# Patient Record
Sex: Male | Born: 1960 | Race: Black or African American | Hispanic: No | Marital: Single
Health system: Midwestern US, Community
[De-identification: ages and names within clinical notes are randomized; demographics above are authoritative.]

## PROBLEM LIST (undated history)

## (undated) DIAGNOSIS — I10 Essential (primary) hypertension: Secondary | ICD-10-CM

## (undated) DIAGNOSIS — F329 Major depressive disorder, single episode, unspecified: Secondary | ICD-10-CM

## (undated) DIAGNOSIS — E785 Hyperlipidemia, unspecified: Secondary | ICD-10-CM

## (undated) DIAGNOSIS — G629 Polyneuropathy, unspecified: Secondary | ICD-10-CM

## (undated) DIAGNOSIS — T7840XA Allergy, unspecified, initial encounter: Secondary | ICD-10-CM

## (undated) DIAGNOSIS — F429 Obsessive-compulsive disorder, unspecified: Secondary | ICD-10-CM

## (undated) DIAGNOSIS — F32A Depression, unspecified: Secondary | ICD-10-CM

## (undated) HISTORY — PX: CIRCUMCISION: SUR203

## (undated) HISTORY — DX: Obsessive-compulsive disorder, unspecified: F42.9

## (undated) HISTORY — DX: Allergy, unspecified, initial encounter: T78.40XA

## (undated) HISTORY — DX: Hyperlipidemia, unspecified: E78.5

---

## 2004-08-30 ENCOUNTER — Emergency Department (HOSPITAL_COMMUNITY): Admission: EM | Admit: 2004-08-30 | Discharge: 2004-08-30 | Payer: Self-pay | Admitting: Emergency Medicine

## 2004-09-01 ENCOUNTER — Ambulatory Visit: Payer: Self-pay | Admitting: Family Medicine

## 2004-09-05 ENCOUNTER — Ambulatory Visit: Payer: Self-pay | Admitting: Family Medicine

## 2005-03-06 ENCOUNTER — Ambulatory Visit: Payer: Self-pay | Admitting: Internal Medicine

## 2005-03-20 ENCOUNTER — Inpatient Hospital Stay (HOSPITAL_COMMUNITY): Admission: RE | Admit: 2005-03-20 | Discharge: 2005-03-29 | Payer: Self-pay | Admitting: Psychiatry

## 2005-03-20 ENCOUNTER — Ambulatory Visit: Payer: Self-pay | Admitting: Psychiatry

## 2005-06-23 ENCOUNTER — Inpatient Hospital Stay (HOSPITAL_COMMUNITY): Admission: EM | Admit: 2005-06-23 | Discharge: 2005-06-29 | Payer: Self-pay | Admitting: Psychiatry

## 2005-06-23 ENCOUNTER — Ambulatory Visit: Payer: Self-pay | Admitting: Psychiatry

## 2006-03-14 ENCOUNTER — Ambulatory Visit: Payer: Self-pay | Admitting: Family Medicine

## 2006-04-02 ENCOUNTER — Emergency Department (HOSPITAL_COMMUNITY): Admission: EM | Admit: 2006-04-02 | Discharge: 2006-04-02 | Payer: Self-pay | Admitting: Emergency Medicine

## 2006-05-02 ENCOUNTER — Ambulatory Visit: Payer: Self-pay | Admitting: Family Medicine

## 2006-05-13 ENCOUNTER — Emergency Department (HOSPITAL_COMMUNITY): Admission: EM | Admit: 2006-05-13 | Discharge: 2006-05-13 | Payer: Self-pay | Admitting: *Deleted

## 2007-03-17 ENCOUNTER — Other Ambulatory Visit: Payer: Self-pay | Admitting: Emergency Medicine

## 2007-03-17 ENCOUNTER — Other Ambulatory Visit: Payer: Self-pay

## 2007-03-18 ENCOUNTER — Ambulatory Visit: Payer: Self-pay | Admitting: Psychiatry

## 2007-03-18 ENCOUNTER — Inpatient Hospital Stay (HOSPITAL_COMMUNITY): Admission: AD | Admit: 2007-03-18 | Discharge: 2007-03-25 | Payer: Self-pay | Admitting: Psychiatry

## 2007-03-18 ENCOUNTER — Other Ambulatory Visit: Payer: Self-pay | Admitting: Emergency Medicine

## 2007-10-21 ENCOUNTER — Emergency Department (HOSPITAL_COMMUNITY): Admission: EM | Admit: 2007-10-21 | Discharge: 2007-10-21 | Payer: Self-pay | Admitting: Emergency Medicine

## 2009-05-01 LAB — GENERAL HEALTH PANEL

## 2010-02-23 DIAGNOSIS — F32A Depression, unspecified: Secondary | ICD-10-CM | POA: Insufficient documentation

## 2010-02-26 DIAGNOSIS — I10 Essential (primary) hypertension: Secondary | ICD-10-CM | POA: Diagnosis present

## 2010-02-26 DIAGNOSIS — G4733 Obstructive sleep apnea (adult) (pediatric): Secondary | ICD-10-CM | POA: Insufficient documentation

## 2010-02-26 DIAGNOSIS — E669 Obesity, unspecified: Secondary | ICD-10-CM | POA: Insufficient documentation

## 2010-02-26 DIAGNOSIS — E1142 Type 2 diabetes mellitus with diabetic polyneuropathy: Secondary | ICD-10-CM | POA: Insufficient documentation
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
## 2010-12-30 NOTE — H&P (Signed)
[name], [name]                 ACCOUNT NO.:  [id number]   MEDICAL RECORD NO.:  [id number]          PATIENT TYPE:  IPS   LOCATION:  0303                          FACILITY:  BH   PHYSICIAN:  Anselm Jungling, MD  DATE OF BIRTH:  May 26, 1961   DATE OF ADMISSION:  06/23/2005  DATE OF DISCHARGE:                         PSYCHIATRIC ADMISSION ASSESSMENT   IDENTIFYING INFORMATION:  This is a voluntary admission to the services of  Dr. Geralyn Flash.  This is a 50 year old single African-American male.  Per [facility], he was found at the [facility].  He had called 911  and reported being suicidal.  He stated that this happens when he gets off  his Paxil.  He admitted to having visual hallucinations but not auditory  hallucinations yesterday.  He was brought to the emergency room at [facility] for medical clearance.  His UDS was negative.  His alcohol level was  less than 5.  His chemistry showed his glucose to be elevated at 140 and his  AST was slightly elevated at 39 and SGPT was also slightly elevated at 41.  The patient states he came here from the [place] area approximately a  year ago.  He came here to get a part-time job.  He has been on SSI for his  mental illness.  He was described as being OCD back in 1994.  He has had a  number of prior inpatient hospitalizations at a variety of institutions  including back in 2005, he was at the [place], [facility] as well as  [facility], a year ago he was at mental health in [place]  and he was an outpatient at the [facility] there and, in this past year,  he has been followed sporadically at [facility] and he  has had one prior admission here at the [facility] and that  was from August 7th to August 16th.   SOCIAL HISTORY:  He states that he graduated high school in 1984.  He began  receiving SSI for mental disability in 1994.  He denies ever being married.  He has no  children.  He states most of his family is in [place], [place].  He acknowledges that he has one brother who used to use drugs but  he thinks he is off of them now.  He, himself, denies any history for  alcohol or drug use.   PRIMARY CARE PHYSICIAN:  He currently does not have one.   MEDICAL PROBLEMS:  He is obese.  He acknowledges having back pain at times.   MEDICATIONS:  When he was here in August, his discharge medications included  Paxil CR 12.5 mg p.o. q.d., Depakote ER 1000 mg b.i.d. and Seroquel 400 mg  q.h.s.  The patient acknowledges that he never had his post-discharge  medications filled and basically has been noncompliant with medications for  the past four months.   ALLERGIES:  He has no known drug allergies.   POSITIVE PHYSICAL FINDINGS:  He is obese.  He weighs 228 pounds.  He is 5  feet 9 inches.  Temperature is 97.3, blood pressure is 134/94, 123/84, pulse  is 90, respirations are 20.  Interestingly, he has different colored eyes.  His right eye is blue.  His left eye is brown.   MENTAL STATUS EXAM:  He is alert and oriented x3.  He is appropriately  groomed, dressed and appears adequately nourished.  His speech is a normal  rate, rhythm and tone.  His mood is euthymic.  His affect is congruent.  Has  a good range.  Thought processes are clear, logical and goal-oriented.  He  realizes that he probably needs to get into a more formal housing  arrangement and brings up the option of a group home or a family home  himself.  Judgment and insight are fair.  Concentration and memory are  intact.  Intelligence is at least average. He states, today, that the  suicidal ideation has decreased although it has not yet gone.  He denies  homicidal ideation and he absolutely denies auditory or visual  hallucinations.   DIAGNOSES:  AXIS I:  Major depressive disorder, recurrent, severe.  Noncompliant with medications.  Obsessive-compulsive disorder.  Has a  handwashing  ritual.  AXIS II:  Deferred.  AXIS III:  Obesity, back pain, and his glucose level was noted to be  elevated on admission.  We will have to evaluate that.  AXIS IV:  Severe (he has limited income and no housing).  AXIS V:  35.   PLAN:  To admit for stabilization.  To restart medications.  To adjust as  indicated.  To have the social worker work with him regarding placement.      Mickie Leonarda Salon, P.A.-C.      Anselm Jungling, MD  Electronically Signed    MD/MEDQ  D:  06/23/2005  T:  06/23/2005  Job:  [id number]

## 2010-12-30 NOTE — Discharge Summary (Signed)
[name], CHOQUETTE                 ACCOUNT NO.:  [id number]   MEDICAL RECORD NO.:  [id number]          PATIENT TYPE:  IPS   LOCATION:  0303                          FACILITY:  BH   PHYSICIAN:  Anselm Jungling, MD  DATE OF BIRTH:  May 26, 1961   DATE OF ADMISSION:  06/23/2005  DATE OF DISCHARGE:  06/29/2005                                 DISCHARGE SUMMARY   IDENTIFYING DATA/REASON FOR ADMISSION:  This was the second [facility] admission  for [name], a 50 year old single African-American male who was admitted on a  voluntary basis. He was found at the [facility] where he had called 9-1-  1 and reported being suicidal. He explained that this happened because he  got off his Paxil. He reported visual but not auditory hallucinations. He  was brought to the emergency room at [facility] for medical clearance where  his UDS was negative. From there, he was brought to the inpatient  psychiatric service for further treatment. Please refer to the admission  note for further details pertaining to the symptoms, circumstances and  history that led to his hospitalization.   INITIAL DIAGNOSTIC IMPRESSION:  He was given an initial AXIS I diagnosis of  major depressive disorder, recurrent, severe, and rule out obsessive-  compulsive disorder.   He came to [place] on a regimen that involved Depakote, Paxil, and Ativan.   MEDICAL/LABORATORY:  The patient has obesity, back pain, and appeared to  have some glucose intolerance upon laboratory evaluation. He was given a  modified carbohydrate diet for this. There were no significant medical  issues otherwise.   HOSPITAL COURSE:  The patient was admitted to the adult inpatient  psychiatric service. He participated in various therapeutic groups and  activities, with variable levels of interest, motivation, and at times  refused to attend groups for no specific reason.   He was moderately irritable upon admission, and did not have any overt signs  or symptoms  of psychosis or thought disorder, despite the above referenced  complaints that he made at the time of admission. He was not reporting or  exhibiting any suicidal thoughts or behavior throughout his inpatient stay.   He was continued on Depakote ER 500 mg daily and Ativan 0.5 mg p.r.n. We  discussed a trial of Cymbalta to address depressive symptoms, to be taken  instead of Paxil. After several days, he indicated that he felt that he  needed to have his obsessive-compulsive symptoms addressed and, to this end,  we discontinued the trial of Cymbalta in favor of a trial of Zoloft, because  of Zoloft's greater antiobsessional properties. He tolerated a dose of  Zoloft 50 mg daily fairly well.   The patient was discharged on the seventh hospital day. He was in good  spirits. He was tolerating his medications. His sleep had stabilized, and  his mood was essentially neutral.   AFTERCARE:  The patient was to follow up with Dr. Derrek Gu on July 04, 2005.   DISCHARGE MEDICATIONS:  1.  Zoloft 50 mg daily.  2.  Depakote ER 500 mg daily.  3.  Ativan 0.5 mg p.r.n. anxiety up to twice a day.   DISCHARGE DIAGNOSES:  AXIS I:  Major depressive disorder, recurrent.  Obsessive compulsive disorder.  AXIS II:  Deferred.  AXIS III:  Mild glucose intolerance.  AXIS IV:  Stressors:  severe.  AXIS V:  GAF on discharge 65.           ______________________________  Anselm Jungling, MD  Electronically Signed     SPB/MEDQ  D:  06/30/2005  T:  06/30/2005  Job:  [id number]

## 2010-12-30 NOTE — Discharge Summary (Signed)
NAMESHLOMA, ROGGENKAMP                 ACCOUNT NO.:  [id number]   MEDICAL RECORD NO.:  [id number]          PATIENT TYPE:  IPS   LOCATION:  0404                          FACILITY:  [facility]   PHYSICIAN:  Anselm Jungling, MD  DATE OF BIRTH:  1960/12/06   DATE OF ADMISSION:  03/20/2005  DATE OF DISCHARGE:  03/29/2005                                 DISCHARGE SUMMARY   IDENTIFYING DATA/REASON FOR ADMISSION:  This was the first [facility] admission for  [name], a 50 year old male who was admitted due to increasing depression,  irritability, and suicidal ideation.  He had been receiving treatment at  [facility].  He had been taking Paxil CR 12.5 mg daily.  He came to [place] without any past history of polysubstance abuse.  Please refer  to the admission note for further details pertaining to the symptoms,  circumstances and history that led to his hospitalization at [facility].   INITIAL DIAGNOSTIC IMPRESSION:  AXIS I:  Depressive disorder not otherwise  specified.  Rule out bipolar affective disorder.  AXIS II:  Deferred.  AXIS III:  No acute or chronic illnesses.  AXIS IV:  Stressors:  Severe.  AXIS V:  GAF 35.   MEDICAL/LABORATORY:  A chemistry panel and urinalysis were within normal  limits.  CBC was within normal limits with the exception of reduced  hematocrit of 38.8.  TSH was within normal limits.  A toxicology screen was  negative.   HOSPITAL COURSE:  The patient was admitted to the adult inpatient  psychiatric service.  He was continued on Paxil CR 12.5 mg daily.  To  address mood instability and irritability, he was begun on a trial of  Depakote ER 250 mg b.i.d., which was subsequently increased stepwise to an  ultimate dose of 1000 mg b.i.d.  Seroquel 100 mg q.h.s. was initiated as  well to address agitation and irritability and was increased in a stepwise  fashion to an ultimate dose of 400 mg q.h.s.  Rozerem 8 mg q.h.s. was  initiated to address insomnia, without success, so it  was not continued.  Ativan 1 mg b.i.d. was also useful in addressing intermittent agitation and  anxiety.   During most of his 10-day hospital stay, the patient remained in bed,  preferring to withdraw and not participate in unit groups and activities.  He complained that he was extremely irritable.  Indeed, he was irritable  with the nursing staff, although he was generally polite and appropriate  with the undersigned.  On one occasion during his inpatient stay, he made  some sexually inappropriate behaviors towards a male peer.  Strong verbal  limits were set with the patient, which he then followed thereafter.   As his hospital course progressed, he became gradually less irritable.  His  participation remained variable and generally poor.   On the 10th hospital day, the patient was absent any acute symptoms.  He  appeared to be improved overall.  He was tolerating his medication regimen.  He agreed to discharge.   AFTERCARE:  The patient was to call [name]  to receive assistance with  housing and follow-up appointments at [facility] in [place],  [place].   DISCHARGE MEDICATIONS:  1.  Paxil CR 12.5 mg daily.  2.  Depakote ER 1000 mg b.i.d.  3.  Seroquel 400 mg q.h.s.   DISCHARGE DIAGNOSES:  AXIS I:  Bipolar affective disorder, not otherwise  specified.  AXIS II:  Deferred.  AXIS III:  No acute or chronic illnesses.  AXIS IV:  Stressors:  Severe.  AXIS V:  GAF on discharge 55.           ______________________________  Anselm Jungling, MD  Electronically Signed     SPB/MEDQ  D:  04/20/2005  T:  04/21/2005  Job:  [id number]

## 2011-01-30 ENCOUNTER — Emergency Department (HOSPITAL_COMMUNITY)
Admission: EM | Admit: 2011-01-30 | Discharge: 2011-01-30 | Disposition: A | Discharge status: Home / Self Care | Payer: Medicaid Other | Attending: Emergency Medicine | Admitting: Emergency Medicine

## 2011-01-30 ENCOUNTER — Emergency Department (HOSPITAL_COMMUNITY): Payer: Medicaid Other

## 2011-01-30 DIAGNOSIS — R059 Cough, unspecified: Secondary | ICD-10-CM | POA: Insufficient documentation

## 2011-01-30 DIAGNOSIS — E119 Type 2 diabetes mellitus without complications: Secondary | ICD-10-CM | POA: Insufficient documentation

## 2011-01-30 DIAGNOSIS — R05 Cough: Secondary | ICD-10-CM | POA: Insufficient documentation

## 2011-01-30 DIAGNOSIS — Z79899 Other long term (current) drug therapy: Secondary | ICD-10-CM | POA: Insufficient documentation

## 2011-01-30 DIAGNOSIS — F3289 Other specified depressive episodes: Secondary | ICD-10-CM | POA: Insufficient documentation

## 2011-01-30 DIAGNOSIS — F329 Major depressive disorder, single episode, unspecified: Secondary | ICD-10-CM | POA: Insufficient documentation

## 2011-01-30 DIAGNOSIS — J40 Bronchitis, not specified as acute or chronic: Secondary | ICD-10-CM | POA: Insufficient documentation

## 2011-01-30 DIAGNOSIS — I10 Essential (primary) hypertension: Secondary | ICD-10-CM | POA: Insufficient documentation

## 2011-02-15 ENCOUNTER — Emergency Department (HOSPITAL_COMMUNITY)
Admission: EM | Admit: 2011-02-15 | Discharge: 2011-02-15 | Disposition: A | Discharge status: Home / Self Care | Payer: Medicare Other | Attending: Emergency Medicine | Admitting: Emergency Medicine

## 2011-02-15 DIAGNOSIS — I10 Essential (primary) hypertension: Secondary | ICD-10-CM | POA: Insufficient documentation

## 2011-02-15 DIAGNOSIS — Z79899 Other long term (current) drug therapy: Secondary | ICD-10-CM | POA: Insufficient documentation

## 2011-02-15 DIAGNOSIS — F3289 Other specified depressive episodes: Secondary | ICD-10-CM | POA: Insufficient documentation

## 2011-02-15 DIAGNOSIS — E119 Type 2 diabetes mellitus without complications: Secondary | ICD-10-CM | POA: Insufficient documentation

## 2011-02-15 DIAGNOSIS — Z760 Encounter for issue of repeat prescription: Secondary | ICD-10-CM | POA: Insufficient documentation

## 2011-02-15 DIAGNOSIS — F329 Major depressive disorder, single episode, unspecified: Secondary | ICD-10-CM | POA: Insufficient documentation

## 2011-04-29 DIAGNOSIS — E1169 Type 2 diabetes mellitus with other specified complication: Secondary | ICD-10-CM | POA: Insufficient documentation

## 2011-05-29 LAB — CBC
HCT: 37 — ABNORMAL LOW
Hemoglobin: 12.4 — ABNORMAL LOW
MCV: 84.2
RDW: 14.1 — ABNORMAL HIGH
WBC: 9.3

## 2011-05-29 LAB — I-STAT 8, (EC8 V) (CONVERTED LAB)
Acid-base deficit: 1
Chloride: 105
Hemoglobin: 13.6
Potassium: 3.9
Sodium: 140
pH, Ven: 7.361 — ABNORMAL HIGH

## 2011-05-29 LAB — URINALYSIS, ROUTINE W REFLEX MICROSCOPIC
Bilirubin Urine: NEGATIVE
Glucose, UA: NEGATIVE
Protein, ur: NEGATIVE

## 2011-05-29 LAB — DIFFERENTIAL
Lymphs Abs: 2.6
Monocytes Absolute: 0.9 — ABNORMAL HIGH
Neutro Abs: 5.4
Neutrophils Relative %: 58

## 2011-05-29 LAB — POCT I-STAT CREATININE: Operator id: 277751

## 2011-05-29 LAB — TSH: TSH: 2.567

## 2011-05-29 LAB — HEPATIC FUNCTION PANEL

## 2011-05-29 LAB — ETHANOL: Alcohol, Ethyl (B): <5

## 2011-05-29 LAB — RAPID URINE DRUG SCREEN, HOSP PERFORMED: Opiates: NOT DETECTED

## 2011-08-28 IMAGING — CR DG CHEST 2V
2 series · 2 of 2 positions shown · non-contrast
Comparison: 04/02/2006

CLINICAL DATA: Dry cough for 1 month.  Nonsmoker.  Hypertension.
Type 2 diabetes.

CHEST - 2 VIEW

[w chest pa]
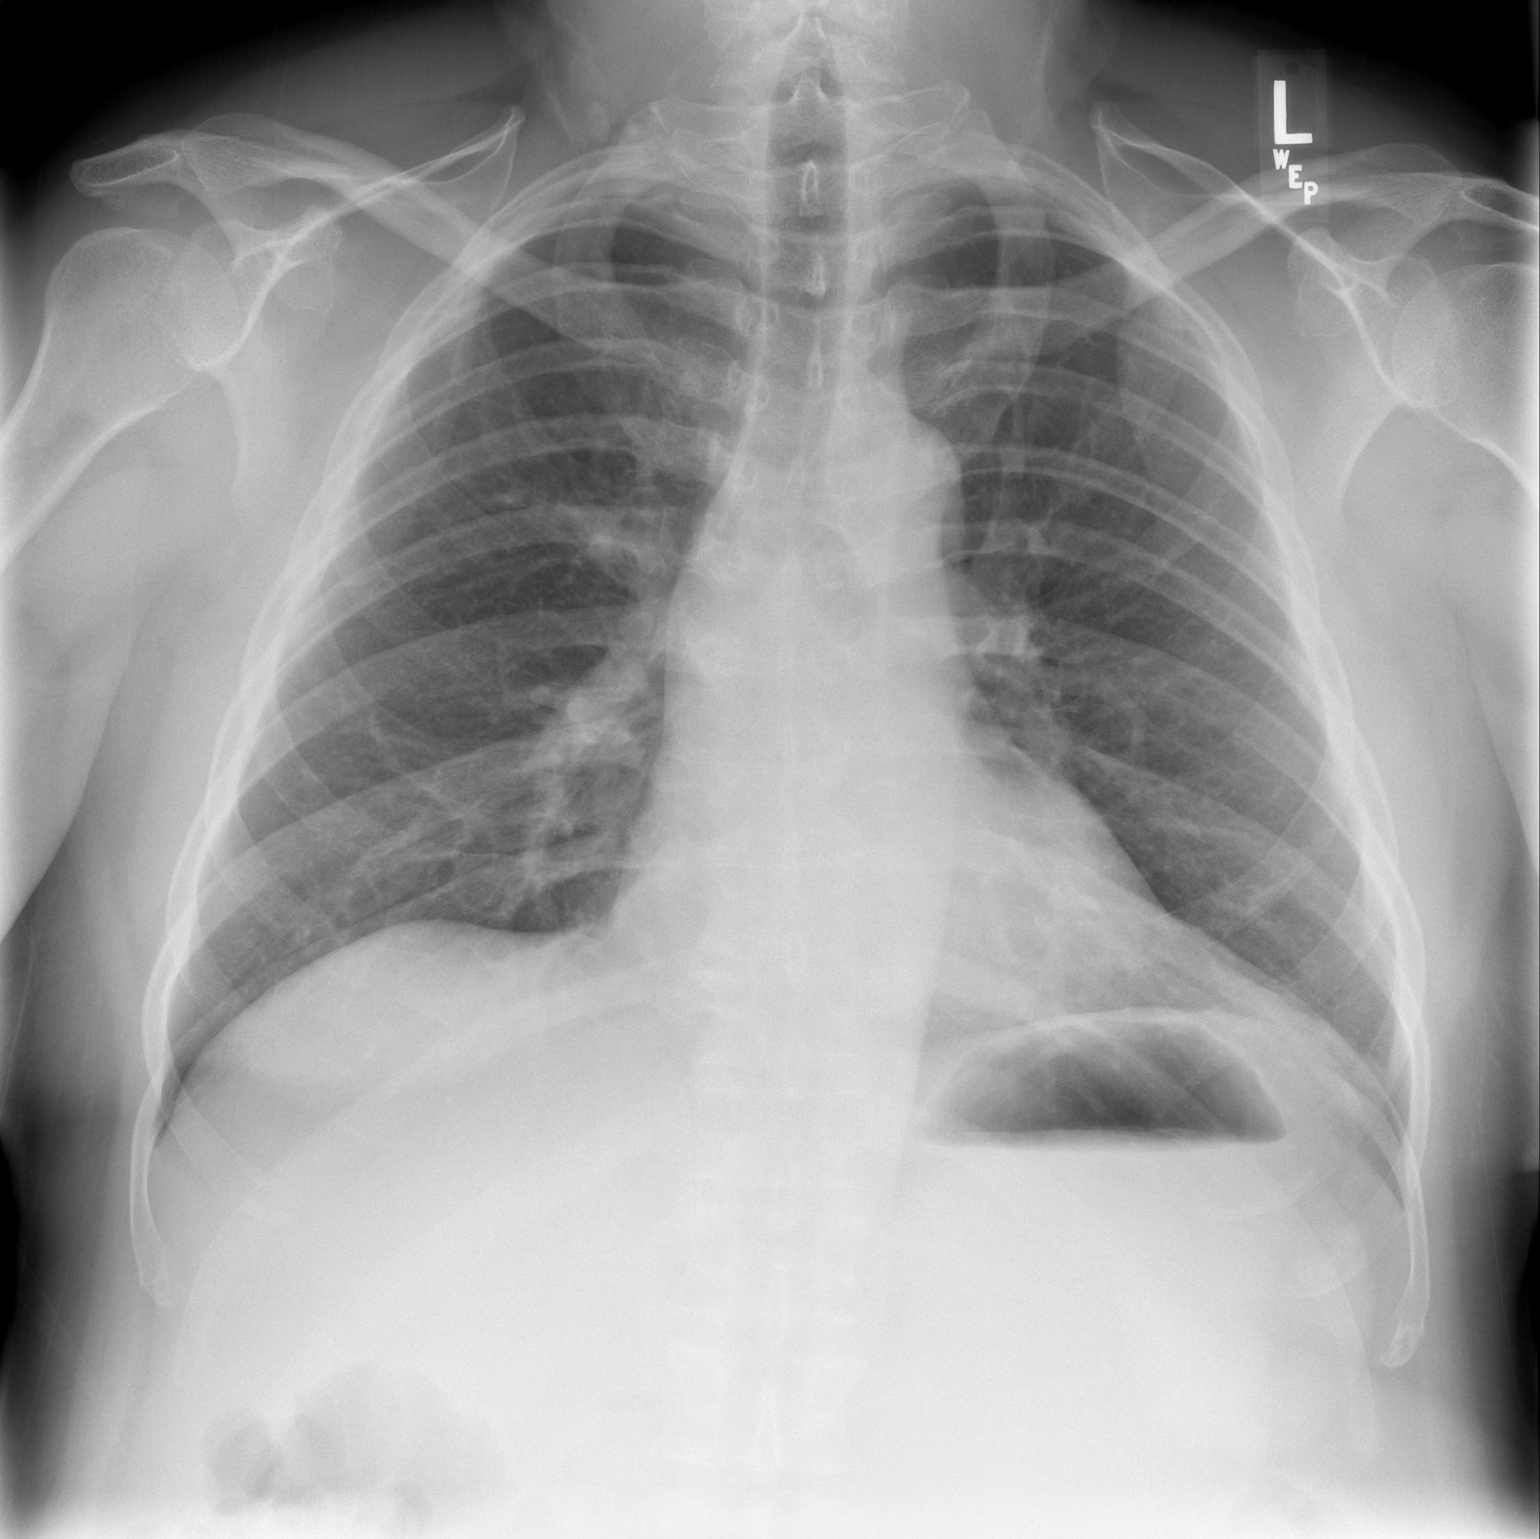

[w chest lat]
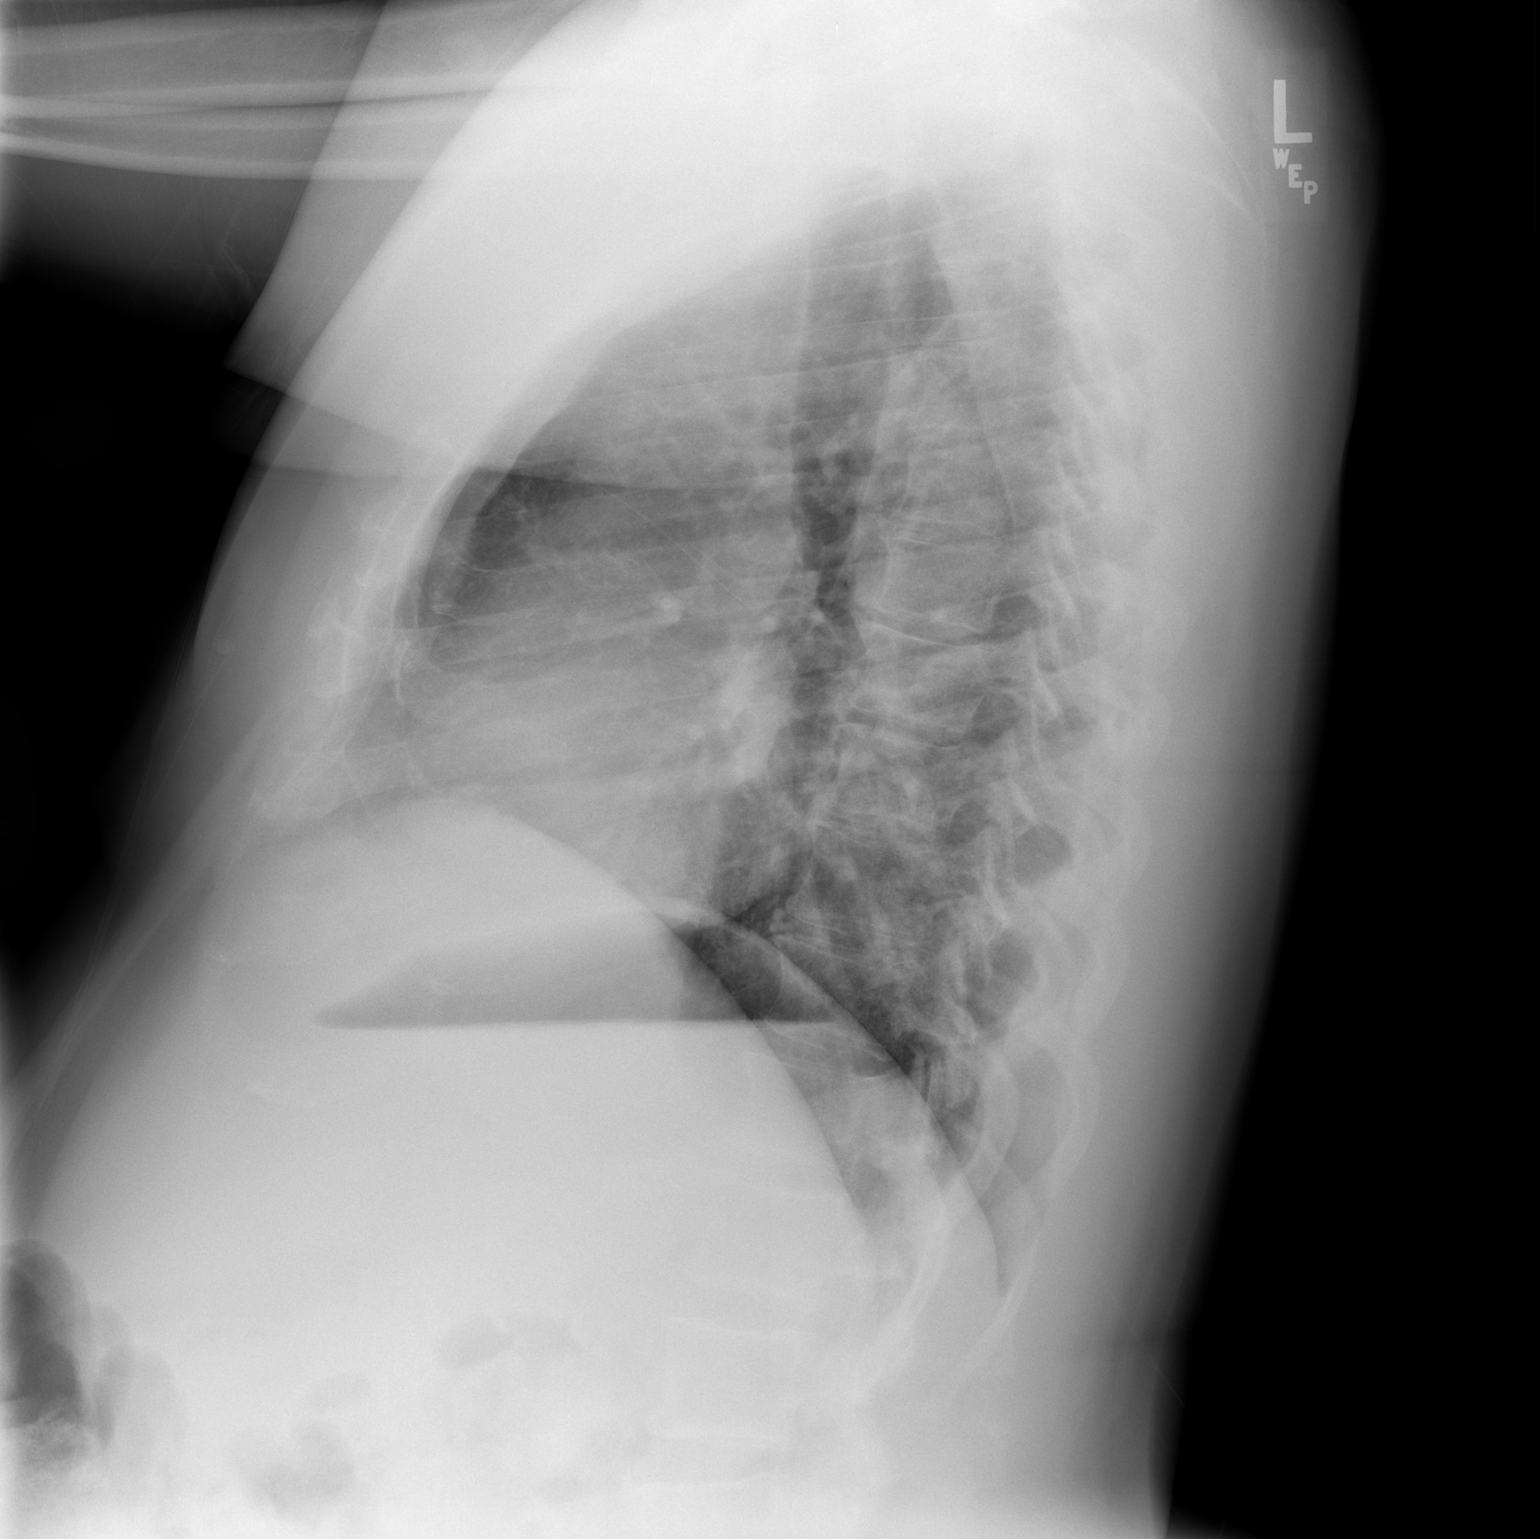

[2 of 2 positions shown; findings below may reference images not displayed]

FINDINGS: Heart and mediastinal contours are within normal limits.
The lung fields appear clear with no signs of focal infiltrate or
congestive failure.  No pleural fluid or peribronchial cuffing is
seen.

Bony structures demonstrate degenerative osteophytosis of the
thoracic spine and are otherwise intact.  A well-circumscribed
radiographic density is identified overlying the soft tissues of
the right neck and has a appearance suspicious for an extrinsic
button.
IMPRESSION: Stable cardiopulmonary appearance with no new focal or acute
abnormality.

## 2011-09-05 ENCOUNTER — Emergency Department (HOSPITAL_COMMUNITY)
Admission: EM | Admit: 2011-09-05 | Discharge: 2011-09-07 | Disposition: A | Discharge status: Other Acute Inpatient Hospital | Payer: Medicare Other | Source: Home / Self Care | Attending: Emergency Medicine | Admitting: Emergency Medicine

## 2011-09-05 ENCOUNTER — Encounter (HOSPITAL_COMMUNITY): Payer: Self-pay | Admitting: Emergency Medicine

## 2011-09-05 DIAGNOSIS — I10 Essential (primary) hypertension: Secondary | ICD-10-CM | POA: Insufficient documentation

## 2011-09-05 DIAGNOSIS — R45851 Suicidal ideations: Secondary | ICD-10-CM | POA: Insufficient documentation

## 2011-09-05 DIAGNOSIS — E119 Type 2 diabetes mellitus without complications: Secondary | ICD-10-CM | POA: Insufficient documentation

## 2011-09-05 DIAGNOSIS — R4589 Other symptoms and signs involving emotional state: Secondary | ICD-10-CM

## 2011-09-05 DIAGNOSIS — G479 Sleep disorder, unspecified: Secondary | ICD-10-CM | POA: Insufficient documentation

## 2011-09-05 HISTORY — DX: Polyneuropathy, unspecified: G62.9

## 2011-09-05 HISTORY — DX: Essential (primary) hypertension: I10

## 2011-09-05 LAB — DIFFERENTIAL
Basophils Relative: 0 % (ref 0–1)
Eosinophils Relative: 2 % (ref 0–5)
Lymphocytes Relative: 29 % (ref 12–46)
Lymphs Abs: 2.6 10*3/uL (ref 0.7–4.0)
Monocytes Absolute: 0.7 10*3/uL (ref 0.1–1.0)

## 2011-09-05 LAB — CBC
HCT: 37.4 % — ABNORMAL LOW (ref 39.0–52.0)
Hemoglobin: 13.1 g/dL (ref 13.0–17.0)
MCH: 28 pg (ref 26.0–34.0)
MCV: 79.9 fL (ref 78.0–100.0)
WBC: 9 10*3/uL (ref 4.0–10.5)

## 2011-09-05 LAB — BASIC METABOLIC PANEL
GFR calc Af Amer: >90 mL/min (ref >90)
GFR calc non Af Amer: >90 mL/min (ref >90)
Sodium: 138 mEq/L (ref 135–145)

## 2011-09-05 NOTE — ED Notes (Signed)
PT. REPORTS SUICIDAL IDEATION , PLANS TO CROSS HIGHWAY AND TO BE HIT BY A TRUCK.

## 2011-09-06 LAB — GLUCOSE, CAPILLARY
Glucose-Capillary: 127 mg/dL — ABNORMAL HIGH (ref 70–99)
Glucose-Capillary: 140 mg/dL — ABNORMAL HIGH (ref 70–99)

## 2011-09-06 LAB — RAPID URINE DRUG SCREEN, HOSP PERFORMED
Barbiturates: NOT DETECTED
Benzodiazepines: NOT DETECTED
Opiates: NOT DETECTED

## 2011-09-06 MED ORDER — TRAZODONE HCL 150 MG PO TABS
150.0000 mg | ORAL_TABLET | ORAL | Status: DC
  Filled 2011-09-06: qty 1

## 2011-09-06 MED ORDER — INSULIN ASPART 100 UNIT/ML ~~LOC~~ SOLN
0.0000 [IU] | Freq: Three times a day (TID) | SUBCUTANEOUS | Status: DC
  Administered 2011-09-06: 2 [IU] via SUBCUTANEOUS
  Administered 2011-09-06 (×2): 0 [IU] via SUBCUTANEOUS
  Filled 2011-09-06 (×2): qty 1

## 2011-09-06 MED ORDER — IBUPROFEN 200 MG PO TABS: 600.0000 mg | ORAL_TABLET | Freq: Three times a day (TID) | ORAL | Status: DC | PRN

## 2011-09-06 MED ORDER — TRAZODONE HCL 150 MG PO TABS
150.0000 mg | ORAL_TABLET | Freq: Every day | ORAL | Status: DC
  Administered 2011-09-06: 150 mg via ORAL
  Filled 2011-09-06: qty 1

## 2011-09-06 MED ORDER — TRAZODONE HCL 150 MG PO TABS: 150.0000 mg | ORAL_TABLET | Freq: Every day | ORAL | Status: DC

## 2011-09-06 MED ORDER — ZOLPIDEM TARTRATE 5 MG PO TABS
5.0000 mg | ORAL_TABLET | Freq: Every evening | ORAL | Status: DC | PRN
  Filled 2011-09-06: qty 1

## 2011-09-06 MED ORDER — METFORMIN HCL 500 MG PO TABS: 500.0000 mg | ORAL_TABLET | Freq: Two times a day (BID) | ORAL | Status: DC

## 2011-09-06 MED ORDER — PREGABALIN 75 MG PO CAPS
75.0000 mg | ORAL_CAPSULE | Freq: Three times a day (TID) | ORAL | Status: DC
  Administered 2011-09-06: 75 mg via ORAL

## 2011-09-06 MED ORDER — ONDANSETRON HCL 8 MG PO TABS: 4.0000 mg | ORAL_TABLET | Freq: Three times a day (TID) | ORAL | Status: DC | PRN

## 2011-09-06 MED ORDER — METFORMIN HCL 500 MG PO TABS
500.0000 mg | ORAL_TABLET | ORAL | Status: AC
  Administered 2011-09-06: 500 mg via ORAL
  Filled 2011-09-06: qty 1

## 2011-09-06 MED ORDER — METOPROLOL TARTRATE 25 MG PO TABS
100.0000 mg | ORAL_TABLET | Freq: Every day | ORAL | Status: DC
  Administered 2011-09-06: 100 mg via ORAL

## 2011-09-06 MED ORDER — LORAZEPAM 1 MG PO TABS
1.0000 mg | ORAL_TABLET | Freq: Three times a day (TID) | ORAL | Status: DC | PRN
  Administered 2011-09-06 (×2): 1 mg via ORAL
  Filled 2011-09-06 (×2): qty 1

## 2011-09-06 MED ORDER — GLIPIZIDE 10 MG PO TABS
10.0000 mg | ORAL_TABLET | ORAL | Status: AC
  Administered 2011-09-06: 10 mg via ORAL
  Filled 2011-09-06: qty 1

## 2011-09-06 MED ORDER — PREGABALIN 75 MG PO CAPS
75.0000 mg | ORAL_CAPSULE | ORAL | Status: DC
  Filled 2011-09-06: qty 1

## 2011-09-06 MED ORDER — PREGABALIN 75 MG PO CAPS
75.0000 mg | ORAL_CAPSULE | ORAL | Status: AC
Start: 2011-09-06 — End: 2011-09-06
  Administered 2011-09-06: 75 mg via ORAL
  Filled 2011-09-06: qty 1

## 2011-09-06 MED ORDER — METOPROLOL TARTRATE 25 MG PO TABS
100.0000 mg | ORAL_TABLET | Freq: Two times a day (BID) | ORAL | Status: DC
  Administered 2011-09-06 (×2): 100 mg via ORAL
  Filled 2011-09-06 (×2): qty 4

## 2011-09-06 MED ORDER — ACETAMINOPHEN 325 MG PO TABS: 650.0000 mg | ORAL_TABLET | ORAL | Status: DC | PRN

## 2011-09-06 MED ORDER — ALUM & MAG HYDROXIDE-SIMETH 200-200-20 MG/5ML PO SUSP: 30.0000 mL | ORAL | Status: DC | PRN

## 2011-09-06 NOTE — ED Notes (Signed)
Pt ambulated to the bathroom. No current complaints. Waiting on tele psych conference.

## 2011-09-06 NOTE — ED Notes (Signed)
Received report from Katie RN.

## 2011-09-06 NOTE — ED Notes (Signed)
Pt has $23.00 on his bedside table (1-$10 bill, 1-$5 bill and 8-$1 bills), a yellow colored watch and glasses.  I attempted to place pt money and watch in locker and pt became very upset and stated that he wanted the money and watch with him.

## 2011-09-06 NOTE — ED Notes (Signed)
CBG is 140 RN [employer].

## 2011-09-06 NOTE — BH Assessment (Signed)
Assessment Note   [name] is an 51 y.o. male.  [facility] came to [facility] because he was having thoughts of killing himself.  He has felt this way for the last 3 days with today being the worst.  His plan is to jump in front of a truck.  [name] had one previous suicide attempt years ago.  He denies any HI or A/V hallucinations at this time.  [name] also denies SA issues and has a clean UDS.  [name] has no current psychiatric care and says that he gets his prescriptions refilled by going to urgent care locations.  He currently is homeless.  He currently does not feel that he would be safe on his own.  [name] is in need of inpatient psychiatric care. Axis I: Depressive Disorder NOS Axis II: Deferred Axis III:  Past Medical History  Diagnosis Date  . Hypertension   . Diabetes mellitus   . Constipation   . Neuropathy    Axis IV: housing problems and problems with primary support group Axis V: 31-40 impairment in reality testing  Past Medical History:  Past Medical History  Diagnosis Date  . Hypertension   . Diabetes mellitus   . Constipation   . Neuropathy     History reviewed. No pertinent past surgical history.  Family History: No family history on file.  Social History:  reports that he has never smoked. He does not have any smokeless tobacco history on file. He reports that he does not drink alcohol or use illicit drugs.  Additional Social History:  Alcohol / Drug Use Pain Medications: None Prescriptions: Metropolol 100 mg qd; Metformin 500 mg 2x/D; Glipicide 10 mg qd; Lyrica 75 mg 3x/D; Trazadone 150 mg qhs, Roserim (sp?) dose unknown; colace also. Over the Counter: Unknown History of alcohol / drug use?: No history of alcohol / drug abuse Longest period of sobriety (when/how long): N/A Allergies:  Allergies  Allergen Reactions  . Ace Inhibitors Other (See Comments)    cough    Home Medications:  Medications Prior to Admission  Medication Dose Route Frequency Provider Last  Rate Last Dose  . acetaminophen (TYLENOL) tablet 650 mg  650 mg Oral Q4H PRN Dione Booze, MD      . alum & mag hydroxide-simeth (MAALOX/MYLANTA) 200-200-20 MG/5ML suspension 30 mL  30 mL Oral PRN Dione Booze, MD      . glipiZIDE (GLUCOTROL) tablet 10 mg  10 mg Oral BID AC Dione Booze, MD      . ibuprofen (ADVIL,MOTRIN) tablet 600 mg  600 mg Oral Q8H PRN Dione Booze, MD      . insulin aspart (novoLOG) injection 0-15 Units  0-15 Units Subcutaneous TID WC Dione Booze, MD      . LORazepam (ATIVAN) tablet 1 mg  1 mg Oral Q8H PRN Dione Booze, MD      . metFORMIN (GLUCOPHAGE) tablet 500 mg  500 mg Oral BID WC Dione Booze, MD      . metoprolol tartrate (LOPRESSOR) tablet 100 mg  100 mg Oral BID Dione Booze, MD      . ondansetron [facility]) tablet 4 mg  4 mg Oral Q8H PRN Dione Booze, MD      . pregabalin (LYRICA) capsule 75 mg  75 mg Oral TID Dione Booze, MD      . traZODone (DESYREL) tablet 150 mg  150 mg Oral QHS Dione Booze, MD      . zolpidem [facility]) tablet 5 mg  5 mg Oral QHS PRN Onalee Hua  Preston Fleeting, MD       No current outpatient prescriptions on file as of 09/05/2011.    OB/GYN Status:  No LMP for male patient.  General Assessment Data Location of Assessment: [facility] ED Living Arrangements: Homeless Can pt return to current living arrangement?: Yes Admission Status: Voluntary Is patient capable of signing voluntary admission?: Yes Transfer from: Acute Hospital Referral Source: Self/Family/Friend     Risk to self Suicidal Ideation: Yes-Currently Present Suicidal Intent: Yes-Currently Present Is patient at risk for suicide?: Yes Suicidal Plan?: Yes-Currently Present Specify Current Suicidal Plan: Jump in front of a truck Access to Means: Yes Specify Access to Suicidal Means: Streets, highways, byways What has been your use of drugs/alcohol within the last 12 months?: Denies current use Previous Attempts/Gestures: Yes How many times?: 1  Other Self Harm Risks: None Triggers for Past Attempts:  Family contact Intentional Self Injurious Behavior: None Family Suicide History: Unknown Recent stressful life event(s): Conflict (Comment) (People wanting to fight him.  Conflict at a boarding house) Persecutory voices/beliefs?: No Depression: Yes Depression Symptoms: Despondent;Isolating;Feeling worthless/self pity;Insomnia Substance abuse history and/or treatment for substance abuse?: No Suicide prevention information given to non-admitted patients: Not applicable  Risk to Others Homicidal Ideation: No Thoughts of Harm to Others: No Current Homicidal Intent: No-Not Currently/Within Last 6 Months Current Homicidal Plan: No-Not Currently/Within Last 6 Months Access to Homicidal Means: No Identified Victim: No one History of harm to others?: No Assessment of Violence: None Noted Violent Behavior Description: None Does patient have access to weapons?: No Criminal Charges Pending?: No Does patient have a court date: No  Psychosis Hallucinations: None noted Delusions: None noted  Mental Status Report Appear/Hygiene: Disheveled Eye Contact: Good Motor Activity: Unremarkable Speech: Logical/coherent Level of Consciousness: Quiet/awake Mood: Depressed Affect: Sad Anxiety Level: None Thought Processes: Coherent;Relevant Judgement: Impaired Orientation: Person;Place;Time;Situation Obsessive Compulsive Thoughts/Behaviors: None  Cognitive Functioning Concentration: Decreased Memory: Recent Intact;Remote Intact IQ: Average Insight: Fair Impulse Control: Poor Appetite: Fair Weight Loss: 0  Weight Gain: 0  Sleep: Decreased Total Hours of Sleep:  (<6H/D w/o meds) Vegetative Symptoms: None  Prior Inpatient Therapy Prior Inpatient Therapy: Yes Prior Therapy Dates: 2008? Prior Therapy Facilty/Provider(s): [facility] Reason for Treatment: Depression/SI  Prior Outpatient Therapy Prior Outpatient Therapy: No Prior Therapy Dates: Unknown Prior Therapy Facilty/Provider(s):  Unknown Reason for Treatment: N/A  ADL Screening (condition at time of admission) Patient's cognitive ability adequate to safely complete daily activities?: Yes Patient able to express need for assistance with ADLs?: Yes Independently performs ADLs?: Yes Weakness of Legs: None Weakness of Arms/Hands: None  Home Assistive Devices/Equipment Home Assistive Devices/Equipment: None    Abuse/Neglect Assessment (Assessment to be complete while patient is alone) Physical Abuse: Denies Verbal Abuse: Yes, past (Comment) (Did not elaborate) Sexual Abuse: Denies Exploitation of patient/patient's resources: Denies Self-Neglect: Denies          Additional Information 1:1 In Past 12 Months?: No CIRT Risk: No Elopement Risk: No Does patient have medical clearance?: Yes     Disposition:  Disposition Disposition of Patient: Inpatient treatment program Type of inpatient treatment program: Adult  On Site Evaluation by:   Reviewed with Physician:  Dr. Preston Fleeting at 01:36   Beatriz Stallion Ray 09/06/2011 3:22 AM

## 2011-09-06 NOTE — ED Notes (Signed)
Pt resting comfortably. Sitter at the bedside. Pt waiting on tele psych consult. Pt had specific request for lunch but got mis ordered and the patient did not receive fried chicken as requested. Pt assured that dinner would be ordered for him and we will make sure that he receives fried chicken. Pt remains in good spirits. No current complaints.

## 2011-09-06 NOTE — ED Notes (Signed)
CBG CHECKED BY EMT R [id number]

## 2011-09-06 NOTE — ED Notes (Signed)
CBG 124 

## 2011-09-06 NOTE — ED Notes (Signed)
PT request medications to be given at 10pm. States he is not ready to be drowsy yet

## 2011-09-06 NOTE — ED Notes (Signed)
Called pharmacy. They stated ok to hold lopressor until am.

## 2011-09-06 NOTE — ED Notes (Signed)
Pt resting comfortably. Sitter at the bedside.  

## 2011-09-06 NOTE — [facility] Counselor (Signed)
Pt has been accepted to [facility] adult unit room 502-1 by Dr Lolly Mustache to Dr. Dan Humphreys.  Support paperwork has been signed and faxed to [facility].

## 2011-09-06 NOTE — ED Notes (Signed)
CBG: 113 °

## 2011-09-06 NOTE — ED Notes (Signed)
Per ACT team. Pt is to be set up for tele psych. Paperwork completed and given to secretary Darl Pikes to call and fax info to appropriate facility.

## 2011-09-06 NOTE — ED Notes (Signed)
Pt. Alert and oriented, gait steady, transferred to [facility] with security

## 2011-09-06 NOTE — ED Provider Notes (Signed)
History     CSN: [id number]  Arrival date & time 09/05/11  2214   First MD Initiated Contact with Patient 09/05/11 2258      Chief Complaint  Patient presents with  . Suicidal    (Consider location/radiation/quality/duration/timing/severity/associated sxs/prior treatment) The history is provided by the patient.   51 year old male says that he started getting suicidal thoughts about 2 or 3 days ago, and it got worse today. He stopped taking his medications when he started feeling suicidal. His suicidal plan is to jump in front of a truck. He has had crying spells and sleep disturbance and anhedonia. He denies hallucinations. He wishes to signed himself in voluntarily. Symptoms are severe. Nothing makes it better and nothing makes it worse.  Past Medical History  Diagnosis Date  . Hypertension   . Diabetes mellitus   . Constipation   . Neuropathy     History reviewed. No pertinent past surgical history.  No family history on file.  History  Substance Use Topics  . Smoking status: Never Smoker   . Smokeless tobacco: Not on file  . Alcohol Use: No      Review of Systems  All other systems reviewed and are negative.    Allergies  Ace inhibitors  Home Medications   Current Outpatient Rx  Name Route Sig Dispense Refill  . DOCUSATE SODIUM-CASANTHRANOL 100-30 MG PO CAPS Oral Take 1 capsule by mouth 2 (two) times daily as needed. For constipation    . GLIPIZIDE 10 MG PO TABS Oral Take 10 mg by mouth 2 (two) times daily before a meal.    . METFORMIN HCL 500 MG PO TABS Oral Take 500 mg by mouth 2 (two) times daily with a meal.    . METOPROLOL TARTRATE 100 MG PO TABS Oral Take 100 mg by mouth 2 (two) times daily.    Marland Kitchen PREGABALIN 75 MG PO CAPS Oral Take 75 mg by mouth 3 (three) times daily.    . TRAZODONE HCL 150 MG PO TABS Oral Take 150 mg by mouth at bedtime.      BP 156/92  Pulse 87  Temp(Src) 97.8 F (36.6 C) (Oral)  Resp 20  SpO2 95%  Physical Exam  Nursing  note and vitals reviewed.  51 year old male who is resting comfortably and in no acute distress. Vital signs show mild hypertension blood pressure 156/92. Oxygen saturation is 95% which is normal. Head is normocephalic and atraumatic. PERRLA, EOMI. Oropharynx is clear. Neck is nontender and supple without adenopathy. Back is nontender. Lungs are clear without rales, wheezes, rhonchi. Heart has regular rate and rhythm without murmur. Abdomen is soft, flat, nontender without masses or hepatosplenomegaly. Extremities have no cyanosis or edema, full range of motion present. Skin is warm and moist without rash. Neurologic: Mental status is significant for depressed affect, cranial nerves are intact, there no focal motor or sensory deficits.  ED Course  Procedures (including critical care time)  Results for orders placed during the hospital encounter of 09/05/11  ETHANOL      Component Value Range   Alcohol, Ethyl (B) <11  0 - 11 (mg/dL)  URINE RAPID DRUG SCREEN (HOSP PERFORMED)      Component Value Range   Opiates NONE DETECTED  NONE DETECTED    Cocaine NONE DETECTED  NONE DETECTED    Benzodiazepines NONE DETECTED  NONE DETECTED    Amphetamines NONE DETECTED  NONE DETECTED    Tetrahydrocannabinol NONE DETECTED  NONE DETECTED    Barbiturates NONE  DETECTED  NONE DETECTED   BASIC METABOLIC PANEL      Component Value Range   Sodium 138  135 - 145 (mEq/L)   Potassium 3.6  3.5 - 5.1 (mEq/L)   Chloride 104  96 - 112 (mEq/L)   CO2 22  19 - 32 (mEq/L)   Glucose, Bld 197 (*) 70 - 99 (mg/dL)   BUN 13  6 - 23 (mg/dL)   Creatinine, Ser 4.09  0.50 - 1.35 (mg/dL)   Calcium 81.1  8.4 - 10.5 (mg/dL)   GFR calc non Af Amer >90  >90 (mL/min)   GFR calc Af Amer >90  >90 (mL/min)  CBC      Component Value Range   WBC 9.0  4.0 - 10.5 (K/uL)   RBC 4.68  4.22 - 5.81 (MIL/uL)   Hemoglobin 13.1  13.0 - 17.0 (g/dL)   HCT 91.4 (*) 78.2 - 52.0 (%)   MCV 79.9  78.0 - 100.0 (fL)   MCH 28.0  26.0 - 34.0 (pg)   MCHC  35.0  30.0 - 36.0 (g/dL)   RDW 95.6  21.3 - 08.6 (%)   Platelets 247  150 - 400 (K/uL)  DIFFERENTIAL      Component Value Range   Neutrophils Relative 61  43 - 77 (%)   Neutro Abs 5.5  1.7 - 7.7 (K/uL)   Lymphocytes Relative 29  12 - 46 (%)   Lymphs Abs 2.6  0.7 - 4.0 (K/uL)   Monocytes Relative 8  3 - 12 (%)   Monocytes Absolute 0.7  0.1 - 1.0 (K/uL)   Eosinophils Relative 2  0 - 5 (%)   Eosinophils Absolute 0.2  0.0 - 0.7 (K/uL)   Basophils Relative 0  0 - 1 (%)   Basophils Absolute 0.0  0.0 - 0.1 (K/uL)  GLUCOSE, CAPILLARY      Component Value Range   Glucose-Capillary 106 (*) 70 - 99 (mg/dL)  GLUCOSE, CAPILLARY      Component Value Range   Glucose-Capillary 113 (*) 70 - 99 (mg/dL)  GLUCOSE, CAPILLARY      Component Value Range   Glucose-Capillary 127 (*) 70 - 99 (mg/dL)   Comment 1 Documented in Chart     Comment 2 Notify RN    GLUCOSE, CAPILLARY      Component Value Range   Glucose-Capillary 140 (*) 70 - 99 (mg/dL)   Comment 1 Documented in Chart     Comment 2 Notify RN     No results found.    1. Suicidal behavior     ACT Team is consulted to assist with placement.  MDM  Maj. depression with suicidal ideation. Old records have been reviewed and he has had several admissions to [facility], although none recently.        Dione Booze, MD 09/07/11 1736

## 2011-09-07 ENCOUNTER — Encounter (HOSPITAL_COMMUNITY): Payer: Self-pay | Admitting: *Deleted

## 2011-09-07 ENCOUNTER — Inpatient Hospital Stay (HOSPITAL_COMMUNITY)
Admission: AD | Admit: 2011-09-07 | Discharge: 2011-09-13 | DRG: 885 | Disposition: A | Discharge status: Home / Self Care | Payer: Medicare Other | Source: Ambulatory Visit | Attending: Psychiatry | Admitting: Psychiatry

## 2011-09-07 DIAGNOSIS — E119 Type 2 diabetes mellitus without complications: Secondary | ICD-10-CM | POA: Diagnosis present

## 2011-09-07 DIAGNOSIS — I10 Essential (primary) hypertension: Secondary | ICD-10-CM | POA: Diagnosis present

## 2011-09-07 DIAGNOSIS — Z79899 Other long term (current) drug therapy: Secondary | ICD-10-CM

## 2011-09-07 DIAGNOSIS — R45851 Suicidal ideations: Secondary | ICD-10-CM

## 2011-09-07 DIAGNOSIS — G589 Mononeuropathy, unspecified: Secondary | ICD-10-CM

## 2011-09-07 DIAGNOSIS — G4730 Sleep apnea, unspecified: Secondary | ICD-10-CM

## 2011-09-07 DIAGNOSIS — F339 Major depressive disorder, recurrent, unspecified: Principal | ICD-10-CM

## 2011-09-07 DIAGNOSIS — K5900 Constipation, unspecified: Secondary | ICD-10-CM

## 2011-09-07 DIAGNOSIS — Z0189 Encounter for other specified special examinations: Secondary | ICD-10-CM

## 2011-09-07 HISTORY — DX: Depression, unspecified: F32.A

## 2011-09-07 HISTORY — DX: Major depressive disorder, single episode, unspecified: F32.9

## 2011-09-07 LAB — GLUCOSE, CAPILLARY
Glucose-Capillary: 61 mg/dL — ABNORMAL LOW (ref 70–99)
Glucose-Capillary: 86 mg/dL (ref 70–99)
Glucose-Capillary: 82 mg/dL (ref 70–99)

## 2011-09-07 LAB — URINALYSIS, ROUTINE W REFLEX MICROSCOPIC
Hgb urine dipstick: NEGATIVE
Leukocytes, UA: NEGATIVE
Nitrite: NEGATIVE
Specific Gravity, Urine: 1.014 (ref 1.005–1.030)
Urobilinogen, UA: 1 mg/dL (ref 0.0–1.0)

## 2011-09-07 MED ORDER — TRAZODONE HCL 50 MG PO TABS
150.0000 mg | ORAL_TABLET | Freq: Every day | ORAL | Status: DC
  Administered 2011-09-07 – 2011-09-12 (×6): 150 mg via ORAL
  Filled 2011-09-07 (×5): qty 1
  Filled 2011-09-07: qty 6
  Filled 2011-09-07 (×3): qty 1

## 2011-09-07 MED ORDER — ALUM & MAG HYDROXIDE-SIMETH 200-200-20 MG/5ML PO SUSP: 30.0000 mL | ORAL | Status: DC | PRN

## 2011-09-07 MED ORDER — MAGNESIUM HYDROXIDE 400 MG/5ML PO SUSP
30.0000 mL | Freq: Every day | ORAL | Status: DC | PRN
  Administered 2011-09-09 – 2011-09-12 (×2): 30 mL via ORAL

## 2011-09-07 MED ORDER — PREGABALIN 75 MG PO CAPS
75.0000 mg | ORAL_CAPSULE | Freq: Three times a day (TID) | ORAL | Status: DC
  Administered 2011-09-07 – 2011-09-13 (×19): 75 mg via ORAL
  Filled 2011-09-07 (×18): qty 1

## 2011-09-07 MED ORDER — ACETAMINOPHEN 325 MG PO TABS: 650.0000 mg | ORAL_TABLET | Freq: Four times a day (QID) | ORAL | Status: DC | PRN

## 2011-09-07 MED ORDER — NICOTINE 21 MG/24HR TD PT24
21.0000 mg | MEDICATED_PATCH | Freq: Every day | TRANSDERMAL | Status: DC
  Filled 2011-09-07: qty 1

## 2011-09-07 MED ORDER — INSULIN ASPART 100 UNIT/ML ~~LOC~~ SOLN
0.0000 [IU] | Freq: Three times a day (TID) | SUBCUTANEOUS | Status: DC
  Administered 2011-09-07 – 2011-09-11 (×4): 3 [IU] via SUBCUTANEOUS
  Filled 2011-09-07: qty 3

## 2011-09-07 MED ORDER — GLIPIZIDE 10 MG PO TABS
10.0000 mg | ORAL_TABLET | Freq: Every day | ORAL | Status: DC
  Administered 2011-09-07: 10 mg via ORAL
  Filled 2011-09-07 (×3): qty 1

## 2011-09-07 MED ORDER — METFORMIN HCL 500 MG PO TABS
500.0000 mg | ORAL_TABLET | Freq: Two times a day (BID) | ORAL | Status: DC
  Administered 2011-09-07 – 2011-09-13 (×13): 500 mg via ORAL
  Filled 2011-09-07 (×2): qty 1
  Filled 2011-09-07: qty 4
  Filled 2011-09-07 (×5): qty 1
  Filled 2011-09-07: qty 4
  Filled 2011-09-07 (×8): qty 1

## 2011-09-07 MED ORDER — METOPROLOL TARTRATE 100 MG PO TABS
100.0000 mg | ORAL_TABLET | Freq: Two times a day (BID) | ORAL | Status: DC
  Administered 2011-09-07 – 2011-09-09 (×5): 100 mg via ORAL
  Filled 2011-09-07 (×9): qty 1

## 2011-09-07 NOTE — Progress Notes (Signed)
[facility] Group Notes:  (Counselor/Nursing/MHT/Case Management/Adjunct)    Type of Therapy:  Group Therapy  Participation Level:  Did Not Attend    [name] 09/07/2011  2:49 PM

## 2011-09-07 NOTE — H&P (Signed)
Psychiatric Admission Assessment Adult  Patient Identification:  [name] Date of Evaluation:  09/07/2011 Chief Complaint:  MDD  History of Present Illness:: Patient is a 51 year old African-American male, admitted to [facility] from the [facility] ED with complaints of suicidal ideation. Patient reports, "I became suicidal because I was living with bunch of ass holes in a boarding house in [facility].  All these folks do is to sit around, smoke weed and do drugs. I got fed up with it. I could not take that any more. I asked for the remainder of my rent money and headed down to [facility]. I have been here in [place] for 2 weeks. I am down on my luck right now. Homeless and everything. I started thinking about suicide. I thought may be cut my wrist or run in front of a truck and it will be over in a minute. The thought of suicide has been very strong in the past 3 days. It is hard to live on the streets. The streets are not safe to be. You worry about getting mugged, freezing in the cold or your medicines getting stolen from you. It is war out there"  Mood Symptoms:  Anhedonia, Depression, Sadness, SI, Depression Symptoms:  depressed mood, sadness (Hypo) Manic Symptoms:  Irritable Mood, Anxiety Symptoms:  Excessive Worry, Psychotic Symptoms:  Hallucinations: None  PTSD Symptoms: None reported   Past Psychiatric History: Diagnosis: Major depressive disorder  Hospitalizations: [facility]  Outpatient Care: None reported  Substance Abuse Care: None reported  Self-Mutilation: "I used to cut my wrist in an attempt to kill me"  Suicidal Attempts: Denies any recent attempts, admits thoughts  Violent Behaviors: None reported.   Past Medical History:   Past Medical History  Diagnosis Date  . Hypertension   . Diabetes mellitus   . Constipation   . Neuropathy    None. Allergies:   Allergies  Allergen Reactions  . Ace Inhibitors Other (See Comments)   cough   PTA Medications: Prescriptions prior to admission  Medication Sig Dispense Refill  . citalopram (CELEXA) 20 MG tablet Take 20 mg by mouth daily.      Marland Kitchen docusate-casanthranol (PERICOLACE) 100-30 MG per capsule Take 1 capsule by mouth 2 (two) times daily as needed. For constipation      . glyBURIDE (DIABETA) 5 MG tablet Take 10 mg by mouth daily with breakfast.      . hydrochlorothiazide (HYDRODIURIL) 25 MG tablet Take 25 mg by mouth every morning. Before meal      . metFORMIN (GLUCOPHAGE) 500 MG tablet Take 500 mg by mouth 2 (two) times daily with a meal.      . metoprolol (LOPRESSOR) 100 MG tablet Take 100 mg by mouth daily.       . pregabalin (LYRICA) 75 MG capsule Take 75 mg by mouth 3 (three) times daily.      . traZODone (DESYREL) 150 MG tablet Take 150 mg by mouth at bedtime.        Previous Psychotropic Medications:  Medication/Dose                 Substance Abuse History in the last 12 months: Substance Age of 1st Use Last Use Amount Specific Type  Nicotine "I don't smoke, use drugs and or drink alcohol"     Alcohol      Cannabis      Opiates      Cocaine      Methamphetamines  LSD      Ecstasy      Benzodiazepines      Caffeine      Inhalants      Others:                         Consequences of Substance Abuse: Medical Consequences:  Liver damage, possible death from overdose Legal Consequences:  Arrests, jail time Family Consequences:  Family discord  Social History: Current Place of Residence:   Place of Birth:   Family Members: Marital Status:  Single Relationships: None reported Education:  HS Graduate History of Abuse (Emotional/Phsycial/Sexual): None reported Teacher, music History:  None. Legal History: None reported   Family History:  History reviewed. No pertinent family history.  Mental Status Examination/Evaluation: Objective:  Appearance: Disheveled  Eye Contact::  Fair  Speech:  Clear and Coherent    Volume:  Normal  Mood:  Depressed and Irritable  Affect:  Flat  Thought Process:  Intact and Logical  Orientation:  Full  Thought Content:  WDL  Suicidal Thoughts:  Yes.  without intent/plan  Homicidal Thoughts:  No  Memory:  Immediate;   Good  Judgement:  Fair  Insight:  Fair  Psychomotor Activity:  Normal  Concentration:  Good  Recall:  Good  Akathisia:  No  Handed:  Right  AIMS (if indicated):     Assets:  Desire for Improvement  Sleep:  Number of Hours: 1.5            Assessment:    AXIS I:  Major depressive disorder, recurrent AXIS II:  Deferred AXIS III:   Past Medical History  Diagnosis Date  . Hypertension   . Diabetes mellitus   . Constipation   . Neuropathy    AXIS IV:  economic problems, housing problems, occupational problems, problems with access to health care services and problems with primary support group AXIS V:  41-50 serious symptoms  Treatment Plan/Recommendations: Admit for safety and stabilization.                                                                Review and reinstate pertinent home medications.                                                                Obtain urinalysis and HGBa1c.  Treatment Plan Summary: Daily contact with patient to assess and evaluate symptoms and progress in treatment Medication management Current Medications:  Current Facility-Administered Medications  Medication Dose Route Frequency Provider Last Rate Last Dose  . acetaminophen (TYLENOL) tablet 650 mg  650 mg Oral Q6H PRN Syed T. Arfeen, MD      . alum & mag hydroxide-simeth (MAALOX/MYLANTA) 200-200-20 MG/5ML suspension 30 mL  30 mL Oral Q4H PRN Syed T. Arfeen, MD      . glipiZIDE (GLUCOTROL) tablet 10 mg  10 mg Oral Q breakfast Syed T. Arfeen, MD   10 mg at 09/07/11 0820  . insulin aspart (novoLOG) injection 0-15 Units  0-15 Units Subcutaneous TID WC  Syed T. Arfeen, MD   3 Units at 09/07/11 [phone]  . magnesium hydroxide (MILK OF MAGNESIA) suspension  30 mL  30 mL Oral Daily PRN Syed T. Arfeen, MD      . metFORMIN (GLUCOPHAGE) tablet 500 mg  500 mg Oral BID WC Syed T. Arfeen, MD   500 mg at 09/07/11 0820  . metoprolol (LOPRESSOR) tablet 100 mg  100 mg Oral BID Syed T. Arfeen, MD   100 mg at 09/07/11 0821  . pregabalin (LYRICA) capsule 75 mg  75 mg Oral TID Syed T. Arfeen, MD   75 mg at 09/07/11 1209  . traZODone (DESYREL) tablet 150 mg  150 mg Oral QHS Syed T. Arfeen, MD      . DISCONTD: nicotine (NICODERM CQ - dosed in mg/24 hours) patch 21 mg  21 mg Transdermal Q0600 Syed T. Arfeen, MD       Facility-Administered Medications Ordered in Other Encounters  Medication Dose Route Frequency Provider Last Rate Last Dose  . DISCONTD: acetaminophen (TYLENOL) tablet 650 mg  650 mg Oral Q4H PRN Dione Booze, MD      . DISCONTD: alum & mag hydroxide-simeth (MAALOX/MYLANTA) 200-200-20 MG/5ML suspension 30 mL  30 mL Oral PRN Dione Booze, MD      . DISCONTD: ibuprofen (ADVIL,MOTRIN) tablet 600 mg  600 mg Oral Q8H PRN Dione Booze, MD      . DISCONTD: insulin aspart (novoLOG) injection 0-15 Units  0-15 Units Subcutaneous TID [facility] Dione Booze, MD   2 Units at 09/06/11 1756  . DISCONTD: LORazepam (ATIVAN) tablet 1 mg  1 mg Oral Q8H PRN Dione Booze, MD   1 mg at 09/06/11 2202  . DISCONTD: metFORMIN (GLUCOPHAGE) tablet 500 mg  500 mg Oral BID WC Joya Gaskins, MD      . DISCONTD: metoprolol tartrate (LOPRESSOR) tablet 100 mg  100 mg Oral BID Dione Booze, MD   100 mg at 09/06/11 0735  . DISCONTD: metoprolol tartrate (LOPRESSOR) tablet 100 mg  100 mg Oral Daily Joya Gaskins, MD   100 mg at 09/06/11 2104  . DISCONTD: ondansetron (ZOFRAN) tablet 4 mg  4 mg Oral Q8H PRN Dione Booze, MD      . DISCONTD: pregabalin (LYRICA) capsule 75 mg  75 mg Oral TID Joya Gaskins, MD   75 mg at 09/06/11 2203  . DISCONTD: pregabalin (LYRICA) capsule 75 mg  75 mg Oral To Minor Joya Gaskins, MD      . DISCONTD: traZODone (DESYREL) tablet 150 mg  150 mg Oral QHS Dione Booze, MD   150 mg at 09/06/11 2204  . DISCONTD: traZODone (DESYREL) tablet 150 mg  150 mg Oral QHS Joya Gaskins, MD      . DISCONTD: traZODone (DESYREL) tablet 150 mg  150 mg Oral To Minor Joya Gaskins, MD      . DISCONTD: zolpidem (AMBIEN) tablet 5 mg  5 mg Oral QHS PRN Dione Booze, MD        Observation Level/Precautions:  Q 15 minutes checks for safety  Laboratory:  HbAIC UA, results pending.  Psychotherapy:  Group  Medications:  See lists  Routine PRN Medications:  Yes  Consultations:  None indicated  Discharge Concerns:  Safety  Other:     Armandina Stammer I 1/24/20132:33 PM

## 2011-09-07 NOTE — Progress Notes (Signed)
Patient ID: [name], male   DOB: 10/12/1960, 51 y.o.   MRN: [id number]  Initially pt was slightly agitated and irritable during the assessment.  When asked what brought him into the hospital, he stated, "Why should I tell you? You won't understand, so theres no need to go into all that. You can't help me, nobody can".  After several minutes of the assessment and answering other questions, the writer asked the pt again. Pt stated that he lived in [place] for apprx 1 month at a boarding house with "assholes". Stated they didn't like him because he doesn't drink or smoke. Stated he asked for next months rent back and came back to [place]. However, pt is very vague when discussing. Admitted to previous SI 5 yrs ago. Stated SI this time was to run out in front of a truck. Pt is homeless, "I live on the streets. I go to some 24 hours places to stay warm, and it's good If I find one that has a bathroom". Support and encouragement was offered.

## 2011-09-07 NOTE — Tx Team (Signed)
Interdisciplinary Treatment Plan Update (Adult)  Date:  09/07/2011  Time Reviewed:  11:35 AM   Progress in Treatment: Attending groups: Yes Participating in groups:  Yes Taking medication as prescribed: Yes Tolerating medication:  Yes Family/Significant othe contact made:  Counselor assessing for appropriate contact Patient understands diagnosis:  Yes Discussing patient identified problems/goals with staff:  Yes Medical problems stabilized or resolved:  Yes Denies suicidal/homicidal ideation: Yes Issues/concerns per patient self-inventory:  None identified Other: N/A  New problem(s) identified: None Identified  Reason for Continuation of Hospitalization: Anxiety Depression Medication stabilization Suicidal ideation  Interventions implemented related to continuation of hospitalization: mood stabilization, medication monitoring and adjustment, group therapy and psycho education, safety checks q 15 mins  Additional comments: N/A  Estimated length of stay: 3-5 days  Discharge Plan: Pt will be referred to [facility] for medication management and therapy  New goal(s): N/A  Review of initial/current patient goals per problem list:    1.  Goal(s): Reduce depressive symptoms  Met:  No  Target date: by discharge  As evidenced by: Reducing depression from a 10 to a 3 as reported by pt.   2.  Goal (s): Reduce/Eliminate suicidal ideation  Met:  No  Target date: by discharge  As evidenced by: pt reporting no SI.    3.  Goal(s): Reduce anxiety  Met:  No  Target date: by discharge  As evidenced by: Reduce anxiety from a 10 to a 3 as reported by pt.    Attendees: Patient:  [name] 09/07/2011 11:45 AM  Family:     Physician:  Orson Aloe, MD  09/07/2011  11:35 AM   Nursing:   Omelia Blackwater, RN 09/07/2011  11:35 AM  Case Manager:  Reyes Ivan, LCSWA 09/07/2011  11:35 AM   Counselor:  Angus Palms, LCSW 09/07/2011  11:35 AM   Other:  Juline Patch, LCSW 09/07/2011   11:35 AM   Other:  Serena Colonel, NP 09/07/2011 11:35 AM  Other:     Other:      Scribe for Treatment Team:   Carmina Miller, 09/07/2011 , 11:35 AM

## 2011-09-07 NOTE — Progress Notes (Signed)
Pt did not attend d/c planning group on this date.  SW met with pt during treatment team on this date.  Pt states that he was suicidal at admission.  Pt states he was staying at a boarding house and didn't get along with people and left.  Pt was staying in [facility].  Pt states he left 3-4 days ago, used the rest of his money to catch a bus to get to [place].  Pt states that he has been staying at places that are open 24 hours, like [employer] or the grocery store.  Pt states he got depressed about not having a place to stay.  Pt has Medicare and Medicaid and gets $714 a month for disability.  Sw discussed the option of being placed at an assisted living facility.  Pt laughed and states that he tried before and would not being open to doing that because they take all of his money.  Pt states he gets his next disability check on the 1st and plans to move into a boarding house in [place].  SW will need a referral for medication management and therapy.  SW will refer pt to [facility] for medication management and therapy.  No further needs at this time.    Reyes Ivan, LCSWA 09/07/2011  11:31 AM    Per State Regulation 482.30 This Chart was reviewed for medical necessity with respect to the patient's Admission/Duration of stay.   Carmina Miller  09/07/2011  Next Review Date:  09/11/11

## 2011-09-07 NOTE — [facility] Suicide Risk Assessment (Signed)
Suicide Risk Assessment  Admission Assessment     Demographic factors:  Assessment Details Time of Assessment: Admission Information Obtained From: Patient Current Mental Status:  Current Mental Status: Suicidal ideation indicated by patient;Suicide plan;Belief that plan would result in death Loss Factors:  Loss Factors: Financial problems / change in socioeconomic status Historical Factors:  Historical Factors: Prior suicide attempts Risk Reduction Factors:  Risk Reduction Factors: Religious beliefs about death  CLINICAL FACTORS:   Dysthymia  COGNITIVE FEATURES THAT CONTRIBUTE TO RISK:  Closed-mindedness Thought constriction (tunnel vision)    SUICIDE RISK:   Mild:  Suicidal ideation of limited frequency, intensity, duration, and specificity.  There are no identifiable plans, no associated intent, mild dysphoria and related symptoms, good self-control (both objective and subjective assessment), few other risk factors, and identifiable protective factors, including available and accessible social support.  Diagnosis:  Axis I: Dysthymic Disorder  ADL's:  Intact  Sleep: Poor  Appetite:  Good  Suicidal Ideation:  "I've got real problems thinking about killing myself...Marland KitchenMarland KitchenI don't go to groups." Homicidal Ideation:  Denies any thoughts of harm to others  Mental Status Examination/Evaluation: Objective:  Appearance: Disheveled  Patent attorney::  Fair  Speech:  Clear and Coherent  Volume:  Normal  Mood:  Euthymic  Affect:  Congruent  Thought Process:  Intact  Orientation:  Full  Thought Content:  WDL  Suicidal Thoughts:  No  Homicidal Thoughts:  No  Memory:  Immediate;   Good  Judgement:  Fair  Insight:  Lacking  Psychomotor Activity:  Normal  Concentration:  Good  Recall:  Good  Akathisia:  No  AIMS (if indicated):     Assets:  Communication Skills Desire for Improvement Physical Health  Sleep:  @FLOW {[id number]@    Vital Signs: Blood pressure 152/108, pulse 80,  temperature 98.1 F (36.7 C), temperature source Oral, resp. rate 17, height 5\' 7"  (1.702 m), weight 100.018 kg (220 lb 8 oz). Current Medications:  Current Facility-Administered Medications  Medication Dose Route Frequency Provider Last Rate Last Dose  . acetaminophen (TYLENOL) tablet 650 mg  650 mg Oral Q6H PRN Syed T. Arfeen, MD      . alum & mag hydroxide-simeth (MAALOX/MYLANTA) 200-200-20 MG/5ML suspension 30 mL  30 mL Oral Q4H PRN Syed T. Arfeen, MD      . insulin aspart (novoLOG) injection 0-15 Units  0-15 Units Subcutaneous TID WC Syed T. Arfeen, MD   3 Units at 09/07/11 [phone]  . magnesium hydroxide (MILK OF MAGNESIA) suspension 30 mL  30 mL Oral Daily PRN Syed T. Arfeen, MD      . metFORMIN (GLUCOPHAGE) tablet 500 mg  500 mg Oral BID WC Syed T. Arfeen, MD   500 mg at 09/07/11 0820  . metoprolol (LOPRESSOR) tablet 100 mg  100 mg Oral BID Syed T. Arfeen, MD   100 mg at 09/07/11 0821  . pregabalin (LYRICA) capsule 75 mg  75 mg Oral TID Syed T. Arfeen, MD   75 mg at 09/07/11 1209  . traZODone (DESYREL) tablet 150 mg  150 mg Oral QHS Syed T. Arfeen, MD      . DISCONTD: glipiZIDE (GLUCOTROL) tablet 10 mg  10 mg Oral Q breakfast Syed T. Arfeen, MD   10 mg at 09/07/11 0820  . DISCONTD: nicotine (NICODERM CQ - dosed in mg/24 hours) patch 21 mg  21 mg Transdermal Q0600 Syed T. Arfeen, MD       Facility-Administered Medications Ordered in Other Encounters  Medication Dose Route Frequency Provider Last  Rate Last Dose  . DISCONTD: acetaminophen (TYLENOL) tablet 650 mg  650 mg Oral Q4H PRN Dione Booze, MD      . DISCONTD: alum & mag hydroxide-simeth (MAALOX/MYLANTA) 200-200-20 MG/5ML suspension 30 mL  30 mL Oral PRN Dione Booze, MD      . DISCONTD: ibuprofen (ADVIL,MOTRIN) tablet 600 mg  600 mg Oral Q8H PRN Dione Booze, MD      . DISCONTD: insulin aspart (novoLOG) injection 0-15 Units  0-15 Units Subcutaneous TID [facility] Dione Booze, MD   2 Units at 09/06/11 1756  . DISCONTD: LORazepam (ATIVAN) tablet 1  mg  1 mg Oral Q8H PRN Dione Booze, MD   1 mg at 09/06/11 2202  . DISCONTD: metFORMIN (GLUCOPHAGE) tablet 500 mg  500 mg Oral BID WC Joya Gaskins, MD      . DISCONTD: metoprolol tartrate (LOPRESSOR) tablet 100 mg  100 mg Oral BID Dione Booze, MD   100 mg at 09/06/11 0735  . DISCONTD: metoprolol tartrate (LOPRESSOR) tablet 100 mg  100 mg Oral Daily Joya Gaskins, MD   100 mg at 09/06/11 2104  . DISCONTD: ondansetron (ZOFRAN) tablet 4 mg  4 mg Oral Q8H PRN Dione Booze, MD      . DISCONTD: pregabalin (LYRICA) capsule 75 mg  75 mg Oral TID Joya Gaskins, MD   75 mg at 09/06/11 2203  . DISCONTD: pregabalin (LYRICA) capsule 75 mg  75 mg Oral To Minor Joya Gaskins, MD      . DISCONTD: traZODone (DESYREL) tablet 150 mg  150 mg Oral QHS Dione Booze, MD   150 mg at 09/06/11 2204  . DISCONTD: traZODone (DESYREL) tablet 150 mg  150 mg Oral QHS Joya Gaskins, MD      . DISCONTD: traZODone (DESYREL) tablet 150 mg  150 mg Oral To Minor Joya Gaskins, MD      . DISCONTD: zolpidem (AMBIEN) tablet 5 mg  5 mg Oral QHS PRN Dione Booze, MD        Lab Results:  Results for orders placed during the hospital encounter of 09/07/11 (from the past 48 hour(s))  GLUCOSE, CAPILLARY     Status: Abnormal   Collection Time   09/07/11  6:14 AM      Component Value Range Comment   Glucose-Capillary 170 (*) 70 - 99 (mg/dL)    Comment 1 Notify RN      Comment 2 Documented in Chart     GLUCOSE, CAPILLARY     Status: Abnormal   Collection Time   09/07/11 12:11 PM      Component Value Range Comment   Glucose-Capillary 61 (*) 70 - 99 (mg/dL)   GLUCOSE, CAPILLARY     Status: Normal   Collection Time   09/07/11 12:53 PM      Component Value Range Comment   Glucose-Capillary 86  70 - 99 (mg/dL)    Physical Findings: AIMS:   CIWA:     COWS:      Treatment Plan Summary: Daily contact with patient to assess and evaluate symptoms and progress in treatment Medication management Have patient attend  groups to learn coping skills that will help him tolerate others who are unreasonable.  Plan: Have patient attend groups so he can learn how to tolerate others who are unreasonable.  Dan Humphreys, Amalia Edgecombe 09/07/2011 4:09 PM   Jammy Stlouis 09/07/2011, 4:07 PM

## 2011-09-07 NOTE — Tx Team (Signed)
Initial Interdisciplinary Treatment Plan  PATIENT STRENGTHS: (choose at least two) Ability for insight Active sense of humor Average or above average intelligence Capable of independent living Communication skills General fund of knowledge Motivation for treatment/growth Physical Health Special hobby/interest Work skills  PATIENT STRESSORS: Financial difficulties Medication change or noncompliance   PROBLEM LIST: Problem List/Patient Goals Date to be addressed Date deferred Reason deferred Estimated date of resolution  "Make sure that I'm safe" 09/07/11     "Make sure I take my medication" 09/07/11           Depression      Suicidal thoughts                               DISCHARGE CRITERIA:  Ability to meet basic life and health needs Adequate post-discharge living arrangements Improved stabilization in mood, thinking, and/or behavior Medical problems require only outpatient monitoring Motivation to continue treatment in a less acute level of care Need for constant or close observation no longer present Reduction of life-threatening or endangering symptoms to within safe limits Safe-care adequate arrangements made Verbal commitment to aftercare and medication compliance  PRELIMINARY DISCHARGE PLAN: Outpatient therapy Participate in family therapy Placement in alternative living arrangements  PATIENT/FAMIILY INVOLVEMENT: This treatment plan has been presented to and reviewed with the patient, [name], and/or family member.  The patient and family have been given the opportunity to ask questions and make suggestions.  [name] 09/07/2011, 1:29 AM

## 2011-09-07 NOTE — Progress Notes (Addendum)
Patient's CBG at 1200 was 61.  Patient went to lunch.  Ate 100% of lunch, and went back for seconds.  Patient's CBG after lunch was 86.  Patient stated he is feeling fine, no problems.  Patient stated he takes his blood sugar at home 2-3 times daily.  Patient feels he does not need to take sliding scale insulin because of his fluctuating blood sugars.  Patient stated he is feeling good and not having any problems.   Information was discussed with charge nurse, Asst Director, and N.P.   Patient has been cooperative and pleasant. On self inventory sheet, patient has poor sleep, good appetite, low energy level, poor attention span.   Rated depression and hopelessness #10.  Denied withdrawals.  Positive SI, jump in front of truck.   Denied SI with nurse and contracts for safety.   Stated he always has thoughts of suicide.  No physical problems in past 24 hours.  Denied pain.  Does not have any discharge plans, no problems taking meds after discharge.

## 2011-09-07 NOTE — Progress Notes (Signed)
Patient ID: [name], male   DOB: [birth year], 51 y.o.   MRN: [id number]  Pt continues to express suicidal thoughts, but does contract for safety. Stated that the "thoughts are still there, but are trying to go away". Stated he didn't go to many groups today because he was still tired, but that he plans to attend more tomorrow. Pt informed the writer that he's only to have his blood sugar tested 3 times a day, and informed the writer that he's not doing it tonight. Pt still labile when staff doesn't respond to him the way he wants or with the answer he was seeking. Support and encouragement was offered.

## 2011-09-07 NOTE — Progress Notes (Signed)
[facility] Group Notes:  (Counselor/Nursing/MHT/Case Management/Adjunct)  09/07/2011 12:44 PM  Type of Therapy:  Group Therapy  Participation Level:  Did Not Attend  Wilmon Arms 09/07/2011, 12:44 PM  Cosigned by: Angus Palms, LCSW

## 2011-09-08 LAB — HEMOGLOBIN A1C: Hgb A1c MFr Bld: 6.6 % — ABNORMAL HIGH (ref <5.7)

## 2011-09-08 MED ORDER — GLIPIZIDE ER 10 MG PO TB24
10.0000 mg | ORAL_TABLET | Freq: Every day | ORAL | Status: DC
  Administered 2011-09-09 – 2011-09-13 (×5): 10 mg via ORAL
  Filled 2011-09-08: qty 1
  Filled 2011-09-08 (×2): qty 2
  Filled 2011-09-08 (×6): qty 1

## 2011-09-08 MED ORDER — RAMELTEON 8 MG PO TABS
8.0000 mg | ORAL_TABLET | Freq: Every day | ORAL | Status: DC
  Administered 2011-09-08 – 2011-09-09 (×2): 8 mg via ORAL
  Filled 2011-09-08 (×7): qty 1

## 2011-09-08 NOTE — Progress Notes (Signed)
Pt blunted and depressed with si thoughts. Pt reports that he did not sleep well and will be talking to MD this morning about changing sleep medication. Pt rates hopelessness of 7 and depression of 8. He plans to stay around positive people following discharge. Offered support, encouragement and 15 minute checks. Pt verbally contracts with Clinical research associate for safety.

## 2011-09-08 NOTE — Progress Notes (Signed)
Pt. Pleasant and cooperative.  Reports that he still has thoughts to "jump in front of a truck".  Pt. Contracts for safety.  Encouragement and support given.  Pt. Receptive.

## 2011-09-08 NOTE — Progress Notes (Signed)
Case Management Note: 09/08/2011                             Pt attended aftercare planning group- pt states he has SI with a plan (pt would not share with plan) and rates depression at an 8 and anxiety at a 3, pt was able to contract for safety and to tell a staff member if he feels unsafe. Pt will follow-up with [facility] on 09/20/10 and will discharge to a shelter if he does not find a boarding house to accept him. Danitza Schoenfeldt, LPCA

## 2011-09-08 NOTE — Progress Notes (Signed)
[facility] MD Progress Note  09/08/2011 12:04 PM  ADL's:  Intact  Sleep: Good  Appetite:  Good  Suicidal Ideation:  States he has suicidal thoughts off and on and says that he has learned some new coping skills to deal with them.  He is very vague in his description of those new coping skills learned.  Homicidal Ideation:  Denies adamantly any homicidal thoughts.  Mental Status Examination/Evaluation: Objective:  Appearance: Casual  Eye Contact::  Good  Speech:  Clear and Coherent  Volume:  Normal  Mood:  Euthymic  Affect:  Congruent  Thought Process:  Coherent  Orientation:  Full  Thought Content:  WDL  Suicidal Thoughts:  Yes.  without intent/plan  Homicidal Thoughts:  No  Memory:  Immediate;   Good  Judgement:  Fair  Insight:  Fair  Psychomotor Activity:  Normal  Concentration:  Good  Recall:  Good  Akathisia:  No  AIMS (if indicated):     Assets:  Communication Skills Leisure Time Resilience  Sleep:  Number of Hours: 6    Vital Signs:Blood pressure 146/92, pulse 83, temperature 99 F (37.2 C), temperature source Oral, resp. rate 24, height 5\' 7"  (1.702 m), weight 100.018 kg (220 lb 8 oz). Current Medications: Current Facility-Administered Medications  Medication Dose Route Frequency Provider Last Rate Last Dose  . acetaminophen (TYLENOL) tablet 650 mg  650 mg Oral Q6H PRN Syed T. Arfeen, MD      . alum & mag hydroxide-simeth (MAALOX/MYLANTA) 200-200-20 MG/5ML suspension 30 mL  30 mL Oral Q4H PRN Syed T. Arfeen, MD      . glipiZIDE (GLUCOTROL XL) 24 hr tablet 10 mg  10 mg Oral Q breakfast Orson Aloe, MD      . insulin aspart (novoLOG) injection 0-15 Units  0-15 Units Subcutaneous TID WC Syed T. Arfeen, MD   3 Units at 09/08/11 [id number]  . magnesium hydroxide (MILK OF MAGNESIA) suspension 30 mL  30 mL Oral Daily PRN Syed T. Arfeen, MD      . metFORMIN (GLUCOPHAGE) tablet 500 mg  500 mg Oral BID WC Syed T. Arfeen, MD   500 mg at 09/08/11 0843  . metoprolol (LOPRESSOR) tablet  100 mg  100 mg Oral BID Syed T. Arfeen, MD   100 mg at 09/08/11 0844  . pregabalin (LYRICA) capsule 75 mg  75 mg Oral TID Syed T. Arfeen, MD   75 mg at 09/08/11 1152  . ramelteon (ROZEREM) tablet 8 mg  8 mg Oral QHS Orson Aloe, MD      . traZODone (DESYREL) tablet 150 mg  150 mg Oral QHS Orson Aloe, MD   150 mg at 09/07/11 2210  . DISCONTD: glipiZIDE (GLUCOTROL) tablet 10 mg  10 mg Oral Q breakfast Syed T. Arfeen, MD   10 mg at 09/07/11 0820    Lab Results:  Results for orders placed during the hospital encounter of 09/07/11 (from the past 48 hour(s))  GLUCOSE, CAPILLARY     Status: Abnormal   Collection Time   09/07/11  6:14 AM      Component Value Range Comment   Glucose-Capillary 170 (*) 70 - 99 (mg/dL)    Comment 1 Notify RN      Comment 2 Documented in Chart     GLUCOSE, CAPILLARY     Status: Abnormal   Collection Time   09/07/11 12:11 PM      Component Value Range Comment   Glucose-Capillary 61 (*) 70 - 99 (mg/dL)   GLUCOSE,  CAPILLARY     Status: Normal   Collection Time   09/07/11 12:53 PM      Component Value Range Comment   Glucose-Capillary 86  70 - 99 (mg/dL)   GLUCOSE, CAPILLARY     Status: Normal   Collection Time   09/07/11  4:45 PM      Component Value Range Comment   Glucose-Capillary 82  70 - 99 (mg/dL)   URINALYSIS, ROUTINE W REFLEX MICROSCOPIC     Status: Normal   Collection Time   09/07/11  5:21 PM      Component Value Range Comment   Color, Urine YELLOW  YELLOW     APPearance CLEAR  CLEAR     Specific Gravity, Urine 1.014  1.005 - 1.030     pH 7.5  5.0 - 8.0     Glucose, UA NEGATIVE  NEGATIVE (mg/dL)    Hgb urine dipstick NEGATIVE  NEGATIVE     Bilirubin Urine NEGATIVE  NEGATIVE     Ketones, ur NEGATIVE  NEGATIVE (mg/dL)    Protein, ur NEGATIVE  NEGATIVE (mg/dL)    Urobilinogen, UA 1.0  0.0 - 1.0 (mg/dL)    Nitrite NEGATIVE  NEGATIVE     Leukocytes, UA NEGATIVE  NEGATIVE  MICROSCOPIC NOT DONE ON URINES WITH NEGATIVE PROTEIN, BLOOD, LEUKOCYTES,  NITRITE, OR GLUCOSE <1000 mg/dL.  HEMOGLOBIN A1C     Status: Abnormal   Collection Time   09/08/11  6:15 AM      Component Value Range Comment   Hemoglobin A1C 6.6 (*) <5.7 (%)    Mean Plasma Glucose 143 (*) <117 (mg/dL)   GLUCOSE, CAPILLARY     Status: Abnormal   Collection Time   09/08/11  6:20 AM      Component Value Range Comment   Glucose-Capillary 155 (*) 70 - 99 (mg/dL)     Physical Findings: AIMS:  , ,  ,  ,    CIWA:    COWS:     Treatment Plan Summary: Daily contact with patient to assess and evaluate symptoms and progress in treatment Medication management  Plan: Continue to actively pursue placement for him.  He seems to have good self care skills for his health, but does not seem to be able to maintain proper housing for himself.  He chooses to not live in supported living so he can have more spending money for himself each month.  He reports that he has child support responsibilities that would prevent him from going into supported living.  Mai Longnecker 09/08/2011, 12:04 PM

## 2011-09-08 NOTE — Progress Notes (Signed)
[facility] Group Notes:  (Counselor/Nursing/MHT/Case Management/Adjunct)  09/08/2011 12:23 PM  Type of Therapy:  Group Therapy  Participation Level:  Minimal  Participation Quality:  Appropriate and Attentive  Affect:  Appropriate  Cognitive:  Alert and Appropriate  Insight:  Limited  Engagement in Group:  Limited  Engagement in Therapy:  Limited  Modes of Intervention:  Education and Exploration  Summary of Progress/Problems: In discussing ways to prevent relapsing back into unhealthy habits, the patient stated pampering himself and taking time out for himself are ways he prevents relapsing.   [name] 09/08/2011, 12:23 PM  Cosigned by: Angus Palms, LCSW

## 2011-09-09 DIAGNOSIS — I10 Essential (primary) hypertension: Secondary | ICD-10-CM | POA: Diagnosis present

## 2011-09-09 DIAGNOSIS — E119 Type 2 diabetes mellitus without complications: Secondary | ICD-10-CM | POA: Diagnosis present

## 2011-09-09 DIAGNOSIS — Z0189 Encounter for other specified special examinations: Secondary | ICD-10-CM

## 2011-09-09 DIAGNOSIS — R45851 Suicidal ideations: Secondary | ICD-10-CM

## 2011-09-09 LAB — GLUCOSE, CAPILLARY
Glucose-Capillary: 111 mg/dL — ABNORMAL HIGH (ref 70–99)
Glucose-Capillary: 66 mg/dL — ABNORMAL LOW (ref 70–99)

## 2011-09-09 MED ORDER — BISACODYL 5 MG PO TBEC
5.0000 mg | DELAYED_RELEASE_TABLET | Freq: Every day | ORAL | Status: DC | PRN
  Administered 2011-09-12: 5 mg via ORAL
  Filled 2011-09-09 (×3): qty 1

## 2011-09-09 MED ORDER — METOPROLOL TARTRATE 50 MG PO TABS
150.0000 mg | ORAL_TABLET | Freq: Two times a day (BID) | ORAL | Status: DC
  Administered 2011-09-09 – 2011-09-13 (×8): 150 mg via ORAL
  Filled 2011-09-09 (×14): qty 1

## 2011-09-09 MED ORDER — DOCUSATE SODIUM 100 MG PO CAPS
100.0000 mg | ORAL_CAPSULE | Freq: Two times a day (BID) | ORAL | Status: DC
  Administered 2011-09-09 – 2011-09-13 (×5): 100 mg via ORAL
  Filled 2011-09-09 (×12): qty 1

## 2011-09-09 NOTE — Progress Notes (Signed)
D:  Pt refused CBG at night on 09/09/11.  A:  Reported to RN pt refused order CBG requested this underwriter complete.  R:  Pt is safe. Aundria Rud, Marra Fraga L, MHT/NS

## 2011-09-09 NOTE — Progress Notes (Signed)
Met with pt 1:1 who is pleasant though somewhat guarded. Pt's affect is incongruent with reported depressive symptoms and passive SI. States he is still thinking about jumping in front of traffic but denies any plan to hurt himself while on unit. He refused CBG this evening stating, "I don't check it at night." Medicated per orders. No requests or needs for prn's. He denies HI/AVH and remains safe. Lawrence Marseilles

## 2011-09-09 NOTE — Progress Notes (Signed)
Pt. 's blood pressure at 2216 186/115 sitting.  Blood pressure at 2358 149/89 lying

## 2011-09-09 NOTE — Progress Notes (Addendum)
Patient ID: [name], male   DOB: [birth year], 51 y.o.   MRN: [id number] 09/09/2011 Nursing 1800 D Can is status quo. He  Gets agitated easily...when  His BP and insulin  Dosages are changed  ( from what he takes at home). He is semi-cooperative.Marland Kitchenas  long as things are going his way. HE can easily escalate and get angry and agitated if he doesn't agree with and / or understand whatever boundary staff  Sets. A  He did not complete his self inventory   sheet today, although he was encouraged to do so. A His BP at 1600 was 178/113 and the PA  ( NM ) was notified and his Metoprolol was increased to 150  Mg BID ( he received 150 mg at 1730). He refused his sliding scale insulin  ( His CBG was 177) at lunch and his CBG was 66 at dinnertime ; he did not want it checked after eating dinner. A He attends some groups and states he wants to " get better" so he can go home. R Safety is maintaiend and POC includes fostering therapeutic relationship PD RN [facility]

## 2011-09-09 NOTE — Progress Notes (Signed)
[facility] Group Notes:  (Counselor/Nursing/MHT/Case Management/Adjunct)  09/09/2011 3:01 PM  Type of Therapy:  Group Therapy  Pt. Did not attend group session.   Lamar Blinks [place] 09/09/2011, 3:01 PM

## 2011-09-09 NOTE — Progress Notes (Signed)
[name]  51 y.o.  [id number] 1961/06/24  09/09/2011   Diagnosis: Major depressive disorder                     Multiple medical problems                     R/O sleep apnea   Subjective: Pt. States he is doing better today. He states he is constipated. [name] reports that he is doing well, and getting along with everyone on his floor. He reports no new problems other than poor sleep.  He denies snoring.  He also reports that he does not want to consider a diuretic for his bp and that he is allergic to ACE inhibitors.    Objective:Vital Signs:Blood pressure 160/109, pulse 80, temperature 97.8 F (36.6 C), temperature source Oral, resp. rate 20, height 5\' 7"  (1.702 m), weight 220 lb 8 oz (100.018 kg). His blood pressure has continued to trend upward during his visits and he is getting his medications as written. He has poor dention, a large neck and large abdominal girth. He denies ever having been diagnosed with sleep apnea and states he does not snore. Assessment  MDD severe                        Uncontrolled hypertension                        Type 2 DM                        Increased risk for sleep apnea.   Medications Scheduled:  . docusate sodium  100 mg Oral BID  . glipiZIDE  10 mg Oral Q breakfast  . insulin aspart  0-15 Units Subcutaneous TID WC  . metFORMIN  500 mg Oral BID WC  . metoprolol tartrate  100 mg Oral BID  . pregabalin  75 mg Oral TID  . ramelteon  8 mg Oral QHS  . traZODone  150 mg Oral QHS   PRN Meds acetaminophen, alum & mag hydroxide-simeth, bisacodyl, magnesium hydroxid e  Plan:  Continue current plan with the follow ing changes.  Increase metaprolol to 150mg  BID.  Will consider low dose ARB if no improvement in BP in 24 hours. Psychoeducation regarding increased risk for depression with Beta Blockers.  Declined to give sedative hypnotic for sleep due to the increased risk for respiratory depression in diabetic. Will treat constipation with stool softener  and laxative. Will have RNs monitor for "snoring" (aka poor man's sleep study) during the 3rd shift.  Will check diet for carbs and sodium content.  [name]. [name] [facility] 09/09/2011 4:10 PM

## 2011-09-09 NOTE — Progress Notes (Signed)
Blood Pressure, in lying position at 23:50, 09/08/11 was 149/89; Pulse 67.  Has been sleeping at long intervals since beginning of night shift.  Safety maintained via Q 15 min safety checks.

## 2011-09-10 LAB — GLUCOSE, CAPILLARY: Glucose-Capillary: 137 mg/dL — ABNORMAL HIGH (ref 70–99)

## 2011-09-10 MED ORDER — HYDROCHLOROTHIAZIDE 25 MG PO TABS
25.0000 mg | ORAL_TABLET | Freq: Every day | ORAL | Status: DC
  Filled 2011-09-10 (×4): qty 1

## 2011-09-10 NOTE — Progress Notes (Signed)
Patient ID: [name], male   DOB: 12/31/1960, 51 y.o.   MRN: [id number] 09/10/2011   Nursing 1840  [name] is status quo. He is limited  In his abilitie(s) to process and understand. Thus, he gets impatient, he escalates easily and gets agitated quickly when things do not  go as he has planned and / or how he wants them to. A HE completed his self inventory and returned it to this nurse and on it he wrote he has " constant" SI, but was able to contract for safety with this nurse when nurse approached him to clarify.  He rated his depression and hopelessness " 7 / 7 ". He refused his vital signs to be taken this AM, along with his CBG. At 10:30 he allowed this nurse to take BP = 138/ 88 HRR 78 RR 13. His cbg at lunch was 142 bu the refused sliding scale insulin. His cbg at dinner was 90 ( no sliding scale ordered). R Safety is maintaiend and POC includes fostering therapeutic relationship already in place PD RN [facility]

## 2011-09-10 NOTE — Progress Notes (Signed)
[facility] Group Notes:  (Counselor/Nursing/MHT/Case Management/Adjunct)  09/10/2011 1315  Type of Therapy:  Group Therapy  Participation Level:  Did Not Attend  Crosswell, Desiree  09/10/2011, 2:35 PM

## 2011-09-10 NOTE — Progress Notes (Addendum)
Pt did allow CBG to be taken which was 137. Offered insulin to pt who said, "Leave me the fuck alone. Don't you know not to be messing with me in the morning? I already told them last night I didn't want insulin. You all need to be talking to each other." Redirected pt regarding his language but he walked away angrily. [name] Pt was observed by this RN at various times during the night. No snoring audible. [name]

## 2011-09-11 ENCOUNTER — Encounter (HOSPITAL_COMMUNITY): Payer: Self-pay | Admitting: Emergency Medicine

## 2011-09-11 MED ORDER — HYDRALAZINE HCL 50 MG PO TABS
50.0000 mg | ORAL_TABLET | Freq: Two times a day (BID) | ORAL | Status: DC
  Administered 2011-09-11 – 2011-09-12 (×2): 50 mg via ORAL
  Filled 2011-09-11 (×4): qty 1

## 2011-09-11 MED ORDER — HYDRALAZINE HCL 100 MG PO TABS: 50.0000 mg | ORAL_TABLET | Freq: Two times a day (BID) | ORAL | Status: DC

## 2011-09-11 MED ORDER — HYDRALAZINE HCL 20 MG/ML IJ SOLN
10.0000 mg | INTRAMUSCULAR | Status: AC
  Administered 2011-09-11: 10 mg via INTRAVENOUS
  Filled 2011-09-11 (×3): qty 0.5

## 2011-09-11 NOTE — Progress Notes (Deleted)
Pt has been anxious, timid. Frequently asking what to do, and stating "i don't know what i'm supposed to do". Pt c/o constipation. M.o.m. Given after lunch with no results thus far. Pt also c/o large bruise to left outer thigh, asking for aome "aspirin cream" to apply. Bruise not new, and is in stages of healing. Pt offered ice pack, but refused. Positive for groups. Denies s.i.

## 2011-09-11 NOTE — Progress Notes (Signed)
Pt has required frequent staff interventions this evening. He remains very labile, escalates without warning, is verbally aggressive and hostile. When this RN gently attempted to discuss compliance with treatment, pt shut the door stating, "I don't want to talk to you." BP remains elevated (see flowsheet) and he refuses hs CBG. Pt will have difficulty co-existing with roommate as evidenced by episode last night (see progress notes) and current behavior.  N Mashburn PA paged and assessment discussed. She does not wish to give a no roommate order at this time. Hctz 25mg  po daily order received and initiated for tomorrow morning. Explained to pt his BP would continue to be monitored and if unable to stabilize, further treatment would be warranted, most likely in the ED. Once pt was calmer this RN stressed to pt need to comply with treatment offered and asked that he speak respectfully to staff. Pt did apologize and states he will comply with VS and CBG in a.m. however he states he knows his body's insulin needs. He states an hs CBG is not needed since he does not check it at home. He also attributes irritability to initiation of rozerem which he refuses tonight. He endorses passive SI however denies a plan here on unit. No HI/AVH reported or observed. Lawrence Marseilles

## 2011-09-11 NOTE — Progress Notes (Signed)
Pt returned from e.d. Approx. 1300. Medications given at that time. Pt has been pleasant and cooperative. He did refuse his hctz however. Says it makes him "pee too much". Writer assessed vitals upon return from e.d. bp standing was 160/103. Pt denies s.i. C/o "tingling" in his toes.

## 2011-09-11 NOTE — ED Notes (Signed)
Patient at desk asking about being seen and medication, explained delay

## 2011-09-11 NOTE — ED Notes (Signed)
Called report to Marcelino Duster RN at [facility], Marcelino Duster RN states she has to talk with charge nurse before patient can be transferred back to [facility].

## 2011-09-11 NOTE — ED Notes (Signed)
Sitter escorted patient to the bathroom. 

## 2011-09-11 NOTE — Progress Notes (Signed)
Pt did not attend d/c planning group as pt was at the ED for high blood pressure.  SW met with pt individually at this time.  Pt presents with calm mood and affect.  Pt reports feeling better, now that his blood pressure is under control.  Pt reports not feeling as depressed and denies SI.  Pt states that he will be ready to d/c Wednesday or Thursday.  SW notes that this is when pt's disability check will come in.  Pt denied referral to assisted living facilities, stating they would take all of his money.  Pt states that he can't stay at a homeless shelter because he has been there within the time frame to return.  Pt states his d/c plan is to get a room at a boarding house and is familiar with available rooms in town.  SW scheduled pt to be seen at [facility] for follow up.  No further needs at this time.   [name], [name] 09/11/2011  1:48 PM    Per State Regulation 482.30 This Chart was reviewed for medical necessity with respect to the patient's Admission/Duration of stay.   [name]  09/11/2011  Next Review Date: 09/13/11

## 2011-09-11 NOTE — ED Notes (Signed)
Talked to Gary Fleet Charge Nurse at [facility], states that before patient is accepted back that Dr. Dan Humphreys needs to have a discussion with Dr. Orlinda Blalock because patient would not let staff take blood pressure manually, charge nurse feels this is a safety issue for the patient.

## 2011-09-11 NOTE — ED Notes (Signed)
EMS reports patient sent from [facility] for high blood, EMS reports patient is asymptomatic. No headache, blurred vision or nausea.

## 2011-09-11 NOTE — ED Notes (Signed)
Notified physician patient in room but not in computer system.

## 2011-09-11 NOTE — Progress Notes (Signed)
Pt's BP this morning is 174/121 sitting with a pulse of 74 RR 18. Upon standing VS are 175/137 with a pulse of 80. Pt denies any pain, symptoms associated with readings however pt is uncooperative and continues to shut the door when staff attempts repeated interaction . He refuses manual verification of BP reading. Dr. Elsie Saas paged. Explained to MD that [employer] PA and I had discussed Abou's BP readings at length last night and she had indicated a potential need for transport to ED should his pressures not stabilize. Order received to transport to ED with [facility] staff for evaluation. 911 called, report called to ED charge nurse. Pt informed and is accepting of plan. Awaiting EMS arrival. Lawrence Marseilles

## 2011-09-11 NOTE — ED Provider Notes (Signed)
History     CSN: [id number]  Arrival date & time 09/11/11  4098   First MD Initiated Contact with Patient 09/11/11 [phone]      Chief Complaint  Patient presents with  . Hypertension    (Consider location/radiation/quality/duration/timing/severity/associated sxs/prior treatment) Patient is a 51 y.o. male presenting with hypertension. The history is provided by the patient and medical records.  Hypertension Pertinent negatives include no chest pain, no abdominal pain, no headaches and no shortness of breath.   the patient is a 51 year old, male, with history of hypertension, and diabetes.  Currently, he is at behavioral health for suicidal ideations.  He was sent here for evaluation and treatment of his blood pressure.  They did a routine, but pressure checked and noted that it was elevated, so they sent him here.  The patient is completely asymptomatic denying pain, cough, shortness of breath.  Vision changes, headache, weakness, nausea, vomiting.  He currently takes metoprolol for his blood pressure  Past Medical History  Diagnosis Date  . Hypertension   . Diabetes mellitus   . Constipation   . Neuropathy   . Depression     History reviewed. No pertinent past surgical history.  History reviewed. No pertinent family history.  History  Substance Use Topics  . Smoking status: Never Smoker   . Smokeless tobacco: Not on file  . Alcohol Use: No      Review of Systems  Constitutional: Negative for fever and diaphoresis.  HENT: Negative for neck pain.   Eyes: Negative for visual disturbance.  Respiratory: Negative for cough, chest tightness and shortness of breath.   Cardiovascular: Negative for chest pain and palpitations.  Gastrointestinal: Negative for nausea, vomiting, abdominal pain and diarrhea.  Musculoskeletal: Negative for back pain.  Neurological: Negative for weakness and headaches.  Psychiatric/Behavioral: Negative for confusion.    Allergies  Ace  inhibitors  Home Medications   Current Outpatient Rx  Name Route Sig Dispense Refill  . CITALOPRAM HYDROBROMIDE 20 MG PO TABS Oral Take 20 mg by mouth daily.    Marland Kitchen DOCUSATE SODIUM-CASANTHRANOL 100-30 MG PO CAPS Oral Take 1 capsule by mouth 2 (two) times daily as needed. For constipation    . GLYBURIDE 5 MG PO TABS Oral Take 10 mg by mouth daily with breakfast.    . HYDROCHLOROTHIAZIDE 25 MG PO TABS Oral Take 25 mg by mouth every morning. Before meal    . METFORMIN HCL 500 MG PO TABS Oral Take 500 mg by mouth 2 (two) times daily with a meal.    . METOPROLOL TARTRATE 100 MG PO TABS Oral Take 100 mg by mouth daily.     Marland Kitchen PREGABALIN 75 MG PO CAPS Oral Take 75 mg by mouth 3 (three) times daily.    . TRAZODONE HCL 150 MG PO TABS Oral Take 150 mg by mouth at bedtime.      BP 165/103  Pulse 72  Temp(Src) 98.2 F (36.8 C) (Oral)  Resp 16  Ht 5\' 7"  (1.702 m)  Wt 220 lb 8 oz (100.018 kg)  BMI 34.54 kg/m2  SpO2 98%  Physical Exam  Vitals reviewed. Constitutional: He is oriented to person, place, and time. He appears well-developed and well-nourished. No distress.  HENT:  Head: Normocephalic and atraumatic.  Eyes: EOM are normal. Pupils are equal, round, and reactive to light.  Neck: Normal range of motion. Neck supple.  Cardiovascular: Normal rate, regular rhythm and normal heart sounds.   No murmur heard. Pulmonary/Chest: Effort normal and breath  sounds normal. No respiratory distress. He has no wheezes. He has no rales.  Abdominal: Soft. Bowel sounds are normal. He exhibits no distension and no mass. There is no tenderness. There is no rebound and no guarding.  Musculoskeletal: Normal range of motion. He exhibits no edema and no tenderness.  Neurological: He is alert and oriented to person, place, and time. No cranial nerve deficit.  Skin: Skin is warm and dry. He is not diaphoretic.  Psychiatric: He has a normal mood and affect. His behavior is normal.    ED Course  Procedures  (including critical care time) 51 year old, male, with known hypertension, was seen here for evaluation of mild hypertension.  He is completely asymptomatic.  We will give him another antihypertensive medication and in return him to behavior health.  There is no indication of endorgan damage, and there is no indication for testing in the emergency department.  At this time.  Labs Reviewed  GLUCOSE, CAPILLARY - Abnormal; Notable for the following:    Glucose-Capillary 170 (*)    All other components within normal limits  GLUCOSE, CAPILLARY - Abnormal; Notable for the following:    Glucose-Capillary 61 (*)    All other components within normal limits  HEMOGLOBIN A1C - Abnormal; Notable for the following:    Hemoglobin A1C 6.6 (*)    Mean Plasma Glucose 143 (*)    All other components within normal limits  GLUCOSE, CAPILLARY - Abnormal; Notable for the following:    Glucose-Capillary 155 (*)    All other components within normal limits  GLUCOSE, CAPILLARY - Abnormal; Notable for the following:    Glucose-Capillary 155 (*)    All other components within normal limits  GLUCOSE, CAPILLARY - Abnormal; Notable for the following:    Glucose-Capillary 146 (*)    All other components within normal limits  GLUCOSE, CAPILLARY - Abnormal; Notable for the following:    Glucose-Capillary 149 (*)    All other components within normal limits  GLUCOSE, CAPILLARY - Abnormal; Notable for the following:    Glucose-Capillary 111 (*)    All other components within normal limits  GLUCOSE, CAPILLARY - Abnormal; Notable for the following:    Glucose-Capillary 66 (*)    All other components within normal limits  GLUCOSE, CAPILLARY - Abnormal; Notable for the following:    Glucose-Capillary 137 (*)    All other components within normal limits  GLUCOSE, CAPILLARY - Abnormal; Notable for the following:    Glucose-Capillary 142 (*)    All other components within normal limits  GLUCOSE, CAPILLARY - Abnormal;  Notable for the following:    Glucose-Capillary 110 (*)    All other components within normal limits  GLUCOSE, CAPILLARY  URINALYSIS, ROUTINE W REFLEX MICROSCOPIC  GLUCOSE, CAPILLARY  GLUCOSE, CAPILLARY  POCT CBG MONITORING   No results found.   No diagnosis found.  11:06 AM i spoke with Dr. Dan Humphreys at [facility].  I told him the bp was improved but not perfect. I said I could write rx for new antihypertensive but no immediate more aggressive tx is indicated.   He accepted the pt back at [facility].    MDM  Hypertension, with no evidence of endorgan damage        Nicholes Stairs, MD 09/11/11 1108

## 2011-09-11 NOTE — ED Notes (Signed)
WUJ:[id number]<JY> Expected date:09/11/11<BR> Expected time: 7:19 AM<BR> Means of arrival:Ambulance<BR> Comments:<BR> [facility] pt transfer Harm Swearingin-hypertensive

## 2011-09-11 NOTE — Progress Notes (Signed)
Patient ID: [name], male   DOB: 07-27-1961, 51 y.o.   MRN: [id number] Pt reports that his suicidal thoughts are getting much better and that he thinks he will be ready to leave Wed in 2 days. He went to the ED and got started on Apresoline.  His bp is doing a lot better. He was not cooperative with rechecking his bp this AM, but agrees to do so in the futjure.

## 2011-09-12 LAB — GLUCOSE, CAPILLARY: Glucose-Capillary: 106 mg/dL — ABNORMAL HIGH (ref 70–99)

## 2011-09-12 MED ORDER — HYDRALAZINE HCL 10 MG PO TABS
10.0000 mg | ORAL_TABLET | ORAL | Status: AC
  Administered 2011-09-12: 10 mg via ORAL
  Filled 2011-09-12: qty 1

## 2011-09-12 MED ORDER — HYDRALAZINE HCL 25 MG PO TABS
75.0000 mg | ORAL_TABLET | Freq: Two times a day (BID) | ORAL | Status: DC
  Administered 2011-09-12 – 2011-09-13 (×2): 75 mg via ORAL
  Filled 2011-09-12: qty 3
  Filled 2011-09-12: qty 12
  Filled 2011-09-12 (×3): qty 3
  Filled 2011-09-12: qty 12

## 2011-09-12 NOTE — Progress Notes (Signed)
[facility] Group Notes: (Counselor/Nursing/MHT/Case Management/Adjunct)  09/12/2011 @1 :15pm  Type of Therapy: Group Therapy   Participation Level: Active   Participation Quality: Attentive, Appropriate  Affect: Appropriate   Cognitive: Appropriate   Insight: Good   Engagement in Group: Good   Engagement in Therapy: Limited   Modes of Intervention: Support and Exploration   Summary of Progress/Problems: [name] participated in mindful deep breathing and progressive muscle relaxation exercises. He did not process his experience afterward.   Billie Lade 09/12/2011  3:47 PM

## 2011-09-12 NOTE — Progress Notes (Signed)
Patient ID: [name], male   DOB: [birth year], 51 y.o.   MRN: [id number] Patient was pleasant and cooperative during the assessment. Pt informed the writer that after discharge he plans to return to his home and [place]  and live with his sister for a while.  Writer informed pt that she was aware that he was given the rozerem that he requested. Pt stated, "It don't work". However, the next sentence was that the med makes him too sleepy the next morning. Support and encouragement was offered.

## 2011-09-12 NOTE — Progress Notes (Signed)
Pt walking away from writer and shaking his head when writer questioned about si. Pt's mood is labile and he became defensive when writer offered scheduled blood pressure medication and 1200 insulin. Pt reports that HCTZ makes him go to the bathroom and he will not take the medication. Pt stated that his blood pressure is ok and he does not need insulin. Tried to explain to pt that insulin is for his blood sugar. He continues to refuse. Reported to PA. Asked pt again about si and he stated that it was getting better. Encouraged pt the complete self inventory sheet and he said maybe later in the day. Currently, pt is in his room coloring a picture. Safety maintained.

## 2011-09-12 NOTE — Progress Notes (Signed)
[facility] Group Notes:  (Counselor/Nursing/MHT/Case Management/Adjunct)    Type of Therapy:  Group Therapy  Participation Level:  Did Not Attend       [name] 09/12/2011  2:29 PM

## 2011-09-12 NOTE — Progress Notes (Signed)
Pt attended discharge planning group and minimally participated.  Pt ranks depression at a 2-3 and denies anxiety today.  Pt reports some SI but states that it's just thoughts today; pt contracts for safety.  Pt states he is feeling better, regarding his blood pressure.  Pt reports feeling ready to d/c later this week.  Pt will make own arrangements for housing upon d/c.  Pt has follow up scheduled at [facility] for medication management and therapy.  No further needs at this time.  Safety planning and suicide prevention discussed.    Reyes Ivan, LCSWA 09/12/2011  9:42 AM

## 2011-09-12 NOTE — Tx Team (Signed)
Interdisciplinary Treatment Plan Update (Adult)  Date:  09/12/2011  Time Reviewed:  10:59 AM   Progress in Treatment: Attending groups: Yes Participating in groups:  Yes Taking medication as prescribed: Yes Tolerating medication:  Yes Family/Significant othe contact made:  Patient understands diagnosis:  Yes Discussing patient identified problems/goals with staff:  Yes Medical problems stabilized or resolved:  Yes Denies suicidal/homicidal ideation: Yes Issues/concerns per patient self-inventory:  None identified Other: N/A  New problem(s) identified: None Identified  Reason for Continuation of Hospitalization: Anxiety Depression Medication stabilization  Interventions implemented related to continuation of hospitalization: mood stabilization, medication monitoring and adjustment, group therapy and psycho education, safety checks q 15 mins  Additional comments: N/A  Estimated length of stay: 3-5 days  Discharge Plan: Pt will follow up at [facility] for medication management and therapy.  Pt discussed his plan to find a boarding room upon d/c.    New goal(s): N/A  Review of initial/current patient goals per problem list:    1.  Goal(s): Reduce depressive symptoms  Met:  Yes  Target date: by discharge  As evidenced by: Reducing depression from a 10 to a 3 as reported by pt. Pt ranks at a 2-3 today.  2.  Goal (s): Reduce/Eliminate suicidal ideation  Met:  No  Target date: by discharge  As evidenced by: pt reporting no SI.    3.  Goal(s): Reduce anxiety symptoms  Met:  Yes  Target date: by discharge  As evidenced by: Reduce anxiety from a 10 to a 3 as reported by pt.  Pt denies anxiety today.    Attendees: Patient:     Family:     Physician:  Orson Aloe, MD  09/12/2011  10:59 AM   Nursing:      Case Manager:  Reyes Ivan, LCSWA 09/12/2011  10:59 AM   Counselor:  Angus Palms, LCSW 09/12/2011  10:59 AM   Other:  Juline Patch, LCSW 09/12/2011  10:59  AM   Other:  Serena Colonel, NP 09/12/2011 11:05 AM   Other:  Marni Griffon, LCAS 09/12/2011 11:05 AM   Other:      Scribe for Treatment Team:   Carmina Miller, 09/12/2011 , 10:59 AM

## 2011-09-12 NOTE — Progress Notes (Signed)
Patient ID: [name], male   DOB: 02-Sep-1960, 51 y.o.   MRN: [id number] [name]  51 y.o.  [id number] Oct 21, 1960  09/12/2011   Diagnosis: Major depressive disorder                     Multiple medical problems                     R/O sleep apnea   Subjective: Pt. States he is doing better today. He reports that his blood pressure is under better control, yet the nurse practitioner says that he has been refusing the HCTZ because it makes him urinate more than he wants, but he is willing to take another antihypertensive and that that might bring his pressure down.  He denies any suicidal thoughts and feels that he has learned some coping techniques to deal with those thoughts in the future.  He says that his mood is better and he feels fine and ready to go in the AM.  Will plan for that.  He is interested in getting evaluated for sleep apnea after discharge.  Objective:Vital Signs:Blood pressure 160/109, pulse 80, temperature 97.8 F (36.6 C), temperature source Oral, resp. rate 20, height 5\' 7"  (1.702 m), weight 220 lb 8 oz (100.018 kg).  Assessment  MDD recurrent                        Uncontrolled hypertension                        Type 2 DM                        Increased risk for sleep apnea.   Medications Scheduled: . docusate sodium  100 mg Oral BID  . glipiZIDE  10 mg Oral Q breakfast  . hydrALAZINE  10 mg Oral 1 day or 1 dose  . hydrALAZINE  75 mg Oral BID  . insulin aspart  0-15 Units Subcutaneous TID WC  . metFORMIN  500 mg Oral BID WC  . metoprolol tartrate  150 mg Oral BID  . pregabalin  75 mg Oral TID  . ramelteon  8 mg Oral QHS  . traZODone  150 mg Oral QHS  . DISCONTD: hydrALAZINE  50 mg Oral BID  . DISCONTD: hydrochlorothiazide  25 mg Oral Daily     PRN Meds acetaminophen, alum & mag hydroxide-simeth, bisacodyl, magnesium hydroxide  Plan:  Continue current plan with the changes made by nurse practitioner.   Dan Humphreys, Diesel Lina 09/12/2011 9:22 AM

## 2011-09-12 NOTE — Progress Notes (Signed)
[facility] MD Progress Note  09/12/2011 3:27 PM  Patient is noted to be declining to take his HCTZ. States, "It makes me pee all day. I can't handle it. Nothing is going to make me take that medicine. I know it is for my blood pressure, I rather stay on Metoprolol and hydralazine" Patient has been informed of the need for HCTZ.   Diagnosis:   Axis I: Major depressive disorder, recurrent Axis II: Deferred Axis III:  Past Medical History  Diagnosis Date  . Hypertension   . Diabetes mellitus   . Constipation   . Neuropathy   . Depression    Axis IV: economic problems, housing problems and occupational problems Axis V: 51-60 moderate symptoms  ADL's:  Intact  Sleep: Good  Appetite:  Good  Suicidal Ideation:  Plan:  None Intent:  None Means:  None Homicidal Ideation:  Plan:  None Intent:  None Means:  None  AEB (as evidenced by): per patient's reports.  Mental Status Examination/Evaluation: Objective:  Appearance: Casual  Eye Contact::  Good  Speech:  Clear and Coherent  Volume:  Normal  Mood:  Euthymic  Affect:  Appropriate  Thought Process:  Coherent  Orientation:  Full  Thought Content:  irrational  Suicidal Thoughts:  No  Homicidal Thoughts:  No  Memory:  Immediate;   Good Recent;   Good Remote;   Good  Judgement:  Fair  Insight:  Fair  Psychomotor Activity:  Normal  Concentration:  Good  Recall:  Good  Akathisia:  No  Handed:  Right  AIMS (if indicated):     Assets:  Desire for Improvement  Sleep:  Number of Hours: 5.75    Vital Signs:Blood pressure 151/103, pulse 78, temperature 96.5 F (35.8 C), temperature source Oral, resp. rate 20, height 5\' 7"  (1.702 m), weight 100.018 kg (220 lb 8 oz), SpO2 99.00%. Current Medications: Current Facility-Administered Medications  Medication Dose Route Frequency Provider Last Rate Last Dose  . acetaminophen (TYLENOL) tablet 650 mg  650 mg Oral Q6H PRN Syed T. Arfeen, MD      . alum & mag hydroxide-simeth  (MAALOX/MYLANTA) 200-200-20 MG/5ML suspension 30 mL  30 mL Oral Q4H PRN Syed T. Arfeen, MD      . bisacodyl (DULCOLAX) EC tablet 5 mg  5 mg Oral Daily PRN Verne Spurr, PA   5 mg at 09/12/11 0854  . docusate sodium (COLACE) capsule 100 mg  100 mg Oral BID Mickie D. Adams, PA   100 mg at 09/12/11 0850  . glipiZIDE (GLUCOTROL XL) 24 hr tablet 10 mg  10 mg Oral Q breakfast Orson Aloe, MD   10 mg at 09/12/11 0850  . hydrALAZINE (APRESOLINE) tablet 10 mg  10 mg Oral 1 day or 1 dose Sanjuana Kava, NP   10 mg at 09/12/11 1207  . hydrALAZINE (APRESOLINE) tablet 75 mg  75 mg Oral BID Sanjuana Kava, NP      . insulin aspart (novoLOG) injection 0-15 Units  0-15 Units Subcutaneous TID WC Syed T. Arfeen, MD   3 Units at 09/11/11 1704  . magnesium hydroxide (MILK OF MAGNESIA) suspension 30 mL  30 mL Oral Daily PRN Syed T. Arfeen, MD   30 mL at 09/12/11 1515  . metFORMIN (GLUCOPHAGE) tablet 500 mg  500 mg Oral BID WC Syed T. Arfeen, MD   500 mg at 09/12/11 0852  . metoprolol (LOPRESSOR) tablet 150 mg  150 mg Oral BID Sanjuana Kava, NP   150 mg  at 09/12/11 0852  . pregabalin (LYRICA) capsule 75 mg  75 mg Oral TID Syed T. Arfeen, MD   75 mg at 09/12/11 1207  . ramelteon (ROZEREM) tablet 8 mg  8 mg Oral QHS Orson Aloe, MD   8 mg at 09/09/11 2140  . traZODone (DESYREL) tablet 150 mg  150 mg Oral QHS Orson Aloe, MD   150 mg at 09/11/11 2215  . DISCONTD: hydrALAZINE (APRESOLINE) tablet 50 mg  50 mg Oral BID Orson Aloe, MD   50 mg at 09/12/11 0851  . DISCONTD: hydrochlorothiazide (HYDRODIURIL) tablet 25 mg  25 mg Oral Daily Verne Spurr, [place]        Lab Results:  Results for orders placed during the hospital encounter of 09/07/11 (from the past 48 hour(s))  GLUCOSE, CAPILLARY     Status: Normal   Collection Time   09/10/11  5:06 PM      Component Value Range Comment   Glucose-Capillary 90  70 - 99 (mg/dL)   GLUCOSE, CAPILLARY     Status: Abnormal   Collection Time   09/11/11  5:50 AM      Component Value  Range Comment   Glucose-Capillary 110 (*) 70 - 99 (mg/dL)   GLUCOSE, CAPILLARY     Status: Abnormal   Collection Time   09/11/11  4:41 PM      Component Value Range Comment   Glucose-Capillary 182 (*) 70 - 99 (mg/dL)    Comment 1 Notify RN     GLUCOSE, CAPILLARY     Status: Abnormal   Collection Time   09/12/11  6:15 AM      Component Value Range Comment   Glucose-Capillary 106 (*) 70 - 99 (mg/dL)   GLUCOSE, CAPILLARY     Status: Abnormal   Collection Time   09/12/11 11:52 AM      Component Value Range Comment   Glucose-Capillary 128 (*) 70 - 99 (mg/dL)    Comment 1 Notify RN      Comment 2 Documented in Chart       Physical Findings: AIMS:  , ,  ,  ,    CIWA:    COWS:     Treatment Plan Summary: Daily contact with patient to assess and evaluate symptoms and progress in treatment Medication management  Plan: Discontinue HCTZ, patient declined to take.           Increase Hydralazine from 50 mg to 75 mg.           Give hydralazine 10 mg x 1 for increased BP. Recheck BP in 45 minutes.           Continue current treatment plan.  Armandina Stammer I 09/12/2011, 3:27 PM

## 2011-09-13 DIAGNOSIS — F339 Major depressive disorder, recurrent, unspecified: Principal | ICD-10-CM

## 2011-09-13 LAB — GLUCOSE, CAPILLARY
Glucose-Capillary: 140 mg/dL — ABNORMAL HIGH (ref 70–99)
Glucose-Capillary: 105 mg/dL — ABNORMAL HIGH (ref 70–99)

## 2011-09-13 MED ORDER — METOPROLOL TARTRATE 100 MG PO TABS: 150.0000 mg | ORAL_TABLET | Freq: Two times a day (BID) | ORAL | Status: DC

## 2011-09-13 MED ORDER — METFORMIN HCL 500 MG PO TABS: 500.0000 mg | ORAL_TABLET | Freq: Two times a day (BID) | ORAL | Status: DC

## 2011-09-13 MED ORDER — PREGABALIN 75 MG PO CAPS: 75.0000 mg | ORAL_CAPSULE | Freq: Three times a day (TID) | ORAL | Status: DC

## 2011-09-13 MED ORDER — GLIPIZIDE ER 10 MG PO TB24: 10.0000 mg | ORAL_TABLET | Freq: Every day | ORAL | Status: DC

## 2011-09-13 MED ORDER — METOPROLOL TARTRATE 100 MG PO TABS: 100.0000 mg | ORAL_TABLET | Freq: Every day | ORAL | Status: DC

## 2011-09-13 MED ORDER — DOCUSATE SODIUM-CASANTHRANOL 100-30 MG PO CAPS: 1.0000 | ORAL_CAPSULE | Freq: Two times a day (BID) | ORAL | Status: DC | PRN

## 2011-09-13 MED ORDER — CITALOPRAM HYDROBROMIDE 20 MG PO TABS
20.0000 mg | ORAL_TABLET | Freq: Every day | ORAL | Status: DC
  Filled 2011-09-13 (×2): qty 14

## 2011-09-13 MED ORDER — METOPROLOL TARTRATE 50 MG PO TABS
150.0000 mg | ORAL_TABLET | Freq: Two times a day (BID) | ORAL | Status: DC
  Filled 2011-09-13 (×2): qty 12

## 2011-09-13 MED ORDER — HYDRALAZINE HCL 25 MG PO TABS: 75.0000 mg | ORAL_TABLET | Freq: Two times a day (BID) | ORAL | Status: DC

## 2011-09-13 MED ORDER — TRAZODONE HCL 150 MG PO TABS: 150.0000 mg | ORAL_TABLET | Freq: Every day | ORAL | Status: DC

## 2011-09-13 MED ORDER — CITALOPRAM HYDROBROMIDE 20 MG PO TABS: 20.0000 mg | ORAL_TABLET | Freq: Every day | ORAL | Status: DC

## 2011-09-13 NOTE — Progress Notes (Signed)
Pt D/C and sent home. Pt rx and follow-up visits were discussed and pt verbalized understanding. Pt denies SI/HI. Pt belongings were returned.

## 2011-09-13 NOTE — Progress Notes (Signed)
Patient ID: [name], male   DOB: 06-22-1961, 51 y.o.   MRN: [id number]   Pt stated that he's supposed to be discharged tomorrow. But stated that he may not leave until Thursday, because then he'd only have a day to wait until his check comes.  States after discharge he plans to go live with his sister. Support and encouragement was offered.

## 2011-09-13 NOTE — Progress Notes (Signed)
[facility] Inpatient Family/Significant Other Suicide Prevention Education  Suicide Prevention Education:  Patient Refusal for Family/Significant Other Suicide Prevention Education: The patient [name] has refused to provide written consent for family/significant other to be provided Family/Significant Other Suicide Prevention Education during admission and/or prior to discharge.  Physician notified.  Gray will not allow anyone to be contacted; states there is no one supportive to call. Counselor reviewed suicide prevention information with Captain and provided him with [facility] suicide prevention pamphlet. Markie verbalized understanding of information discussed therein and had no further questions. He denies access to weapons.   Billie Lade 09/13/2011, 11:27 AM

## 2011-09-13 NOTE — Tx Team (Signed)
Interdisciplinary Treatment Plan Update (Adult)  Date:  09/13/2011  Time Reviewed:  10:17 AM   Progress in Treatment: Attending groups: Yes Participating in groups:  Yes Taking medication as prescribed: Yes Tolerating medication:  Yes Family/Significant othe contact made:  Pt refused contact Patient understands diagnosis:  Yes Discussing patient identified problems/goals with staff:  Yes Medical problems stabilized or resolved:  Yes Denies suicidal/homicidal ideation: Yes Issues/concerns per patient self-inventory:  None identified Other: N/A  New problem(s) identified: None Identified  Reason for Continuation of Hospitalization: Stable to d/c  Interventions implemented related to continuation of hospitalization: Stable to d/c  Additional comments: N/A  Estimated length of stay: D/C today  Discharge Plan: Pt will follow up at [facility] for medication management and therapy   New goal(s): N/A  Review of initial/current patient goals per problem list:    1.  Goal(s): Reduce depressive symptoms  Met:  Yes  Target date: by discharge  As evidenced by: Reducing depression from a 10 to a 3 as reported by pt.   2.  Goal (s): Reduce/Eliminate suicidal ideation  Met:  Yes  Target date: by discharge  As evidenced by: pt reporting no SI.    3.  Goal(s): Reduce anxiety symptoms  Met:  Yes  Target date: by discharge  As evidenced by: Reduce anxiety from a 10 to a 3 as reported by pt.    Attendees: Patient:     Family:     Physician:  Orson Aloe, MD  09/13/2011  10:17 AM   Nursing:      Case Manager:  Reyes Ivan, LCSWA 09/13/2011  10:17 AM   Counselor:  Angus Palms, LCSW 09/13/2011  10:17 AM   Other:  Juline Patch, LCSW 09/13/2011  10:17 AM   Other:  Magdalen Spatz, counseling intern 09/13/2011  10:17 AM   Other:  Wilmon Arms, counseling intern 09/13/2011 10:17 AM   Other:      Scribe for Treatment Team:   Carmina Miller, 09/13/2011 , 10:17  AM

## 2011-09-13 NOTE — [facility] Suicide Risk Assessment (Signed)
Suicide Risk Assessment  Discharge Assessment     Demographic factors:  Assessment Details Time of Assessment: Admission Information Obtained From: Patient Current Mental Status:  Current Mental Status: Suicidal ideation indicated by patient;Suicide plan;Belief that plan would result in death Risk Reduction Factors:  Risk Reduction Factors: Religious beliefs about death  CLINICAL FACTORS:   Depression:   Anhedonia Hopelessness Chronic Pain Previous Psychiatric Diagnoses and Treatments Medical Diagnoses and Treatments/Surgeries  COGNITIVE FEATURES THAT CONTRIBUTE TO RISK:  Closed-mindedness Thought constriction (tunnel vision)    SUICIDE RISK:   Minimal: No identifiable suicidal ideation.  Patients presenting with no risk factors but with morbid ruminations; may be classified as minimal risk based on the severity of the depressive symptoms  Diagnosis:  Axis I: Major Depression, Recurrent severe  ADL's:  Intact  Sleep: Fair  Appetite:  Good  Suicidal Ideation:  Denies adamantly any suicidal thoughts. Homicidal Ideation:  Denies adamantly any homicidal thoughts.  Mental Status Examination/Evaluation: Objective:  Appearance: Casual  Eye Contact::  Good  Speech:  Clear and Coherent  Volume:  Normal  Mood:  2/10 on a scale of 1 is the best and 10 is the worst Anxiety: 1/10 on the same scale  Affect:  Congruent  Thought Process:  Coherent  Orientation:  Full  Thought Content:  WDL  Suicidal Thoughts:  No  Homicidal Thoughts:  No  Memory:  Immediate;   Good Recent;   Good Remote;   Good  Judgement:  Fair  Insight:  Fair  Psychomotor Activity:  Normal  Concentration:  Good  Recall:  Good  Akathisia:  No  Handed:  Right  AIMS (if indicated):     Assets:  Communication Skills Desire for Improvement Financial Resources/Insurance Intimacy Leisure Time Resilience  Sleep:  Number of Hours: 6.5     Vital Signs: Blood pressure 150/94, pulse 85, temperature 98.1 F  (36.7 C), temperature source Oral, resp. rate 18, height 5\' 7"  (1.702 m), weight 100.018 kg (220 lb 8 oz), SpO2 99.00%. Current Medications:  Current Facility-Administered Medications  Medication Dose Route Frequency Provider Last Rate Last Dose  . acetaminophen (TYLENOL) tablet 650 mg  650 mg Oral Q6H PRN Syed T. Arfeen, MD      . alum & mag hydroxide-simeth (MAALOX/MYLANTA) 200-200-20 MG/5ML suspension 30 mL  30 mL Oral Q4H PRN Syed T. Arfeen, MD      . bisacodyl (DULCOLAX) EC tablet 5 mg  5 mg Oral Daily PRN Verne Spurr, PA   5 mg at 09/12/11 0854  . docusate sodium (COLACE) capsule 100 mg  100 mg Oral BID Mickie D. Adams, PA   100 mg at 09/13/11 0818  . glipiZIDE (GLUCOTROL XL) 24 hr tablet 10 mg  10 mg Oral Q breakfast Orson Aloe, MD   10 mg at 09/13/11 0817  . hydrALAZINE (APRESOLINE) tablet 10 mg  10 mg Oral 1 day or 1 dose Sanjuana Kava, NP   10 mg at 09/12/11 1207  . hydrALAZINE (APRESOLINE) tablet 75 mg  75 mg Oral BID Sanjuana Kava, NP   75 mg at 09/13/11 0818  . insulin aspart (novoLOG) injection 0-15 Units  0-15 Units Subcutaneous TID WC Syed T. Arfeen, MD   3 Units at 09/11/11 1704  . magnesium hydroxide (MILK OF MAGNESIA) suspension 30 mL  30 mL Oral Daily PRN Syed T. Arfeen, MD   30 mL at 09/12/11 1515  . metFORMIN (GLUCOPHAGE) tablet 500 mg  500 mg Oral BID WC Syed T. Lolly Mustache, MD  500 mg at 09/13/11 0817  . metoprolol (LOPRESSOR) tablet 150 mg  150 mg Oral BID Sanjuana Kava, NP   150 mg at 09/13/11 0818  . pregabalin (LYRICA) capsule 75 mg  75 mg Oral TID Syed T. Arfeen, MD   75 mg at 09/13/11 0843  . ramelteon (ROZEREM) tablet 8 mg  8 mg Oral QHS Orson Aloe, MD   8 mg at 09/09/11 2140  . traZODone (DESYREL) tablet 150 mg  150 mg Oral QHS Orson Aloe, MD   150 mg at 09/12/11 2159  . DISCONTD: hydrALAZINE (APRESOLINE) tablet 50 mg  50 mg Oral BID Orson Aloe, MD   50 mg at 09/12/11 0851  . DISCONTD: hydrochlorothiazide (HYDRODIURIL) tablet 25 mg  25 mg Oral Daily Verne Spurr, [place]        Lab Results:  Results for orders placed during the hospital encounter of 09/07/11 (from the past 48 hour(s))  GLUCOSE, CAPILLARY     Status: Abnormal   Collection Time   09/11/11  4:41 PM      Component Value Range Comment   Glucose-Capillary 182 (*) 70 - 99 (mg/dL)    Comment 1 Notify RN     GLUCOSE, CAPILLARY     Status: Abnormal   Collection Time   09/12/11  6:15 AM      Component Value Range Comment   Glucose-Capillary 106 (*) 70 - 99 (mg/dL)   GLUCOSE, CAPILLARY     Status: Abnormal   Collection Time   09/12/11 11:52 AM      Component Value Range Comment   Glucose-Capillary 128 (*) 70 - 99 (mg/dL)    Comment 1 Notify RN      Comment 2 Documented in Chart     GLUCOSE, CAPILLARY     Status: Normal   Collection Time   09/12/11  5:01 PM      Component Value Range Comment   Glucose-Capillary 95  70 - 99 (mg/dL)    Comment 1 Notify RN      Comment 2 Documented in Chart     GLUCOSE, CAPILLARY     Status: Abnormal   Collection Time   09/13/11  6:26 AM      Component Value Range Comment   Glucose-Capillary 140 (*) 70 - 99 (mg/dL)    Physical Findings: AIMS:   CIWA:     COWS:      Reports what he has learned from this hospital stay is there are things to get yourself away from depression.  Do things, music, exercise, yoga, read a book, talk to someone.  Risk of harm to self is elevated by his history of 3 prior attempts,  He hopes this was his last time.  He is glad to learn som coping skills and plans on using them.  Risk of harm to others is minimal in that he has not been involved in fights and has no legal charges for any violence  Plan: Discharge as he has reached hospital goals, may move back to [place]. Continue Medication List  As of 09/13/2011  9:54 AM   STOP taking these medications         glyBURIDE 5 MG tablet      hydrochlorothiazide 25 MG tablet         TAKE these medications         citalopram 20 MG tablet   Commonly known as:  CELEXA   Take 1 tablet (20 mg total) by mouth daily. For  depression      docusate-casanthranol 100-30 MG per capsule   Commonly known as: PERICOLACE   Take 1 capsule by mouth 2 (two) times daily as needed. For constipation      glipiZIDE 10 MG 24 hr tablet   Commonly known as: GLUCOTROL XL   Take 1 tablet (10 mg total) by mouth daily with breakfast. For control of blood sugar      hydrALAZINE 100 MG tablet   Commonly known as: APRESOLINE   Take 0.5 tablets (50 mg total) by mouth 2 (two) times daily.      hydrALAZINE 25 MG tablet   Commonly known as: APRESOLINE   Take 3 tablets (75 mg total) by mouth 2 (two) times daily. For control of high blood pressure      metFORMIN 500 MG tablet   Commonly known as: GLUCOPHAGE   Take 1 tablet (500 mg total) by mouth 2 (two) times daily with a meal. For control of blood sugar      metoprolol 100 MG tablet   Commonly known as: LOPRESSOR   Take 1 tablet (100 mg total) by mouth daily. For control of high blood pressure      pregabalin 75 MG capsule   Commonly known as: LYRICA   Take 1 capsule (75 mg total) by mouth 3 (three) times daily. For peripheral neuropathy      traZODone 150 MG tablet   Commonly known as: DESYREL   Take 1 tablet (150 mg total) by mouth at bedtime. For insomnia          Jacarra Bobak 09/13/2011, 9:53 AM

## 2011-09-13 NOTE — Progress Notes (Addendum)
[facility] Case Management Discharge Plan:  Will you be returning to the same living situation after discharge: No. At discharge, do you have transportation home?:Yes,  pt provided bus pass  Do you have the ability to pay for your medications:Yes,  provided samples and access to meds  Interagency Information:   Release of information consent forms completed and in the chart; pt's signature needed at discharge.   Release of information consent forms completed and in the chart;  Patient's signature needed at discharge.  Patient to Follow up at:  Follow-up Information    Follow up with [facility] on 09/21/2011. (Appointment scheduled at 12:15 pm with Dr. Dicky Doe)    Contact information:   [address], [place] [address] [phone]         Patient denies SI/HI:   Yes,  denies SI/HI today    Safety Planning and Suicide Prevention discussed:  Yes,  discussed with pt  Barrier to discharge identified:No.  Summary and Recommendations: Pt did not attend d/c planning group on this date.  SW met with pt individually at this time.  Pt reports feeling stable to d/c.  Pt denies depressive and anxiety symptoms.  Pt plans to look for housing at a boarding house upon d/c.  No recommendations from SW.  No further needs voiced by pt.  Pt stable to discharge.     Carmina Miller 09/13/2011, 10:06 AM  Per State Regulation 482.30 This Chart was reviewed for medical necessity with respect to the patient's Admission/Duration of stay.   Carmina Miller  09/13/2011  Next Review Date:  09/15/11

## 2011-09-13 NOTE — Progress Notes (Signed)
[facility] Group Notes:  (Counselor/Nursing/MHT/Case Management/Adjunct)  09/13/2011 2:13 PM  Type of Therapy:  Group Therapy  Participation Level:  Did Not Attend  [name] 09/13/2011, 2:13 PM  Cosigned by: Angus Palms, LCSW

## 2011-09-14 NOTE — Discharge Summary (Signed)
Physician Discharge Summary Note  Patient:  [name] is an 51 y.o., male MRN:  [id number] DOB:  15-Aug-1960 Patient phone:  [phone] (home)  Patient address:   [address]  [place] [address],   Date of Admission:  09/07/2011 Date of Discharge: 09/13/11  Discharge Diagnoses: Active Problems:  Hypertension  Diabetes mellitus  Suicidal ideation  Needs sleep apnea assessment   Axis Diagnosis:   AXIS I:  Major depressive disorder, recurrent AXIS II:  Deferred AXIS III:   Past Medical History  Diagnosis Date  . Hypertension   . Diabetes mellitus   . Constipation   . Neuropathy   . Depression    AXIS IV:  economic problems, housing problems, occupational problems and other psychosocial or environmental problems AXIS V:  61-70 mild symptoms  Level of Care:  OP  Hospital Course:  Patient is a 51 year old African-American male, admitted to [facility] from the [facility] ED with complaints of suicidal ideation. Patient reports, "I became suicidal because I was living with bunch of ass holes in a boarding house in [facility]. All these folks do is to sit around, smoke weed and do drugs. I got fed up with it. I could not take that any more. I asked for the remainder of my rent money and headed down to [facility] I have been here in [place] for 2 weeks. I am down on my luck right now. That is why I am feeling like I don't belong in this world no more. While a patient in this hospital, Patient was not very co-operative with treatment regimen. On numerous occasions refused to participate in group counseling. He has the tendency to refuse pertinent medication for his medical condition. He was transferred to the ED on one occasion due to increased blood pressure. He finally participated in some group therapy and still refused to take his medication at times. Patient is being discharged to his place of residence because he has shown improved symptoms.  He has been informed that for his mood and medical condition to remain stable, he necessarily will need to take all medications as prescribed and keep all scheduled follow-up care appointments. Patient left [facility] with all personal belongings. He was provided with 2 weeks worth supply of his discharged medications. Bus pass provided. He will follow-up at [facility]. Address,date and time of appointment provided for patient.    Consults:  None  Significant Diagnostic Studies:  labs: Urinalysis, HGBA1C  Discharge Vitals:   Blood pressure 150/94, pulse 85, temperature 98.1 F (36.7 C), temperature source Oral, resp. rate 18, height 5\' 7"  (1.702 m), weight 100.018 kg (220 lb 8 oz), SpO2 99.00%.  Mental Status Exam: See Mental Status Examination and Suicide Risk Assessment completed by Attending Physician prior to discharge.  Discharge destination:  Home  Is patient on multiple antipsychotic therapies at discharge:  No   Has Patient had three or more failed trials of antipsychotic monotherapy by history:  No  Recommended Plan for Multiple Antipsychotic Therapies: NA   Medication List  As of 09/14/2011  8:33 AM   START taking these medications         glipiZIDE 10 MG 24 hr tablet   Commonly known as: GLUCOTROL XL   Take 1 tablet (10 mg total) by mouth daily with breakfast. For control of blood sugar      hydrALAZINE 25 MG tablet   Commonly known as: APRESOLINE   Take 3 tablets (75  mg total) by mouth 2 (two) times daily. For control of high blood pressure         CHANGE how you take these medications         citalopram 20 MG tablet   Commonly known as: CELEXA   Take 1 tablet (20 mg total) by mouth daily. For depression   What changed: doctor's instructions      metFORMIN 500 MG tablet   Commonly known as: GLUCOPHAGE   Take 1 tablet (500 mg total) by mouth 2 (two) times daily with a meal. For control of blood sugar   What changed: doctor's instructions      metoprolol 100 MG tablet     Commonly known as: LOPRESSOR   Take 1.5 tablets (150 mg total) by mouth 2 (two) times daily.   What changed: - dose - how often to take the med      pregabalin 75 MG capsule   Commonly known as: LYRICA   Take 1 capsule (75 mg total) by mouth 3 (three) times daily. For peripheral neuropathy   What changed: doctor's instructions      traZODone 150 MG tablet   Commonly known as: DESYREL   Take 1 tablet (150 mg total) by mouth at bedtime. For insomnia   What changed: doctor's instructions         CONTINUE taking these medications         docusate-casanthranol 100-30 MG per capsule   Commonly known as: PERICOLACE   Take 1 capsule by mouth 2 (two) times daily as needed. For constipation         STOP taking these medications         glyBURIDE 5 MG tablet      hydrochlorothiazide 25 MG tablet          Where to get your medications    These are the prescriptions that you need to pick up.   You may get these medications from any pharmacy.         citalopram 20 MG tablet   docusate-casanthranol 100-30 MG per capsule   glipiZIDE 10 MG 24 hr tablet   hydrALAZINE 25 MG tablet   metFORMIN 500 MG tablet   pregabalin 75 MG capsule   traZODone 150 MG tablet         Information on where to get these meds is not yet available. Ask your nurse or doctor.         metoprolol 100 MG tablet           Follow-up Information    Follow up with [facility] on 09/21/2011. (Appointment scheduled at 12:15 pm with Dr. Dicky Doe)    Contact information:   [address], [place] [address] [phone]         Follow-up recommendations:  Other:  Keep all scheduled follow-up care appointments.  Comments:  Take all medications as prescribed.  SignedArmandina Stammer I 09/14/2011, 8:33 AM

## 2011-09-14 NOTE — Progress Notes (Signed)
Patient Discharge Instructions:  Admission Note Faxed,  09/14/2011 After Visit Summary Faxed,  09/14/2011 Faxed to the [facility] provider:  09/14/2011 D/C Summary Note faxed 09/14/2011 Facesheet faxed 09/14/2011  Faxed to [facility] @ [phone]   Heloise Purpura, Eduard Clos, 09/14/2011, 6:01 PM

## 2017-04-16 DIAGNOSIS — I739 Peripheral vascular disease, unspecified: Secondary | ICD-10-CM | POA: Insufficient documentation

## 2017-05-07 DIAGNOSIS — F331 Major depressive disorder, recurrent, moderate: Secondary | ICD-10-CM | POA: Insufficient documentation

## 2017-07-01 ENCOUNTER — Inpatient Hospital Stay
Admit: 2017-07-01 | Discharge: 2017-07-01 | Disposition: A | Discharge status: Home / Self Care | Attending: Emergency Medicine

## 2017-07-01 DIAGNOSIS — B353 Tinea pedis: Secondary | ICD-10-CM

## 2017-07-01 MED ORDER — TOLNAFTATE 1 % TOPICAL CREAM
1 % | Freq: Two times a day (BID) | CUTANEOUS | 0 refills | Status: DC
Start: 2017-07-01 — End: 2019-11-15

## 2017-07-01 MED ORDER — ACETAMINOPHEN 500 MG TAB
500 mg | ORAL_TABLET | Freq: Four times a day (QID) | ORAL | 0 refills | Status: AC | PRN
Start: 2017-07-01 — End: ?

## 2017-07-01 NOTE — ED Triage Notes (Signed)
Called patient to triage, patient in bathroom

## 2017-07-01 NOTE — ED Notes (Signed)
Patient is sleeping peacefully.

## 2017-07-01 NOTE — ED Provider Notes (Signed)
EMERGENCY DEPARTMENT HISTORY AND PHYSICAL EXAM    3:23 AM      Date: 07/01/2017  Patient Name: Julian [name]    History of Presenting Illness     Chief Complaint   Patient presents with   ??? Headache   ??? Itching         History Provided By: Patient    Chief Complaint: Headache  Duration:  One day  Timing:  N/A  Location: Frontal  Quality: N/A  Severity: Moderate  Modifying Factors: The patient has not been able to take any medication for his symptoms.  Associated Symptoms: Rash right foot. Denies congestion, sore throat, nausea, and vomiting.      Additional History (Context): Julian [name] is a 56 y.o. male who presents with a moderate frontal headache for one day. Denies any fall or trauma. The patient also reports pruritus of his right foot. Denies congestion, sore throat, nausea, and vomiting. The patient has not been able to take any medications for his symptoms. Patient is currently homeless.    PCP: Other, Phys, MD        Past History     Past Medical History:  Past Medical History:   Diagnosis Date   ??? Anxiety    ??? Chronic constipation    ??? Diabetes (HCC)    ??? Hypertension    ??? Major depression    ??? OCD (obsessive compulsive disorder)        Past Surgical History:  History reviewed. No pertinent surgical history.    Family History:  History reviewed. No pertinent family history.    Social History:  Social History     Tobacco Use   ??? Smoking status: Never Smoker   ??? Smokeless tobacco: Never Used   Substance Use Topics   ??? Alcohol use: No     Frequency: Never   ??? Drug use: No       Allergies:  Allergies   Allergen Reactions   ??? Ace Inhibitors Cough         Review of Systems     Review of Systems   HENT: Negative for congestion and sore throat.    Gastrointestinal: Negative for nausea and vomiting.   Skin: Positive for rash.   Neurological: Positive for headaches.   All other systems reviewed and are negative.             Visit Vitals  BP (!) 168/109   Pulse 78   Temp 97.5 ??F (36.4 ??C)   Resp 18   SpO2 100%            Physical Exam / Medical Decision Making   I am the first provider for this patient.    I reviewed the vital signs, available nursing notes, past medical history, past surgical history, family history and social history.    Vital Signs-Reviewed the patient's vital signs.    Physical exam:  General:  Well-developed, well-nourished, no apparent distress.    Head:  Normocephalic atraumatic.  Frontal sinuses tender to percussion minimally  Eyes:  Pupils midrange extraocular movements intact.  No pallor or conjunctival injection.    Nose:  No rhinorrhea, inspection grossly normal.    Ears:  Grossly normal to inspection, no discharge.    Mouth:  Mucous membranes moist, no appreciable intraoral lesion.    Neck/Back:  Trachea midline, no asymmetry.  No meningeal signs  Chest:  Grossly normal inspection, symmetric chest rise.    Pulmonary:  Clear to auscultation  bilaterally no wheezes rhonchi or rales.    Cardiovascular:  S1-S2 no murmurs rubs or gallops.    Abdomen: Soft, nontender, nondistended no guarding rebound or peritoneal signs.    Extremities:  Grossly normal to inspection, peripheral pulses intact    Neurologic:  Alert and oriented no appreciable focal neurologic deficit    Skin:  Warm and dry dry appearing skin over the bottom of his feet, no overlying erythema no pustular lesion  Psychiatric:  Grossly normal mood and affect  Nursing note reviewed, vital signs reviewed.    ED course:  Patient presents with 2 complaints, seems like his primary complaint is his RIGHT foot, seems to have dry skin versus tinea pedis, will treat empirically.  Has a 2nd complaint of a headache that started earlier today mild and over his sinuses, he is neurologically intact no clinical concern for meningitis intracranial hemorrhage as he is neurologically intact.    He was offered pain medication Tylenol accepted, given a prescription for tinea pedis as well as shelter information     Patient's presentation, history, physical exam and laboratory evaluations were reviewed.  At this time patient was felt to be stable for outpatient management and follow with primary care/specialist.  Patient was instructed to return to the emergency department with any concerns.    Disposition:    Discharged home      Portions of this chart were created with Dragon medical speech to text program.   Unrecognized errors may be present.            Diagnostic Study Results     Labs -  No results found for this or any previous visit (from the past 12 hour(s)).    Radiologic Studies -   No orders to display       Diagnosis     Clinical Impression:   1. Tinea pedis of right foot    2. Nonintractable headache, unspecified chronicity pattern, unspecified headache type    3. Elevated blood pressure reading          Follow-up Information     Follow up With Specialties Details Why Contact Info    [facility] Call in 2 days  [address]  [place] [id number]  [phone]    [facility] Emergency Medicine  As needed, If symptoms worsen [address]  [place] [id number]  [phone]              Medication List      START taking these medications    acetaminophen 500 mg tablet  Commonly known as:  TYLENOL  Take 1 Tab by mouth every six (6) hours as needed for Pain.     tolnaftate 1 % topical cream  Commonly known as:  TINACTIN  Apply  to affected area two (2) times a day.           Where to Get Your Medications      Information about where to get these medications is not yet available    Ask your nurse or doctor about these medications  ?? acetaminophen 500 mg tablet  ?? tolnaftate 1 % topical cream       _______________________________    Attestations:  Scribe Attestation     Sales executiveChristian Dean acting as a Neurosurgeonscribe for and in the presence of Despina HickSharkey, Halle Davlin M, MD      July 01, 2017 at 5:39 AM       Provider Attestation:  I personally performed the services described in the documentation, reviewed the documentation, as recorded by the scribe in my presence, and it accurately and completely records my words and actions. July 01, 2017 at 5:39 AM - Despina HickSharkey, Desta Bujak M, MD    _______________________________

## 2017-07-01 NOTE — ED Notes (Signed)
Patient states he believes his headache is related to his anxieties regarding his social security eligibility, and the fact that he is between homes right now. States no SI/HI, and no current depression but does state a hx of depression.

## 2017-07-01 NOTE — ED Triage Notes (Signed)
Patient comes in complaining of a headache for 2 days and itching to the right side of his foot, states that he can't take it anymore.

## 2017-07-01 NOTE — ED Notes (Signed)
Discharge information reviewed with patient, patient verbalized understanding. Paperwork signed.

## 2019-11-15 ENCOUNTER — Inpatient Hospital Stay
Admit: 2019-11-15 | Discharge: 2019-11-21 | Disposition: A | Discharge status: Home / Self Care | Payer: MEDICARE | Attending: Psychiatry | Admitting: Psychiatry

## 2019-11-15 DIAGNOSIS — F331 Major depressive disorder, recurrent, moderate: Secondary | ICD-10-CM

## 2019-11-15 LAB — CBC WITH AUTOMATED DIFF
ABS. BASOPHILS: 0.1 10*3/uL (ref 0.0–0.1)
ABS. EOSINOPHILS: 0.3 10*3/uL (ref 0.0–0.4)
ABS. IMM. GRANS.: 0 10*3/uL (ref 0.00–0.04)
ABS. LYMPHOCYTES: 2.1 10*3/uL (ref 0.8–3.5)
ABS. MONOCYTES: 0.7 10*3/uL (ref 0.0–1.0)
ABS. NEUTROPHILS: 4 10*3/uL (ref 1.8–8.0)
ABSOLUTE NRBC: 0 10*3/uL (ref 0.00–0.01)
BASOPHILS: 1 % (ref 0–1)
EOSINOPHILS: 5 % (ref 0–7)
HCT: 37 % (ref 36.6–50.3)
HGB: 12.5 g/dL (ref 12.1–17.0)
IMMATURE GRANULOCYTES: 1 % — ABNORMAL HIGH (ref 0.0–0.5)
LYMPHOCYTES: 29 % (ref 12–49)
MCH: 28.6 PG (ref 26.0–34.0)
MCHC: 33.8 g/dL (ref 30.0–36.5)
MCV: 84.7 FL (ref 80.0–99.0)
MONOCYTES: 9 % (ref 5–13)
MPV: 10.3 FL (ref 8.9–12.9)
NEUTROPHILS: 55 % (ref 32–75)
NRBC: 0 PER 100 WBC
PLATELET: 248 10*3/uL (ref 150–400)
RBC: 4.37 M/uL (ref 4.10–5.70)
RDW: 13.3 % (ref 11.5–14.5)
WBC: 7.2 10*3/uL (ref 4.1–11.1)

## 2019-11-15 LAB — METABOLIC PANEL, COMPREHENSIVE
A-G Ratio: 0.9 — ABNORMAL LOW (ref 1.1–2.2)
ALT (SGPT): 73 U/L (ref 12–78)
AST (SGOT): 50 U/L — ABNORMAL HIGH (ref 15–37)
Albumin: 3.5 g/dL (ref 3.5–5.0)
Alk. phosphatase: 71 U/L (ref 45–117)
Anion gap: 9 mmol/L (ref 5–15)
BUN/Creatinine ratio: 12 (ref 12–20)
BUN: 16 MG/DL (ref 6–20)
Bilirubin, total: 0.3 MG/DL (ref 0.2–1.0)
CO2: 29 mmol/L (ref 21–32)
Calcium: 9 MG/DL (ref 8.5–10.1)
Chloride: 103 mmol/L (ref 97–108)
Creatinine: 1.32 MG/DL — ABNORMAL HIGH (ref 0.70–1.30)
GFR est AA: >60 mL/min/{1.73_m2} (ref >60)
GFR est non-AA: 56 mL/min/{1.73_m2} — ABNORMAL LOW (ref >60)
Globulin: 3.7 g/dL (ref 2.0–4.0)
Glucose: 124 mg/dL — ABNORMAL HIGH (ref 65–100)
Potassium: 4.3 mmol/L (ref 3.5–5.1)
Protein, total: 7.2 g/dL (ref 6.4–8.2)
Sodium: 141 mmol/L (ref 136–145)

## 2019-11-15 LAB — DRUG SCREEN, URINE
AMPHETAMINES: NEGATIVE
Amphetamine Screen, Urine: NEGATIVE
BARBITURATES: NEGATIVE
BENZODIAZEPINES: NEGATIVE
Barbiturate Screen, Urine: NEGATIVE
Benzodiazepine Screen, Urine: NEGATIVE
COCAINE: NEGATIVE
Cocaine Screen Urine: NEGATIVE
METHADONE: NEGATIVE
Methadone Screen, Urine: NEGATIVE
OPIATES: NEGATIVE
Opiate Screen, Urine: NEGATIVE
PCP Screen, Urine: NEGATIVE
PCP(PHENCYCLIDINE): NEGATIVE
THC (TH-CANNABINOL): NEGATIVE
THC Screen, Urine: NEGATIVE

## 2019-11-15 LAB — URINALYSIS W/ REFLEX CULTURE
BACTERIA, URINE: NEGATIVE /hpf
Bacteria: NEGATIVE /hpf
Bilirubin, Urine: NEGATIVE
Bilirubin: NEGATIVE
Blood, Urine: NEGATIVE
Blood: NEGATIVE
Glucose, Ur: NEGATIVE mg/dL
Glucose: NEGATIVE mg/dL
Ketone: NEGATIVE mg/dL
Ketones, Urine: NEGATIVE mg/dL
Leukocyte Esterase, Urine: NEGATIVE
Leukocyte Esterase: NEGATIVE
Nitrite, Urine: NEGATIVE
Nitrites: NEGATIVE
Protein, UA: NEGATIVE mg/dL
Protein: NEGATIVE mg/dL
Specific Gravity, UA: 1.01 (ref 1.003–1.030)
Specific gravity: 1.01 (ref 1.003–1.030)
Urobilinogen, UA, POCT: 1 EU/dL (ref 0.2–1.0)
Urobilinogen: 1 EU/dL (ref 0.2–1.0)
pH (UA): 7 (ref 5.0–8.0)
pH, UA: 7 (ref 5.0–8.0)

## 2019-11-15 LAB — SARS-COV-2

## 2019-11-15 LAB — ETHYL ALCOHOL
ALCOHOL(ETHYL),SERUM: <10 MG/DL (ref <10)
Ethyl Alcohol: <10 MG/DL (ref <10)

## 2019-11-15 LAB — COVID-19 WITH INFLUENZA A/B
Influenza A By PCR: NOT DETECTED
Influenza A by PCR: NOT DETECTED
Influenza B By PCR: NOT DETECTED
Influenza B by PCR: NOT DETECTED
SARS-CoV-2 by PCR: NOT DETECTED
SARS-CoV-2: NOT DETECTED

## 2019-11-15 LAB — COMPREHENSIVE METABOLIC PANEL
ALT: 73 U/L (ref 12–78)
AST: 50 U/L — ABNORMAL HIGH (ref 15–37)
Albumin/Globulin Ratio: 0.9 — ABNORMAL LOW (ref 1.1–2.2)
Albumin: 3.5 g/dL (ref 3.5–5.0)
Alkaline Phosphatase: 71 U/L (ref 45–117)
Anion Gap: 9 mmol/L (ref 5–15)
BUN: 16 MG/DL (ref 6–20)
Bun/Cre Ratio: 12 (ref 12–20)
CO2: 29 mmol/L (ref 21–32)
Calcium: 9 MG/DL (ref 8.5–10.1)
Chloride: 103 mmol/L (ref 97–108)
Creatinine: 1.32 MG/DL — ABNORMAL HIGH (ref 0.70–1.30)
EGFR IF NonAfrican American: 56 mL/min/{1.73_m2} — ABNORMAL LOW (ref >60)
GFR African American: >60 mL/min/{1.73_m2} (ref >60)
Globulin: 3.7 g/dL (ref 2.0–4.0)
Glucose: 124 mg/dL — ABNORMAL HIGH (ref 65–100)
Potassium: 4.3 mmol/L (ref 3.5–5.1)
Sodium: 141 mmol/L (ref 136–145)
Total Bilirubin: 0.3 MG/DL (ref 0.2–1.0)
Total Protein: 7.2 g/dL (ref 6.4–8.2)

## 2019-11-15 LAB — CBC WITH AUTO DIFFERENTIAL
Basophils %: 1 % (ref 0–1)
Basophils Absolute: 0.1 10*3/uL (ref 0.0–0.1)
Eosinophils %: 5 % (ref 0–7)
Eosinophils Absolute: 0.3 10*3/uL (ref 0.0–0.4)
Granulocyte Absolute Count: 0 10*3/uL (ref 0.00–0.04)
Hematocrit: 37 % (ref 36.6–50.3)
Hemoglobin: 12.5 g/dL (ref 12.1–17.0)
Immature Granulocytes: 1 % — ABNORMAL HIGH (ref 0.0–0.5)
Lymphocytes %: 29 % (ref 12–49)
Lymphocytes Absolute: 2.1 10*3/uL (ref 0.8–3.5)
MCH: 28.6 PG (ref 26.0–34.0)
MCHC: 33.8 g/dL (ref 30.0–36.5)
MCV: 84.7 FL (ref 80.0–99.0)
MPV: 10.3 FL (ref 8.9–12.9)
Monocytes %: 9 % (ref 5–13)
Monocytes Absolute: 0.7 10*3/uL (ref 0.0–1.0)
NRBC Absolute: 0 10*3/uL (ref 0.00–0.01)
Neutrophils %: 55 % (ref 32–75)
Neutrophils Absolute: 4 10*3/uL (ref 1.8–8.0)
Nucleated RBCs: 0 PER 100 WBC
Platelets: 248 10*3/uL (ref 150–400)
RBC: 4.37 M/uL (ref 4.10–5.70)
RDW: 13.3 % (ref 11.5–14.5)
WBC: 7.2 10*3/uL (ref 4.1–11.1)

## 2019-11-15 LAB — COVID-19

## 2019-11-15 MED ORDER — PREGABALIN 75 MG CAP
75 mg | Freq: Every day | ORAL | Status: DC
Start: 2019-11-15 — End: 2019-11-15

## 2019-11-15 MED ORDER — MAGNESIUM HYDROXIDE 400 MG/5 ML ORAL SUSP
400 mg/5 mL | Freq: Every day | ORAL | Status: DC | PRN
Start: 2019-11-15 — End: 2019-11-21

## 2019-11-15 MED ORDER — HYDROXYZINE 25 MG TAB
25 mg | Freq: Three times a day (TID) | ORAL | Status: DC | PRN
Start: 2019-11-15 — End: 2019-11-21
  Administered 2019-11-20 – 2019-11-21 (×2): via ORAL

## 2019-11-15 MED ORDER — ATORVASTATIN 40 MG TAB
40 mg | Freq: Every evening | ORAL | Status: DC
Start: 2019-11-15 — End: 2019-11-21
  Administered 2019-11-16 – 2019-11-21 (×6): via ORAL

## 2019-11-15 MED ORDER — BUPROPION XL 150 MG 24 HR TAB
150 mg | ORAL | Status: DC
Start: 2019-11-15 — End: 2019-11-15

## 2019-11-15 MED ORDER — HYDRALAZINE 50 MG TAB
50 mg | Freq: Two times a day (BID) | ORAL | Status: DC
Start: 2019-11-15 — End: 2019-11-21
  Administered 2019-11-15 – 2019-11-21 (×12): via ORAL

## 2019-11-15 MED ORDER — DIPHENHYDRAMINE HCL 50 MG/ML IJ SOLN
50 mg/mL | Freq: Two times a day (BID) | INTRAMUSCULAR | Status: DC | PRN
Start: 2019-11-15 — End: 2019-11-21

## 2019-11-15 MED ORDER — LOSARTAN 25 MG TAB
25 mg | Freq: Once | ORAL | Status: DC
Start: 2019-11-15 — End: 2019-11-15

## 2019-11-15 MED ORDER — BENZTROPINE 1 MG TAB
1 mg | Freq: Two times a day (BID) | ORAL | Status: DC | PRN
Start: 2019-11-15 — End: 2019-11-21

## 2019-11-15 MED ORDER — TRAZODONE 50 MG TAB
50 mg | Freq: Every evening | ORAL | Status: DC | PRN
Start: 2019-11-15 — End: 2019-11-21
  Administered 2019-11-17: 01:00:00 via ORAL

## 2019-11-15 MED ORDER — .PHARMACY TO SUBSTITUTE PER PROTOCOL
Status: DC | PRN
Start: 2019-11-15 — End: 2019-11-15

## 2019-11-15 MED ORDER — BUPROPION XL 150 MG 24 HR TAB
150 mg | Freq: Every evening | ORAL | Status: DC
Start: 2019-11-15 — End: 2019-11-21
  Administered 2019-11-16 – 2019-11-21 (×6): via ORAL

## 2019-11-15 MED ORDER — ACETAMINOPHEN 325 MG TABLET
325 mg | ORAL | Status: DC | PRN
Start: 2019-11-15 — End: 2019-11-21

## 2019-11-15 MED ORDER — NAPROXEN 250 MG TAB
250 mg | Freq: Three times a day (TID) | ORAL | Status: DC | PRN
Start: 2019-11-15 — End: 2019-11-21

## 2019-11-15 MED ORDER — HYDROCHLOROTHIAZIDE 25 MG TAB
25 mg | Freq: Every day | ORAL | Status: DC
Start: 2019-11-15 — End: 2019-11-19
  Administered 2019-11-16 – 2019-11-19 (×5): via ORAL

## 2019-11-15 MED ORDER — HALOPERIDOL LACTATE 5 MG/ML IJ SOLN
5 mg/mL | Freq: Four times a day (QID) | INTRAMUSCULAR | Status: DC | PRN
Start: 2019-11-15 — End: 2019-11-21

## 2019-11-15 MED ORDER — LOSARTAN 50 MG TAB
50 mg | Freq: Every day | ORAL | Status: DC
Start: 2019-11-15 — End: 2019-11-15
  Administered 2019-11-15: 13:00:00 via ORAL

## 2019-11-15 MED ORDER — OLANZAPINE 5 MG TAB
5 mg | Freq: Four times a day (QID) | ORAL | Status: DC | PRN
Start: 2019-11-15 — End: 2019-11-21

## 2019-11-15 MED ORDER — ACETAMINOPHEN 325 MG TABLET
325 mg | ORAL | Status: DC | PRN
Start: 2019-11-15 — End: 2019-11-15

## 2019-11-15 MED ORDER — POLYETHYLENE GLYCOL 3350 17 GRAM (100 %) ORAL POWDER PACKET
17 gram | Freq: Every day | ORAL | Status: DC
Start: 2019-11-15 — End: 2019-11-21
  Administered 2019-11-16 – 2019-11-20 (×5): via ORAL

## 2019-11-15 MED ORDER — LORAZEPAM 2 MG/ML IJ SOLN
2 mg/mL | INTRAMUSCULAR | Status: DC | PRN
Start: 2019-11-15 — End: 2019-11-21

## 2019-11-15 MED FILL — HYDRALAZINE 50 MG TAB: 50 mg | ORAL | Qty: 1

## 2019-11-15 MED FILL — LOSARTAN 50 MG TAB: 50 mg | ORAL | Qty: 2

## 2019-11-15 NOTE — Progress Notes (Addendum)
[facility] Admission Pharmacy Medication Reconciliation    Information obtained from:  ??? Patient's prescription bottles mostly from [facility] [place], [place] [phone]  RxQuery data available1: none    Comments/recommendations:    1) This pharmacist attempted phone interview with patient, but patient did not stay on phone to answer any questions. Patient interview NOT completed. PTA list below based on patient's prescription bottles mostly from [facility], [place] [phone] and OTC products brought in by patient and bagged/logged into inpatient pharmacy.    2) Medication changes to PTA list:    Added  ??? From patients medications bottles, many OTC medications/supplements were added:  o hydrocortisone (CORTAID) 1 % topical cream o Apply  to affected area two (2) times daily as needed for Skin Irritation. use thin layer   Indications: inflammation of the skin due to an allergy o   lidocaine (XYLOCAINE) 4 % topical cream o Apply  to affected area two (2) times daily as needed for Pain. Indications: skin irritation o   green tea leaf extract (GREEN TEA PO) o Take 500 mg by mouth. Indications: herbal supplement o   B-complex with vitamin C (VITAMIN B COMPLEX-C PO) o Take 1 Tab by mouth. Indications: vitamin B deficiency o   Panax ginseng root (GINSENG KOREAN PO) o Take 100 mg by mouth. o   eucalypt/men/camph/turp/wh.pet (MEDICATED CHEST RUB EX) o by Apply Externally route as needed for Cough. o   diphenhydrAMINE-zinc acetate 2%-0.1% (BENADRYL) 2-0.1 % topical cream o Apply  to affected area two (2) times daily as needed for Itching. o   guaiFENesin (ORGANIDIN) 400 mg tablet o Take  by mouth every four (4) hours as needed for Congestion. o   ferrous sulfate 325 mg (65 mg iron) tablet o Take  by mouth Daily (before breakfast). Indications: anemia from inadequate iron o   miconazole nitrate 2 % spray o by Apply Externally route as needed (athlete's foot). Indications: athlete's foot o   ginkgo biloba  120 mg tab o Take 1 Tab by mouth. Indications: herbal supplement o   trolamine salicylate (Arthricream) 10 % topical cream o Apply  to affected area as needed for Stiffness. o   methyl salicylate/menthol (BEN GAY EX) o by Apply Externally route as needed.     Removed  ??? none  Adjusted  ??? Losartan/HCTZ 100 mg/12.5 mg po daily changed to 100 mg/25 mg po daily    3) The [place] Prescription Monitoring Program (PMP) was accessed to determine fill history of any controlled medications:  ??? 11/13/19: Pregabalin 50 mg po BID #60 (30-day supply) Note: patient takes differently: 100 mg po daily.        1RxQuery pharmacy benefit data reflects medications filled and processed through the patient's insurance, however                this data does NOT capture whether the medication was picked up or is currently being taken by the patient.       Past Medical History/Disease States:  Past Medical History:   Diagnosis Date   ??? Anxiety    ??? Chronic constipation    ??? Diabetes (HCC)    ??? Hypertension    ??? Major depression    ??? OCD (obsessive compulsive disorder)          Patient allergies:   Allergies as of 11/15/2019 - Review Complete 11/15/2019   Allergen Reaction Noted   ??? Ace inhibitors Cough 07/01/2017  Prior to Admission Medications   Prescriptions Last Dose Informant  Taking?   B-complex with vitamin C (VITAMIN B COMPLEX-C PO)    Yes   Sig: Take 1 Tab by mouth. Indications: vitamin B deficiency   Panax ginseng root (GINSENG KOREAN PO)    Yes   Sig: Take 100 mg by mouth.   acetaminophen (TYLENOL) 500 mg tablet    Yes   Sig: Take 1 Tab by mouth every six (6) hours as needed for Pain.   Patient taking differently: Take 500 mg by mouth every six (6) hours as needed for Pain. Indications: pain   atorvastatin (LIPITOR) 40 mg tablet    Yes   Sig: Take 40 mg by mouth nightly. Indications: excessive fat in the blood   buPROPion XL (WELLBUTRIN XL) 150 mg tablet 11/08/2019 at Unknown time   Yes   Sig: Take 150 mg by mouth daily.  Indications: major depressive disorder, Patient reports last dose x 4 days ago.   diphenhydrAMINE-zinc acetate 2%-0.1% (BENADRYL) 2-0.1 % topical cream    Yes   Sig: Apply  to affected area two (2) times daily as needed for Itching.   eucalypt/men/camph/turp/wh.pet (MEDICATED CHEST RUB EX)    Yes   Sig: by Apply Externally route as needed for Cough.   ferrous sulfate 325 mg (65 mg iron) tablet    Yes   Sig: Take  by mouth Daily (before breakfast). Indications: anemia from inadequate iron   ginkgo biloba 120 mg tab    Yes   Sig: Take 1 Tab by mouth. Indications: herbal supplement   green tea leaf extract (GREEN TEA PO)    Yes   Sig: Take 500 mg by mouth. Indications: herbal supplement   guaiFENesin (ORGANIDIN) 400 mg tablet    Yes   Sig: Take  by mouth every four (4) hours as needed for Congestion.   hydrALAZINE (APRESOLINE) 50 mg tablet 10/15/2019 at Unknown time   Yes   Sig: Take 50 mg by mouth two (2) times a day. Indications: high blood pressure, Patient reports last dose x 2 weeks ago.   hydrOXYzine HCL (ATARAX) 50 mg tablet 11/08/2019 at Unknown time   Yes   Sig: Take 50 mg by mouth two (2) times a day. Indications: anxious, Patient reports last dose x 4 days ago   hydrocortisone (CORTAID) 1 % topical cream    Yes   Sig: Apply  to affected area two (2) times daily as needed for Skin Irritation. use thin layer   Indications: inflammation of the skin due to an allergy   lidocaine (XYLOCAINE) 4 % topical cream    Yes   Sig: Apply  to affected area two (2) times daily as needed for Pain. Indications: skin irritation   losartan-hydroCHLOROthiazide (HYZAAR) 100-25 mg per tablet 10/15/2019 at Unknown time   Yes   Sig: Take 1 Tab by mouth daily. Indications: high blood pressure, Patient reports last dose x 2 weeks ago   methyl salicylate/menthol (BEN GAY EX)    Yes   Sig: by Apply Externally route as needed.   miconazole nitrate 2 % aerp    Yes   Sig: by Apply Externally route as needed (athlete's foot). Indications:  athlete's foot   naproxen (NAPROSYN) 500 mg tablet 10/15/2019 at Unknown time   Yes   Sig: Take 500 mg by mouth two (2) times daily as needed for Pain. Indications: pain   polyethylene glycol (Miralax) 17 gram/dose powder 11/15/2019 at Unknown time  Yes   Sig: Take 17 g by mouth daily. Indications: constipation   pregabalin (LYRICA) 50 mg capsule 10/15/2019 at Unknown time   Yes   Sig: Take 50 mg by mouth two (2) times a day. Indications: neuropathic pain   trolamine salicylate (Arthricream) 10 % topical cream    Yes   Sig: Apply  to affected area as needed for Stiffness.            Thank you,  Corazon G Goss, RPH

## 2019-11-15 NOTE — Other (Addendum)
Comprehensive Assessment Form Part 1      Section I - Disposition    Axis I - Major Depressive Disorder un specified          The Medical Doctor to Psychiatrist conference was not completed.  The Medical Doctor is in agreement with Psychiatrist disposition because of (reason) patient is a voluntary admission.  The plan is admit to behavioral health.  The on-call Psychiatrist consulted was Dr. Charlcie Cradle.  The admitting Psychiatrist will be Dr. Charlcie Cradle.  The admitting Diagnosis is Major Depressive Disorder.  The Payor source is [employer].  The name of the representative was .  This was .     Section II - Integrated Summary  Summary:  Patient is 59 year old male reporting to ED Patient presents to ED voluntarily via EMS c/o SI with a plan to run into traffic. Patient reports he stopped taking his Wellbutrin and Hydroxyzine x 4 days ago because he stated "I felt depressed and didn't want to live anymore, I still don't want to live anymore. I hate my family, they make me feel like an outkast and look at me crazy. They have no compassion." Patient denies HI/hallucinations. Patient hypertensive enroute with EMS and on arrival, patient reports last dose of HTN medications was x 2 weeks ago. Patient calm, cooperative, and pleasant. At bedside, patient reported he has been suicidal over the past week. Patient reported current suicidal thoughts, denied homicidal thoughts and hallucinations. Patient verbalized plan to jump into traffic as reported he came close to it tonight. Patient reported he cut himself in the past as a suicidal attempt as reported a long time ago.  Patient reported stressors being he does not have any family support, as reported he has been angry and depressed about this and stopped taking his medications about a week ago. Patient could not identify who prescribes his medications. Patient is currently homeless but stated he stays with friends and different places.  Patient receives disability income. Patient denied substance abuse. Patient reported over past week he has not been sleeping well and his eating has been off/ on.   The patienthas demonstrated mental capacity to provide informed consent.  The information is given by the patient.  The Chief Complaint is suicidal.  The Precipitant Factors are lack of family support.  Previous Hospitalizations: yes  The patient has not previously been in restraints.  Current Psychiatrist and/or Case Manager is none reported.    Lethality Assessment:    The potential for suicide noted by the following: ideation, plan to jump into traffic .  The potential for homicide is not noted.  The patient has not been a perpetrator of sexual or physical abuse.  There are not pending charges.  The patient is not felt to be at risk for self harm or harm to others.  The attending nurse was advised contracts for safety.    Section III - Psychosocial  The patient's overall mood and attitude is low mood, calm and cooperative.  Feelings of helplessness and hopelessness are not observed.  Generalized anxiety is not observed.  Panic is not observed. Phobias are not observed.  Obsessive compulsive tendencies are not observed.      Section IV - Mental Status Exam  The patient's appearance shows no evidence of impairment.  The patient's behavior shows no evidence of impairment. The patient is oriented to time, place, person and situation.  The patient's speech shows no evidence of impairment.  The patient's mood is depressed.  The range of affect shows no evidence of impairment.  The patient's thought content demonstrates no evidence of impairment.  The thought process shows no evidence of impairment.  The patient's perception shows no evidence of impairment. The patient's memory shows no evidence of impairment.  The patient's appetite shows no evidence of impairment.  The patient's sleep shows no evidence of impairment. The patient's insight shows no evidence  of impairment.  The patient's judgement shows no evidence of impairment.                  Section V - Substance Abuse  The patient is not using substances.  The patient is using none reported. The patient has experienced the following withdrawal symptoms: N/A.      Section VI - Living Arrangements  The patient is single.  The patient lives alone. The patient has no children.  The patient does plan to return home upon discharge.  The patient does not have legal issues pending. The patient's source of income comes from disability.  Religious and cultural practices have not been voiced at this time.    The patient's greatest support comes from no one reported and this person will not be involved with the treatment.    The patient has not been in an event described as horrible or outside the realm of ordinary life experience either currently or in the past.  The patient has not been a victim of sexual/physical abuse.    Section VII - Other Areas of Clinical Concern  The highest grade achieved is 12th with the overall quality of school experience being described as not assessed.  The patient is currently unemployed and speaks [place] as a primary language.  The patient has no communication impairments affecting communication. The patient's preference for learning can be described as: can read and write adequately.  The patient's hearing is normal.  The patient's vision is normal.      [employer]

## 2019-11-15 NOTE — ED Notes (Signed)
Patient changed into green gown and paper scrub pants. 3 patient bags, 1 large luggage bag, and a small luggage bag secured. Excess linen/bags/cords removed from patient's room.

## 2019-11-15 NOTE — ED Provider Notes (Signed)
EMERGENCY DEPARTMENT HISTORY AND PHYSICAL EXAM    Please note that this dictation was completed with Dragon, the computer voice recognition software. Quite often unanticipated grammatical, syntax, homophones, and other interpretive errors are inadvertently transcribed by the computer software. Please disregard these errors.  Please excuse any errors that have escaped final proofreading.    Date: 11/15/2019  Patient Name: Julian [name]  Patient Age and Sex: 59 y.o. male    History of Presenting Illness     Chief Complaint   Patient presents with   ??? Mental Health Problem       History Provided By: Patient    HPI: Julian [name], is a 59 y.o. male whose medical history is noted below and includes depression, anxiety, HTN, HLD, presents to the ED with depression, suicidal ideations and with a plan to step into traffic.  The patient denies having any medical complaints, also denies any recent history of injuries and denies having pain at this time.     He is on medication for high blood pressure as well as high cholesterol, reports that he is compliant with both medications but has always had trouble managing his blood pressure.  He states that his blood pressure is particularly high whenever he is upset, such as right now. Normally it is around 833A-250N systolic.    In addition to his blood pressure and cholesterol medications, the patient is also taking 2 psychiatric medications, he states both of them are for depression.  1 is bupropion, the second he cannot recall the name but believes is Lyrica.  He stopped taking both of these medications approximately 2 weeks ago because "what is the point".  When asked to clarify, patient states that the medications are not helping him and he wants to end his life anyway.    The trigger for his suicidality and worsening depression is the holidays.  He states that the majority of his family lives in [place] and he moved to [place] approximately 2 years ago.  He does not  believe that his family cares for him. he recently tried to call them to see how everyone is doing given that easter is coming up. He found that his family responded Coley to him and seemed uncaring.    He specifically denies having any recent fevers, chills, abd pain, n/v, changes in appetite, chest pain, palpitations, cough shortness of breath, lower extremity edema, orthopnea, PND or changes in urine output.  No taste/smell sensory loss. No known covid 19 exposures.    Pt denies any other alleviating or exacerbating factors. No other associated signs or symptoms. There are no other complaints, changes or physical findings at this time.     PCP: Other, Phys, MD    Past History   All documented elements of the [place] reviewed and verified by me. -Charolotte Capuchin, MD    Past Medical History:  Past Medical History:   Diagnosis Date   ??? Anxiety    ??? Chronic constipation    ??? Diabetes ([place])    ??? Hypertension    ??? Major depression    ??? OCD (obsessive compulsive disorder)        Past Surgical History:  History reviewed. No pertinent surgical history.    Family History:  History reviewed. No pertinent family history.    Social History:  Social History     Tobacco Use   ??? Smoking status: Never Smoker   ??? Smokeless tobacco: Never Used   Substance Use Topics   ???  Alcohol use: No     Frequency: Never   ??? Drug use: No       Allergies:  Allergies   Allergen Reactions   ??? Ace Inhibitors Cough       Review of Systems   All other systems reviewed and negative    Review of Systems   Constitutional: Negative for appetite change and fever.   HENT: Negative.    Eyes: Negative.    Respiratory: Negative for cough and shortness of breath.    Cardiovascular: Negative for chest pain and palpitations.   Gastrointestinal: Negative for abdominal pain, nausea and vomiting.   Endocrine: Negative.    Genitourinary: Negative for dysuria, flank pain and hematuria.   Musculoskeletal: Negative for back pain and joint swelling.   Skin: Negative.  Negative  for rash.   Neurological: Negative for dizziness, weakness, light-headedness, numbness and headaches.   Hematological: Negative for adenopathy.   Psychiatric/Behavioral: Positive for sleep disturbance and suicidal ideas. Negative for confusion, hallucinations and self-injury.   All other systems reviewed and are negative.      Physical Exam   Reviewed patients vital signs and nursing note    Physical Exam  Vitals signs and nursing note reviewed.   HENT:      Head: Atraumatic.      Mouth/Throat:      Mouth: Mucous membranes are moist.   Eyes:      General: No scleral icterus.     Extraocular Movements: Extraocular movements intact.      Conjunctiva/sclera: Conjunctivae normal.      Pupils: Pupils are equal, round, and reactive to light.   Neck:      Musculoskeletal: Normal range of motion and neck supple.   Cardiovascular:      Rate and Rhythm: Normal rate and regular rhythm.      Pulses: Normal pulses.      Heart sounds: Normal heart sounds.   Pulmonary:      Effort: Pulmonary effort is normal.      Breath sounds: Normal breath sounds.   Abdominal:      Palpations: Abdomen is soft.      Tenderness: There is no abdominal tenderness.   Musculoskeletal: Normal range of motion.   Skin:     General: Skin is warm and dry.      Capillary Refill: Capillary refill takes less than 2 seconds.   Neurological:      General: No focal deficit present.      Mental Status: He is alert.   Psychiatric:         Attention and Perception: Attention normal. He does not perceive auditory or visual hallucinations.         Mood and Affect: Affect normal. Mood is depressed.         Speech: Speech normal.         Behavior: Behavior is cooperative.         Thought Content: Thought content includes suicidal ideation. Thought content does not include homicidal ideation. Thought content includes suicidal plan. Thought content does not include homicidal plan.         Diagnostic Study Results     Labs - I have personally reviewed and interpreted all  laboratory results. Charolotte Capuchin, MD, MSc  Recent Results (from the past 56 hour(s))   DRUG SCREEN, URINE    Collection Time: 11/15/19  2:14 AM   Result Value Ref Range    AMPHETAMINES Negative NEG      BARBITURATES  Negative NEG      BENZODIAZEPINES Negative NEG      COCAINE Negative NEG      METHADONE Negative NEG      OPIATES Negative NEG      PCP(PHENCYCLIDINE) Negative NEG      THC (TH-CANNABINOL) Negative NEG      Drug screen comment (NOTE)    URINALYSIS W/ REFLEX CULTURE    Collection Time: 11/15/19  2:14 AM    Specimen: Urine   Result Value Ref Range    Color YELLOW/STRAW      Appearance CLEAR CLEAR      Specific gravity 1.010 1.003 - 1.030      pH (UA) 7.0 5.0 - 8.0      Protein Negative NEG mg/dL    Glucose Negative NEG mg/dL    Ketone Negative NEG mg/dL    Bilirubin Negative NEG      Blood Negative NEG      Urobilinogen 1.0 0.2 - 1.0 EU/dL    Nitrites Negative NEG      Leukocyte Esterase Negative NEG      WBC 0-4 0 - 4 /hpf    RBC 0-5 0 - 5 /hpf    Epithelial cells FEW FEW /lpf    Bacteria Negative NEG /hpf    UA:UC IF INDICATED CULTURE NOT INDICATED BY UA RESULT CNI     ETHYL ALCOHOL    Collection Time: 11/15/19  2:14 AM   Result Value Ref Range    ALCOHOL(ETHYL),SERUM <10 <10 MG/DL   CBC WITH AUTOMATED DIFF    Collection Time: 11/15/19  2:14 AM   Result Value Ref Range    WBC 7.2 4.1 - 11.1 K/uL    RBC 4.37 4.10 - 5.70 M/uL    HGB 12.5 12.1 - 17.0 g/dL    HCT 37.0 36.6 - 50.3 %    MCV 84.7 80.0 - 99.0 FL    MCH 28.6 26.0 - 34.0 PG    MCHC 33.8 30.0 - 36.5 g/dL    RDW 13.3 11.5 - 14.5 %    PLATELET 248 150 - 400 K/uL    MPV 10.3 8.9 - 12.9 FL    NRBC 0.0 0 PER 100 WBC    ABSOLUTE NRBC 0.00 0.00 - 0.01 K/uL    NEUTROPHILS 55 32 - 75 %    LYMPHOCYTES 29 12 - 49 %    MONOCYTES 9 5 - 13 %    EOSINOPHILS 5 0 - 7 %    BASOPHILS 1 0 - 1 %    IMMATURE GRANULOCYTES 1 (H) 0.0 - 0.5 %    ABS. NEUTROPHILS 4.0 1.8 - 8.0 K/UL    ABS. LYMPHOCYTES 2.1 0.8 - 3.5 K/UL    ABS. MONOCYTES 0.7 0.0 - 1.0 K/UL    ABS. EOSINOPHILS  0.3 0.0 - 0.4 K/UL    ABS. BASOPHILS 0.1 0.0 - 0.1 K/UL    ABS. IMM. GRANS. 0.0 0.00 - 0.04 K/UL    DF AUTOMATED     METABOLIC PANEL, COMPREHENSIVE    Collection Time: 11/15/19  2:14 AM   Result Value Ref Range    Sodium 141 136 - 145 mmol/L    Potassium 4.3 3.5 - 5.1 mmol/L    Chloride 103 97 - 108 mmol/L    CO2 29 21 - 32 mmol/L    Anion gap 9 5 - 15 mmol/L    Glucose 124 (H) 65 - 100 mg/dL    BUN 16 6 - 20  MG/DL    Creatinine 1.32 (H) 0.70 - 1.30 MG/DL    BUN/Creatinine ratio 12 12 - 20      GFR est AA >60 >60 ml/min/1.57m    GFR est non-AA 56 (L) >60 ml/min/1.766m   Calcium 9.0 8.5 - 10.1 MG/DL    Bilirubin, total 0.3 0.2 - 1.0 MG/DL    ALT (SGPT) 73 12 - 78 U/L    AST (SGOT) 50 (H) 15 - 37 U/L    Alk. phosphatase 71 45 - 117 U/L    Protein, total 7.2 6.4 - 8.2 g/dL    Albumin 3.5 3.5 - 5.0 g/dL    Globulin 3.7 2.0 - 4.0 g/dL    A-G Ratio 0.9 (L) 1.1 - 2.2     SARS-COV-2    Collection Time: 11/15/19  2:14 AM   Result Value Ref Range    SARS-CoV-2 Please find results under separate order     COVID-19 WITH INFLUENZA A/B    Collection Time: 11/15/19  2:14 AM   Result Value Ref Range    SARS-CoV-2 Not detected NOTD      Influenza A by PCR Not detected NOTD      Influenza B by PCR Not detected NOTD         Radiologic Studies - I have personally reviewed and interpreted all imaging studies and agree with radiology interpretation and report. - TaCharolotte CapuchinMD, MSc  No orders to display         Medical Decision Making   I am the first provider for this patient.    Records Reviewed: I reviewed our electronic medical record system for any past medical records that were available that may contribute to the patient's current condition, including their PMH, surgical history, social and family history. Reviewed the nursing notes and vital signs from today's visit. Nursing notes will be reviewed as they become available in realtime while the pt has been in the ED. In addition, I read most recent discharge summaries, if  available and reviewed prior ECGs or imaging studies for comparison purposes.  TaCharolotte CapuchinMD Msc    Vital Signs-Reviewed the patient's vital signs.  Patient Vitals for the past 24 hrs:   Temp Pulse Resp BP SpO2   11/15/19 0306 97.5 ??F (36.4 ??C) 77 ??? (!) 148/89 99 %   11/15/19 0224 ??? ??? ??? (!) 168/109 ???   11/15/19 0223 ??? ??? ??? (!) 168/109 ???   11/15/19 0030 98 ??F (36.7 ??C) 84 18 (!) 181/111 100 %       Provider Notes (Medical Decision Making):   This patient presents with symptoms consistent with an underlying psychiatric disorder, specifically depression that has worsened after he reached out to his family. He is now suicidal with plan to jump in front of traffic.  Patient appears well groomed. He is however homeless, states that he stays "here and there and wherever I can".  Homelessness is another trigger for him, particularly during the holidays.  He does not likely seem intoxicated also denies any drug or alcohol use.    Presentation not consistent with acute organic causes to include delirium, dementia or drug induced disorders (acute ingestions or withdrawal; no evidence of toxidrome). Given the H&P, I suspect this patient is suicidal and as a result potentially seriously disabled and will require psychiatric care. Will consult [facility] crisis to evaluate the patient for potential admission vs advise on further management and outpatient follow up if discharged.  3:12 AM  Patient is medically cleared.  [facility] has evaluated him and they plan to admit.    Of note in regard to the patient's hypertension: He confirms that he has been compliant with his blood pressure medications and it is only the psychiatric medications that he stopped 2 weeks ago.  His initial blood pressure was elevated and I suspect the reason for it was, as the patient stated, his mental state when he initially arrived as he was quite agitated while recounting his family's response to his attempt to reach out to them.  He has since calmed  down significantly and his blood pressure has come down to baseline levels for the patient.  Last measurement by me personally using a manual cuff at bedside showed a blood pressure of 146/88.    I do not recommend lowering his blood pressure any further very quickly as this alone can have very harmful outcomes on the patient including hypoperfusion of vital organs.  Rather, high blood pressure if it is normalized should be done so in a gradual fashion, not emergently.  I recommend that he continue taking his blood pressure medications as he is prescribed right now, the next dose would be in the morning.  Once blood pressures are known with him being calm and on his medications, further adjustment of dosing can be gradually made, if needed.      ED Course:   Initial assessment performed. The patients presenting problems have been discussed, and they are in agreement with the care plan formulated and outlined with them.  I have encouraged them to ask questions as they arise throughout their visit.      Consult Note:  Charolotte Capuchin, MD spoke with  [facility],   Discussed pt's hx, physical exam and available diagnostic and imaging results. Reviewed care plans. Agree with management and plan thus far.     DISPOSITION: ADMIT TO PSYCH  Patient is being admitted to the hospital.  Their test results and reasons for admission have been discussed. The patient and/or available family express agreement with and understanding of the need to be admitted and their admission diagnosis.     Thank you for resuming the care of this patient.  Please don't hesitate to contact me in the emergency department if you  have any additional questions.    Charolotte Capuchin, MD, MSc      I, Raelyn Mora, MD, am the attending of record for this patient encounter.    Diagnosis     Clinical Impression:   1. Suicidal ideations    2. Severe episode of recurrent major depressive disorder, without psychotic features ([place])    3. Essential hypertension         Attestation:  I personally performed the services described in this documentation on this date 11/15/2019 for patient Julian [name].  Charolotte Capuchin, MD

## 2019-11-15 NOTE — Progress Notes (Signed)
Problem: Discharge Planning  Goal: *Discharge to safe environment  Outcome: Not Progressing Towards Goal  Note: Patient is homeless. Patient does not identify an alternative discharge option.  Goal: *Knowledge of medication management  Outcome: Progressing Towards Goal  Note: Patient verbalizes understanding of medication regimen. Patient is taking medications as prescribed.  Goal: *Knowledge of discharge instructions  Outcome: Progressing Towards Goal  Note: Patient verbalizes goals for treatment and safe discharge.

## 2019-11-15 NOTE — Other (Incomplete Revision)
Comprehensive Assessment Form Part 1      Section I - Disposition    Axis I - Major Depressive Disorder un specified          The Medical Doctor to Psychiatrist conference was not completed.  The Medical Doctor is in agreement with Psychiatrist disposition because of (reason) patient is a voluntary admission.  The plan is admit to behavioral health. [facility] 325-1.   The on-call Psychiatrist consulted was Alden Server, NP  The admitting Psychiatrist will be Dr. Karie Mainland  The admitting Diagnosis is Major Depressive Disorder.  The Payor source is [employer].  The name of the representative was .  This was .     Section II - Integrated Summary  Summary:  Patient is 59 year old male reporting to ED Patient presents to ED voluntarily via EMS c/o SI with a plan to run into traffic. Patient reports he stopped taking his Wellbutrin and Hydroxyzine x 4 days ago because he stated "I felt depressed and didn't want to live anymore, I still don't want to live anymore. I hate my family, they make me feel like an outkast and look at me crazy. They have no compassion." Patient denies HI/hallucinations. Patient hypertensive enroute with EMS and on arrival, patient reports last dose of HTN medications was x 2 weeks ago. Patient calm, cooperative, and pleasant. At bedside, patient reported he has been suicidal over the past week. Patient reported current suicidal thoughts, denied homicidal thoughts and hallucinations. Patient verbalized plan to jump into traffic as reported he came close to it tonight. Patient reported he cut himself in the past as a suicidal attempt as reported a long time ago.  Patient reported stressors being he does not have any family support, as reported he has been angry and depressed about this and stopped taking his medications about a week ago. Patient could not identify who prescribes his medications. Patient is currently homeless but stated he stays with friends and  different places. Patient receives disability income. Patient denied substance abuse. Patient reported over past week he has not been sleeping well and his eating has been off/ on.   The patienthas demonstrated mental capacity to provide informed consent.  The information is given by the patient.  The Chief Complaint is suicidal.  The Precipitant Factors are lack of family support.  Previous Hospitalizations: yes  The patient has not previously been in restraints.  Current Psychiatrist and/or Case Manager is none reported.    Lethality Assessment:    The potential for suicide noted by the following: ideation, plan to jump into traffic .  The potential for homicide is not noted.  The patient has not been a perpetrator of sexual or physical abuse.  There are not pending charges.  The patient is not felt to be at risk for self harm or harm to others.  The attending nurse was advised contracts for safety.    Section III - Psychosocial  The patient's overall mood and attitude is low mood, calm and cooperative.  Feelings of helplessness and hopelessness are not observed.  Generalized anxiety is not observed.  Panic is not observed. Phobias are not observed.  Obsessive compulsive tendencies are not observed.      Section IV - Mental Status Exam  The patient's appearance shows no evidence of impairment.  The patient's behavior shows no evidence of impairment. The patient is oriented to time, place, person and situation.  The patient's speech shows no evidence  of impairment.  The patient's mood is depressed.  The range of affect shows no evidence of impairment.  The patient's thought content demonstrates no evidence of impairment.  The thought process shows no evidence of impairment.  The patient's perception shows no evidence of impairment. The patient's memory shows no evidence of impairment.  The patient's appetite shows no evidence of impairment.  The patient's sleep shows no evidence of impairment. The patient's insight  shows no evidence of impairment.  The patient's judgement shows no evidence of impairment.                  Section V - Substance Abuse  The patient is not using substances.  The patient is using none reported. The patient has experienced the following withdrawal symptoms: N/A.      Section VI - Living Arrangements  The patient is single.  The patient lives alone. The patient has no children.  The patient does plan to return home upon discharge.  The patient does not have legal issues pending. The patient's source of income comes from disability.  Religious and cultural practices have not been voiced at this time.    The patient's greatest support comes from no one reported and this person will not be involved with the treatment.    The patient has not been in an event described as horrible or outside the realm of ordinary life experience either currently or in the past.  The patient has not been a victim of sexual/physical abuse.    Section VII - Other Areas of Clinical Concern  The highest grade achieved is 12th with the overall quality of school experience being described as not assessed.  The patient is currently unemployed and speaks [place] as a primary language.  The patient has no communication impairments affecting communication. The patient's preference for learning can be described as: can read and write adequately.  The patient's hearing is normal.  The patient's vision is normal.      [employer]

## 2019-11-15 NOTE — Behavioral Health Treatment Team (Signed)
PSYCHOSOCIAL ASSESSMENT  :Patient identifying info:   Julian [name] is a 59 y.o., male admitted 11/15/2019 12:24 AM     Presenting problem and precipitating factors: Pt admitted to hospital for SI with plan to run into traffic. Pt stated the trigger for his suicidality and worsening depression is the holidays and feeling like his family does not care about him. Pt stopped taking psychiatric medications a week ago due to "feeling too upset." Pt states he is not eating or sleeping well. Pt had a hx of self-harm via cutting, GAD and MDD. Pt confirms SI upon assessment but states he feels safe in hospital. Pt denies HI AH/VH upon assessment.       Mental status assessment: Pt seen in treatment team room with MD. Pt presented with full affect, depressed mood, slurred speech, fair insight and judgement, calm and cooperative, oriented x4.     Strengths: supportive income    Collateral information: no ROI signed     Current psychiatric /substance abuse providers and contact info: Dr. Obrams- psychiatrist, no case management/therepuetic support    Previous psychiatric/substance abuse providers and response to treatment:  Previous psych hospitalizations    Family history of mental illness or substance abuse: Pt denies    Substance abuse history:  UDS-, BAL <10  Pt denies substance use   Social History     Tobacco Use   ??? Smoking status: Never Smoker   ??? Smokeless tobacco: Never Used   Substance Use Topics   ??? Alcohol use: No     Frequency: Never       History of biomedical complications associated with substance abuse : N/A    Patient's current acceptance of treatment or motivation for change: self-admission    Family constellation: not married, no children    Is significant other involved? No      Describe support system: friends, pt denies family support     Describe living arrangements and home environment: homeless, pt stays with different friends nightly    Health issues: HTN, HLD  Hospital Problems  Never Reviewed           Codes Class Noted POA    Major depressive disorder ICD-10-CM: F32.9  ICD-9-CM: 296.20  11/15/2019 Unknown              Trauma history: None reported    Legal issues: Pt denies     History of military service: None    Financial status: disability     Religious/cultural factors: None reported    Education/work history: 12th grade is highest level of education, pt previously worked    Have you been licensed as a health care professional (current or expired):  No    Leisure and recreation preferences: reading books, listening to music     Describe coping skills: limited and ineffectual    Julian [name]  11/15/2019

## 2019-11-15 NOTE — Progress Notes (Signed)
Behavioral Services  Medicare Certification Upon Admission    I certify that this patient's inpatient psychiatric hospital admission is medically necessary for:    [x] (1) Treatment which could reasonably be expected to improve this patient's condition,       [x] (2) Or for diagnostic study;     AND     [x](2) The inpatient psychiatric services are provided while the individual is under the care of a physician and are included in the individualized plan of care.    Estimated length of stay/service 5-7 days    Plan for post-hospital care home with services    Electronically signed by Eligah Anello L Kathrene Sinopoli, MD on 11/15/2019 at 10:15 PM

## 2019-11-15 NOTE — Progress Notes (Signed)
Problem: Suicide  Goal: *STG: Remains safe in hospital  Outcome: Progressing Towards Goal  Goal: *STG/LTG: Complies with medication therapy  Outcome: Progressing Towards Goal

## 2019-11-15 NOTE — ED Notes (Signed)
Hourly rounding completed on this pt. Offered assistance for toileting or hygiene at this time. Provided opportunity for snack nourishment or PO fluid hydration. Pt is up-to-date on plan of care. No pain interventions required at this time. Warm blanket offered, call bell within reach, safety precautions in place, bed locked and in the lowest position.

## 2019-11-15 NOTE — Progress Notes (Signed)
Problem: Suicide  Goal: *STG: Remains safe in hospital  Outcome: Progressing Towards Goal     Problem: Depressed Mood (Adult/Pediatric)  Goal: *STG: Participates in treatment plan  Outcome: Progressing Towards Goal

## 2019-11-15 NOTE — ED Notes (Signed)
Called security for transport to [facility].

## 2019-11-15 NOTE — Behavioral Health Treatment Team (Signed)
Patient arrived on the unit from [facility] ED on a voluntary admission with the chief complains of suicidal ideation and medicine non compliance. Patient was found by  EMS and he told them that he would run into the traffic.     Patient was cooperative with the admission process, somewhat interactive but had an odd demeanor. He expressed suicidal ideation with the plan to run into traffic during admission process and denied HI/A/V/H. Patient was a high risk for suicide during CSSR assessment.  Per protocol, patient was placed on 1:1, then discontinued after talking to the provider.    Blood pressure was elevated. One of his blood pressure medication has been ordered, rest will be ordered after review of medication list.    Patient was put on hospital gown. Belongings and valuables secured. UDS was negative and BAL >10. Will continue to monitor and provide support as needed.

## 2019-11-15 NOTE — ED Notes (Signed)
Patient ambulated independently to restroom and provided urine sample.

## 2019-11-15 NOTE — H&P (Signed)
INITIAL PSYCHIATRIC EVALUATION            IDENTIFICATION:    Patient Name  Julian [name]   Date of Birth [birth year]   CSN [id number]   Medical Record Number  [id number]      Age  59 y.o.   PCP Other, Phys, MD   Admit date:  11/15/2019    Room Number  956/21  @ [facility]   Date of Service  11/15/2019            HISTORY         REASON FOR HOSPITALIZATION:  CC: "suicidal ideation". Pt admitted under a voluntary basis for suicidal ideations proving to be an imminent danger to self and an inability to care for self.    HISTORY OF PRESENT ILLNESS:    The patient, Julian [name], is a 59 y.o.  BLACK/[place] male with a past psychiatric history significant for MDD, who presents at this time with complaints of (and/or evidence of) the following emotional symptoms: depression and suicidal thoughts/threats.  Additional symptomatology include family stressors.  The above symptoms have been present for 2+ days. These symptoms are of moderate to high severity. These symptoms are intermittent/ fleeting in nature.  The patient's condition has been precipitated by psychosocial stressors.  Patient's condition made worse by treatment noncompliance. UDS: negative; BAL=0.    Per admission documentation, patient reported a desire to run into traffic leading to the current presentation he states that a family member urged him to come in. He reports conditional suicidality and denies prior hospitalizations.    The patient is an unreliable historian. The patient corroborates the above narrative. The patient contracts for safety on the unit and gives consent for the team to contact collateral. The patient is amenable to initiating treatment while on the unit. The patient states that he stopped taking his Wellbutrin due to frustration with his family, he is evasive about the circumstances preceding this situation. The patient reports he lives alone but per SW assessment has been staying on couches.     ALLERGIES:    Allergies   Allergen Reactions   ??? Ace Inhibitors Cough      MEDICATIONS PRIOR TO ADMISSION:   Medications Prior to Admission   Medication Sig   ??? buPROPion XL (WELLBUTRIN XL) 150 mg tablet Take 150 mg by mouth daily. Indications: major depressive disorder, Patient reports last dose x 4 days ago.   ??? hydrOXYzine HCL (ATARAX) 50 mg tablet Take 50 mg by mouth two (2) times a day. Indications: anxious, Patient reports last dose x 4 days ago   ??? naproxen (NAPROSYN) 500 mg tablet Take 500 mg by mouth two (2) times daily as needed for Pain. Indications: pain   ??? hydrALAZINE (APRESOLINE) 50 mg tablet Take 50 mg by mouth two (2) times a day. Indications: high blood pressure, Patient reports last dose x 2 weeks ago.   ??? losartan-hydroCHLOROthiazide (HYZAAR) 100-25 mg per tablet Take 1 Tab by mouth daily. Indications: high blood pressure, Patient reports last dose x 2 weeks ago   ??? pregabalin (LYRICA) 50 mg capsule Take 50 mg by mouth two (2) times a day. Indications: neuropathic pain   ??? polyethylene glycol (Miralax) 17 gram/dose powder Take 17 g by mouth daily. Indications: constipation   ??? atorvastatin (LIPITOR) 40 mg tablet Take 40 mg by mouth nightly. Indications: excessive fat in the blood   ??? hydrocortisone (CORTAID) 1 % topical cream Apply  to affected  area two (2) times daily as needed for Skin Irritation. use thin layer   Indications: inflammation of the skin due to an allergy   ??? lidocaine (XYLOCAINE) 4 % topical cream Apply  to affected area two (2) times daily as needed for Pain. Indications: skin irritation   ??? green tea leaf extract (GREEN TEA PO) Take 500 mg by mouth. Indications: herbal supplement   ??? B-complex with vitamin C (VITAMIN B COMPLEX-C PO) Take 1 Tab by mouth. Indications: vitamin B deficiency   ??? Panax ginseng root (GINSENG KOREAN PO) Take 100 mg by mouth.   ??? eucalypt/men/camph/turp/wh.pet (MEDICATED CHEST RUB EX) by Apply Externally route as needed for Cough.   ??? diphenhydrAMINE-zinc acetate  2%-0.1% (BENADRYL) 2-0.1 % topical cream Apply  to affected area two (2) times daily as needed for Itching.   ??? guaiFENesin (ORGANIDIN) 400 mg tablet Take  by mouth every four (4) hours as needed for Congestion.   ??? ferrous sulfate 325 mg (65 mg iron) tablet Take  by mouth Daily (before breakfast). Indications: anemia from inadequate iron   ??? miconazole nitrate 2 % aerp by Apply Externally route as needed (athlete's foot). Indications: athlete's foot   ??? ginkgo biloba 120 mg tab Take 1 Tab by mouth. Indications: herbal supplement   ??? trolamine salicylate (Arthricream) 10 % topical cream Apply  to affected area as needed for Stiffness.   ??? methyl salicylate/menthol (BEN GAY EX) by Apply Externally route as needed.   ??? acetaminophen (TYLENOL) 500 mg tablet Take 1 Tab by mouth every six (6) hours as needed for Pain. (Patient taking differently: Take 500 mg by mouth every six (6) hours as needed for Pain. Indications: pain)      PAST MEDICAL HISTORY:   Past Medical History:   Diagnosis Date   ??? Anxiety    ??? Chronic constipation    ??? Diabetes ([place])    ??? Hypertension    ??? Major depression    ??? OCD (obsessive compulsive disorder)    History reviewed. No pertinent surgical history.   SOCIAL HISTORY:  The patient is currently on disability; the patient is not a smoker; the patient's marital status is single; the patient does not have children; the patient reports the highest level of education achieved is high school.      FAMILY HISTORY: History reviewed, pertinent family history as below:   History reviewed. No pertinent family history.    REVIEW OF SYSTEMS:   Pertinent items are noted in the History of Present Illness.  All other Systems reviewed and are considered negative.           MENTAL STATUS EXAM & VITALS     MENTAL STATUS EXAM (MSE):    MSE FINDINGS ARE WITHIN NORMAL LIMITS (WNL) UNLESS OTHERWISE STATED BELOW. ( ALL OF THE BELOW CATEGORIES OF THE MSE HAVE BEEN REVIEWED (reviewed 11/15/2019) AND UPDATED AS DEEMED  APPROPRIATE )  General Presentation age appropriate, cooperative and evasive   Orientation oriented to time, place and person   Vital Signs  See below (reviewed 11/15/2019); Vital Signs (BP, Pulse, & Temp) are within normal limits if not listed below.   Gait and Station Stable/steady, no ataxia   Musculoskeletal System No extrapyramidal symptoms (EPS); no abnormal muscular movements or Tardive Dyskinesia (TD); muscle strength and tone are within normal limits   Language No aphasia or dysarthria   Speech:  normal volume and non-pressured   Thought Processes coherent; normal rate of thoughts; fair abstract reasoning/computation  Thought Associations goal directed   Thought Content preoccupations   Suicidal Ideations contracts for safety   Homicidal Ideations none   Mood:  depressed   Affect:  full range and mood-incongruent   Memory recent  intact   Memory remote:  intact   Concentration/Attention:  intact   Fund of Knowledge average   Insight:  limited   Reliability poor   Judgment:  poor          VITALS:     Patient Vitals for the past 24 hrs:   Temp Pulse Resp BP SpO2   11/15/19 0813 97.6 ??F (36.4 ??C) 84 18 139/84 96 %   11/15/19 0457 ??? ??? ??? (!) 168/106 ???   11/15/19 0455 98.7 ??F (37.1 ??C) (!) 57 ??? (!) 169/102 100 %   11/15/19 0306 97.5 ??F (36.4 ??C) 77 ??? (!) 148/89 99 %   11/15/19 0224 ??? ??? ??? (!) 168/109 ???   11/15/19 0223 ??? ??? ??? (!) 168/109 ???   11/15/19 0030 98 ??F (36.7 ??C) 84 18 (!) 181/111 100 %     Wt Readings from Last 3 Encounters:   11/15/19 90.7 kg (200 lb)     Temp Readings from Last 3 Encounters:   11/15/19 97.6 ??F (36.4 ??C)   07/01/17 97.5 ??F (36.4 ??C)     BP Readings from Last 3 Encounters:   11/15/19 139/84   07/01/17 (!) 168/109     Pulse Readings from Last 3 Encounters:   11/15/19 84   07/01/17 78            DATA     LABORATORY DATA:  Labs Reviewed   CBC WITH AUTOMATED DIFF - Abnormal; Notable for the following components:       Result Value    IMMATURE GRANULOCYTES 1 (*)     All other components within  normal limits   METABOLIC PANEL, COMPREHENSIVE - Abnormal; Notable for the following components:    Glucose 124 (*)     Creatinine 1.32 (*)     GFR est non-AA 56 (*)     AST (SGOT) 50 (*)     A-G Ratio 0.9 (*)     All other components within normal limits   DRUG SCREEN, URINE   URINALYSIS W/ REFLEX CULTURE   ETHYL ALCOHOL   SARS-COV-2   COVID-19 WITH INFLUENZA A/B     Admission on 11/15/2019   Component Date Value Ref Range Status   ??? AMPHETAMINES 11/15/2019 Negative  NEG   Final   ??? BARBITURATES 11/15/2019 Negative  NEG   Final   ??? BENZODIAZEPINES 11/15/2019 Negative  NEG   Final   ??? COCAINE 11/15/2019 Negative  NEG   Final   ??? METHADONE 11/15/2019 Negative  NEG   Final   ??? OPIATES 11/15/2019 Negative  NEG   Final   ??? PCP(PHENCYCLIDINE) 11/15/2019 Negative  NEG   Final   ??? THC (TH-CANNABINOL) 11/15/2019 Negative  NEG   Final   ??? Drug screen comment 11/15/2019 (NOTE)   Final   ??? Color 11/15/2019 YELLOW/STRAW    Final   ??? Appearance 11/15/2019 CLEAR  CLEAR   Final   ??? Specific gravity 11/15/2019 1.010  1.003 - 1.030   Final   ??? pH (UA) 11/15/2019 7.0  5.0 - 8.0   Final   ??? Protein 11/15/2019 Negative  NEG mg/dL Final   ??? Glucose 11/15/2019 Negative  NEG mg/dL Final   ??? Ketone 11/15/2019 Negative  NEG mg/dL  Final   ??? Bilirubin 11/15/2019 Negative  NEG   Final   ??? Blood 11/15/2019 Negative  NEG   Final   ??? Urobilinogen 11/15/2019 1.0  0.2 - 1.0 EU/dL Final   ??? Nitrites 11/15/2019 Negative  NEG   Final   ??? Leukocyte Esterase 11/15/2019 Negative  NEG   Final   ??? WBC 11/15/2019 0-4  0 - 4 /hpf Final   ??? RBC 11/15/2019 0-5  0 - 5 /hpf Final   ??? Epithelial cells 11/15/2019 FEW  FEW /lpf Final   ??? Bacteria 11/15/2019 Negative  NEG /hpf Final   ??? UA:UC IF INDICATED 11/15/2019 CULTURE NOT INDICATED BY UA RESULT  CNI   Final   ??? ALCOHOL(ETHYL),SERUM 11/15/2019 <10  <10 MG/DL Final   ??? WBC 11/15/2019 7.2  4.1 - 11.1 K/uL Final   ??? RBC 11/15/2019 4.37  4.10 - 5.70 M/uL Final   ??? HGB 11/15/2019 12.5  12.1 - 17.0 g/dL Final   ??? HCT  11/15/2019 37.0  36.6 - 50.3 % Final   ??? MCV 11/15/2019 84.7  80.0 - 99.0 FL Final   ??? MCH 11/15/2019 28.6  26.0 - 34.0 PG Final   ??? MCHC 11/15/2019 33.8  30.0 - 36.5 g/dL Final   ??? RDW 11/15/2019 13.3  11.5 - 14.5 % Final   ??? PLATELET 11/15/2019 248  150 - 400 K/uL Final   ??? MPV 11/15/2019 10.3  8.9 - 12.9 FL Final   ??? NRBC 11/15/2019 0.0  0 PER 100 WBC Final   ??? ABSOLUTE NRBC 11/15/2019 0.00  0.00 - 0.01 K/uL Final   ??? NEUTROPHILS 11/15/2019 55  32 - 75 % Final   ??? LYMPHOCYTES 11/15/2019 29  12 - 49 % Final   ??? MONOCYTES 11/15/2019 9  5 - 13 % Final   ??? EOSINOPHILS 11/15/2019 5  0 - 7 % Final   ??? BASOPHILS 11/15/2019 1  0 - 1 % Final   ??? IMMATURE GRANULOCYTES 11/15/2019 1* 0.0 - 0.5 % Final   ??? ABS. NEUTROPHILS 11/15/2019 4.0  1.8 - 8.0 K/UL Final   ??? ABS. LYMPHOCYTES 11/15/2019 2.1  0.8 - 3.5 K/UL Final   ??? ABS. MONOCYTES 11/15/2019 0.7  0.0 - 1.0 K/UL Final   ??? ABS. EOSINOPHILS 11/15/2019 0.3  0.0 - 0.4 K/UL Final   ??? ABS. BASOPHILS 11/15/2019 0.1  0.0 - 0.1 K/UL Final   ??? ABS. IMM. GRANS. 11/15/2019 0.0  0.00 - 0.04 K/UL Final   ??? DF 11/15/2019 AUTOMATED    Final   ??? Sodium 11/15/2019 141  136 - 145 mmol/L Final   ??? Potassium 11/15/2019 4.3  3.5 - 5.1 mmol/L Final   ??? Chloride 11/15/2019 103  97 - 108 mmol/L Final   ??? CO2 11/15/2019 29  21 - 32 mmol/L Final   ??? Anion gap 11/15/2019 9  5 - 15 mmol/L Final   ??? Glucose 11/15/2019 124* 65 - 100 mg/dL Final   ??? BUN 11/15/2019 16  6 - 20 MG/DL Final   ??? Creatinine 11/15/2019 1.32* 0.70 - 1.30 MG/DL Final   ??? BUN/Creatinine ratio 11/15/2019 12  12 - 20   Final   ??? GFR est AA 11/15/2019 >60  >60 ml/min/1.52m Final   ??? GFR est non-AA 11/15/2019 56* >60 ml/min/1.770mFinal   ??? Calcium 11/15/2019 9.0  8.5 - 10.1 MG/DL Final   ??? Bilirubin, total 11/15/2019 0.3  0.2 - 1.0 MG/DL Final   ??? ALT (SGPT)  11/15/2019 73  12 - 78 U/L Final   ??? AST (SGOT) 11/15/2019 50* 15 - 37 U/L Final   ??? Alk. phosphatase 11/15/2019 71  45 - 117 U/L Final   ??? Protein, total 11/15/2019 7.2  6.4 -  8.2 g/dL Final   ??? Albumin 11/15/2019 3.5  3.5 - 5.0 g/dL Final   ??? Globulin 11/15/2019 3.7  2.0 - 4.0 g/dL Final   ??? A-G Ratio 11/15/2019 0.9* 1.1 - 2.2   Final   ??? SARS-CoV-2 11/15/2019 Please find results under separate order    Final   ??? SARS-CoV-2 11/15/2019 Not detected  NOTD   Final   ??? Influenza A by PCR 11/15/2019 Not detected  NOTD   Final   ??? Influenza B by PCR 11/15/2019 Not detected  NOTD   Final        RADIOLOGY REPORTS:  No results found for this or any previous visit.No results found.           MEDICATIONS       ALL MEDICATIONS  Current Facility-Administered Medications   Medication Dose Route Frequency   ??? OLANZapine (ZyPREXA) tablet 5 mg  5 mg Oral Q6H PRN   ??? haloperidol lactate (HALDOL) injection 5 mg  5 mg IntraMUSCular Q6H PRN   ??? benztropine (COGENTIN) tablet 1 mg  1 mg Oral BID PRN   ??? diphenhydrAMINE (BENADRYL) injection 50 mg  50 mg IntraMUSCular BID PRN   ??? hydrOXYzine HCL (ATARAX) tablet 50 mg  50 mg Oral TID PRN   ??? LORazepam (ATIVAN) injection 1 mg  1 mg IntraMUSCular Q4H PRN   ??? traZODone (DESYREL) tablet 50 mg  50 mg Oral QHS PRN   ??? magnesium hydroxide (MILK OF MAGNESIA) 400 mg/5 mL oral suspension 30 mL  30 mL Oral DAILY PRN   ??? hydrALAZINE (APRESOLINE) tablet 50 mg  50 mg Oral BID   ??? [START ON 11/16/2019] polyethylene glycol (MIRALAX) packet 17 g  17 g Oral DAILY   ??? naproxen (NAPROSYN) tablet 500 mg  500 mg Oral Q8H PRN   ??? pregabalin (LYRICA) capsule 100 mg  100 mg Oral DAILY   ??? buPROPion XL (WELLBUTRIN XL) tablet 150 mg  150 mg Oral QHS   ??? acetaminophen (TYLENOL) tablet 650 mg  650 mg Oral Q4H PRN   ??? [START ON 11/16/2019] losartan/hydroCHLOROthiazide (HYZAAR) 100/25 mg   Oral DAILY   ??? atorvastatin (LIPITOR) tablet 40 mg  40 mg Oral QHS      SCHEDULED MEDICATIONS  Current Facility-Administered Medications   Medication Dose Route Frequency   ??? hydrALAZINE (APRESOLINE) tablet 50 mg  50 mg Oral BID   ??? [START ON 11/16/2019] polyethylene glycol (MIRALAX) packet 17 g  17 g Oral DAILY    ??? pregabalin (LYRICA) capsule 100 mg  100 mg Oral DAILY   ??? buPROPion XL (WELLBUTRIN XL) tablet 150 mg  150 mg Oral QHS   ??? [START ON 11/16/2019] losartan/hydroCHLOROthiazide (HYZAAR) 100/25 mg   Oral DAILY   ??? atorvastatin (LIPITOR) tablet 40 mg  40 mg Oral QHS                ASSESSMENT & PLAN        The patient, Julian [name], is a 59 y.o.  male who presents at this time for treatment of the following diagnoses:  Patient Active Hospital Problem List:   Major depressive disorder (11/15/2019)    Assessment: patient with ongoing family stressors leading to hospitalization. No  substances on board and patient denies prior episodes. He states that he went off antidepressant and is now feeling suicidal. Unclear housing situation and secondary gain may be a factor. Will work with patient to secure safe disposition plan.    Plan:  - RESTART Wellbutrin XL 150 mg QDAY for MDD  - IGM therapy as tolerated  - Expand database / obtain collateral  - Dispo planning           A coordinated, multidisplinary treatment team (includes the nurse, unit pharmacist, Catering manager) round was conducted for this initial evaluation with the patient present.     The following regarding medications was addressed during rounds with patient: the risks and benefits of the proposed medication. The patient was given the opportunity to ask questions. Informed consent given to the use of the above medications.    I will continue to adjust psychiatric and non-psychiatric medications (see above "medication" section and orders section for details) as deemed appropriate & based upon diagnoses and response to treatment. I have reviewed admission (and previous/old) labs and medical tests in the EHR and or transferring hospital documents. I will continue to order blood tests/labs and diagnostic tests as deemed appropriate and review results as they become available (see orders for details). I have reviewed old psychiatric and medical records available  in the EHR. I Will order additional psychiatric records from other institutions to further elucidate the nature of patient's psychopathology and review once available.    I will gather additional collateral information from friends, family and o/p treatment team to further elucidate the nature of patient's psychopathology and baselline level of psychiatric functioning.    I certify that this patient's inpatient psychiatric hospital services are required for treatment that could reasonably be expected to improve the patient's condition, or for diagnostic study, and that the patient continues to need, on a daily basis, active treatment furnished directly by or requiring the supervision of inpatient psychiatric facility personnel. In addition, the hospital records show that services furnished were intensive treatment services, admission or related services, or equivalent services.      ESTIMATED LENGTH OF STAY:  2-3 days       STRENGTHS:  Exercising self-direction/Resourceful, Interpersonal/supportive relationships (family, friends, peers) and Knowledge of medications                                        SIGNED:    Raeford Razor, MD  11/15/2019

## 2019-11-15 NOTE — Behavioral Health Treatment Team (Signed)
Assumed care of the patient. Patient was out in the day room. He was observed making arts. He seemed euthymic and was cooperative with the assessments.Patient expressed suicidal ideations with the plan to run into the traffic and added he cannot do it here. Patient denied H.I/A/V/H and confirmed depression. Patient said he feels alone and has no support from family members. Patient was medication and meal compliant. No PRNs needed or requested. Blood pressure was elevated, on reassessment it had somewhat decreased.     Will continue to monitor and provide support as needed. Patient slept for about 7.5 hours.

## 2019-11-15 NOTE — ED Notes (Signed)
TRANSFER - OUT REPORT:    Verbal report given to Shreya, RN on Julian [name]  being transferred to [facility] for routine progression of care       Report consisted of patient???s Situation, Background, Assessment and   Recommendations(SBAR).     Information from the following report(s) SBAR, ED Summary, Intake/Output, MAR, Recent Results and Med Rec Status was reviewed with the receiving nurse.    Lines:       Opportunity for questions and clarification was provided.      Patient transported with: Security

## 2019-11-15 NOTE — ED Triage Notes (Addendum)
Patient presents to ED voluntarily via EMS c/o SI with a plan to run into traffic. Patient reports he stopped taking his Wellbutrin and Hydroxyzine x 4 days ago because he stated "I felt depressed and didn't want to live anymore, I still don't want to live anymore. I hate my family, they make me feel like an outkast and look at me crazy. They have no compassion." Patient denies HI/hallucinations. Patient hypertensive enroute with EMS and on arrival, patient reports last dose of HTN medications was x 2 weeks ago. Patient calm, cooperative, and pleasant.

## 2019-11-15 NOTE — ED Notes (Signed)
Spoke to Priscilla ([facility]), patient has been accepted on [facility], awaiting bed assignment.

## 2019-11-15 NOTE — Progress Notes (Signed)
Problem: Suicide  Goal: *STG: Remains safe in hospital  Outcome: Progressing Towards Goal  Goal: *STG/LTG: Complies with medication therapy  Outcome: Progressing Towards Goal     Problem: Depressed Mood (Adult/Pediatric)  Goal: *STG: Participates in treatment plan  Outcome: Progressing Towards Goal  Goal: *STG: Remains safe in hospital  Outcome: Progressing Towards Goal  Goal: *STG: Complies with medication therapy  Outcome: Progressing Towards Goal   0700-0730 Report received from Shreya,RN.     0813 Patient laying in bed with eyes closed. Easily aroused when spoken to. Patient is calm and cooperative with assessment. AAOX4. Speech clear. Resp. Even and unlabored. NAD noted. Ambulates with steady gait without the use of assistive devices. Patient reports having SI but can not go through with while he is here. Patient denies HI, AVH, and pain. Patient reports anxiety and depression are both 7 or 8 at this time. No needs, wants, or concerns voiced at this time. Will continue to monitor.     1034 Patient awake laying in bed. NAD noted.     1104 Patient laying in bed with eyes closed. NAD noted.     1210 Patient sitting in the dayroom eating lunch. NAD noted.     1315 Patient laying in bed with eyes closed. NAD noted.     1405 Patient laying in bed with eyes closed. NAD noted.     1512 Patient laying in bed with eyes closed. Easily aroused when spoken to. Patient refused lyrica at this time and stated, "it's as needed." This nurse verbalized understanding.     1714 Patient sitting in the dayroom eating. NAD noted.

## 2019-11-15 NOTE — ED Notes (Signed)
Priscilla ([facility]) @ bedside with patient.

## 2019-11-15 NOTE — Progress Notes (Signed)
Behavioral Services  Medicare Certification Upon Admission    I certify that this patient's inpatient psychiatric hospital admission is medically necessary for:    [x]  (1) Treatment which could reasonably be expected to improve this patient's condition,       [x]  (2) Or for diagnostic study;     AND     [x] (2) The inpatient psychiatric services are provided while the individual is under the care of a physician and are included in the individualized plan of care.    Estimated length of stay/service 5-7 days    Plan for post-hospital care home with services    Electronically signed by , MD on 11/15/2019 at 10:15 PM

## 2019-11-15 NOTE — Progress Notes (Signed)
 Problem: Suicide  Goal: *STG: Remains safe in hospital  Outcome: Progressing Towards Goal  Goal: *STG/LTG: Complies with medication therapy  Outcome: Progressing Towards Goal     Problem: Depressed Mood (Adult/Pediatric)  Goal: *STG: Participates in treatment plan  Outcome: Progressing Towards Goal  Goal: *STG: Remains safe in hospital  Outcome: Progressing Towards Goal  Goal: *STG: Complies with medication therapy  Outcome: Progressing Towards Goal   0700-0730 Report received from [facility].     0813 Patient laying in bed with eyes closed. Easily aroused when spoken to. Patient is calm and cooperative with assessment. AAOX4. Speech clear. Resp. Even and unlabored. NAD noted. Ambulates with steady gait without the use of assistive devices. Patient reports having SI but can not go through with while he is here. Patient denies HI, AVH, and pain. Patient reports anxiety and depression are both 7 or 8 at this time. No needs, wants, or concerns voiced at this time. Will continue to monitor.     1034 Patient awake laying in bed. NAD noted.     1104 Patient laying in bed with eyes closed. NAD noted.     1210 Patient sitting in the dayroom eating lunch. NAD noted.     1315 Patient laying in bed with eyes closed. NAD noted.     1405 Patient laying in bed with eyes closed. NAD noted.     1512 Patient laying in bed with eyes closed. Easily aroused when spoken to. Patient refused lyrica at this time and stated, it's as needed. This nurse verbalized understanding.     1714 Patient sitting in the dayroom eating. NAD noted.

## 2019-11-15 NOTE — ED Notes (Signed)
 Patient presents to ED voluntarily via EMS c/o SI with a plan to run into traffic. Patient reports he stopped taking his Wellbutrin and Hydroxyzine x 4 days ago because he stated I felt depressed and didn't want to live anymore, I still don't want to live anymore. I hate my family, they make me feel like an outkast and look at me crazy. They have no compassion. Patient denies HI/hallucinations. Patient hypertensive enroute with EMS and on arrival, patient reports last dose of HTN medications was x 2 weeks ago. Patient calm, cooperative, and pleasant.

## 2019-11-15 NOTE — ED Provider Notes (Signed)
ED Provider Notes by Raelyn Mora, MD at 11/15/19 0102                Author: Raelyn Mora, MD  Service: EMERGENCY  Author Type: Physician       Filed: 11/15/19 0418  Date of Service: 11/15/19 0102  Status: Signed          Editor: Raelyn Mora, MD (Physician)               EMERGENCY DEPARTMENT HISTORY AND PHYSICAL EXAM        Please note that this dictation was completed with Dragon, the computer voice recognition software. Quite often unanticipated grammatical, syntax, homophones, and other interpretive  errors are inadvertently transcribed by the computer software. Please disregard these errors.  Please excuse any errors that have escaped final proofreading.      Date: 11/15/2019   Patient Name: Julian WILLIAMSEN   Patient Age and Sex: 59 y.o.  male        History of Presenting Illness          Chief Complaint       Patient presents with        ?  Mental Health Problem           History Provided By: Patient      HPI: Julian [name], is a  59 y.o. male whose medical history is noted below and includes depression, anxiety, HTN,  HLD, presents to the ED with depression, suicidal ideations and with a plan to step into traffic.  The patient denies having any medical complaints, also denies any recent history of injuries and denies having pain at this time.       He is on medication for high blood pressure as well as high cholesterol, reports that he is compliant with both medications but has always had trouble managing his blood pressure.  He states that his blood pressure is particularly high whenever he is  upset, such as right now. Normally it is around 542H-062B systolic.     In addition to his blood pressure and cholesterol medications, the patient is also taking 2 psychiatric medications, he states both of them are for depression.  1 is bupropion, the second he cannot recall the name but believes is Lyrica.  He stopped taking  both of these medications approximately 2 weeks ago because "what is the point".   When asked to clarify, patient states that the medications are not helping him and he wants to end his life anyway.      The trigger for his suicidality and worsening depression is the holidays.  He states that the majority of his family lives in [place] and he moved to [place] approximately 2 years ago.  He does not believe that his family cares for him. he recently  tried to call them to see how everyone is doing given that easter is coming up. He found that his family responded Coley to him and seemed uncaring.      He specifically denies having any recent fevers, chills, abd pain, n/v, changes in appetite, chest pain, palpitations, cough shortness of breath, lower extremity edema, orthopnea, PND or changes in urine output.   No taste/smell sensory loss. No known covid 19 exposures.      Pt denies any other alleviating or exacerbating factors. No other associated signs or symptoms. There are no other complaints, changes or physical findings at this time.  PCP: Other, Phys, MD        Past History     All documented elements of the [place] reviewed and verified by me. -Charolotte Capuchin, MD      Past Medical History:     Past Medical History:        Diagnosis  Date         ?  Anxiety       ?  Chronic constipation       ?  Diabetes ([place])       ?  Hypertension       ?  Major depression           ?  OCD (obsessive compulsive disorder)             Past Surgical History:   History reviewed. No pertinent surgical history.      Family History:   History reviewed. No pertinent family history.      Social History:     Social History          Tobacco Use         ?  Smoking status:  Never Smoker     ?  Smokeless tobacco:  Never Used       Substance Use Topics         ?  Alcohol use:  No              Frequency:  Never         ?  Drug use:  No           Allergies:     Allergies        Allergen  Reactions         ?  Ace Inhibitors  Cough             Review of Systems     All other systems reviewed and negative      Review  of Systems    Constitutional: Negative for appetite change and fever.    HENT: Negative.     Eyes: Negative.     Respiratory: Negative for cough and shortness of breath.     Cardiovascular: Negative for chest pain and palpitations.    Gastrointestinal: Negative for abdominal pain, nausea and vomiting.    Endocrine: Negative.     Genitourinary: Negative for dysuria, flank pain and hematuria.    Musculoskeletal: Negative for back pain and joint swelling.    Skin: Negative.  Negative for rash.    Neurological: Negative for dizziness, weakness, light-headedness, numbness and headaches.    Hematological: Negative for adenopathy.    Psychiatric/Behavioral: Positive for sleep disturbance and suicidal ideas . Negative for confusion, hallucinations and self-injury.    All other systems reviewed and are negative.           Physical Exam     Reviewed patients vital signs and nursing note      Physical Exam   Vitals signs and nursing note reviewed.   HENT :       Head: Atraumatic.      Mouth/Throat:      Mouth: Mucous membranes are moist.    Eyes:       General: No scleral icterus.     Extraocular Movements: Extraocular movements intact.      Conjunctiva/sclera: Conjunctivae normal.      Pupils: Pupils are equal, round, and reactive to light.   Neck:       Musculoskeletal: Normal range of  motion and neck supple.   Cardiovascular :       Rate and Rhythm: Normal rate and regular rhythm.      Pulses: Normal pulses.      Heart sounds: Normal heart sounds.    Pulmonary:       Effort: Pulmonary effort is normal.      Breath sounds: Normal breath sounds.   Abdominal :      Palpations: Abdomen is soft.      Tenderness: There is no abdominal tenderness.     Musculoskeletal: Normal range of motion.    Skin:      General: Skin is warm and dry.      Capillary Refill: Capillary refill takes less than 2 seconds.    Neurological:       General: No focal deficit present.      Mental Status: He is alert.    Psychiatric:         Attention and  Perception: Attention normal. He does not perceive auditory or visual hallucinations.         Mood and Affect: Affect normal. Mood is  depressed.         Speech: Speech normal.         Behavior: Behavior is cooperative.         Thought Content: Thought content includes  suicidal ideation. Thought content does not include homicidal ideation. Thought content includes suicidal  plan. Thought content does not include homicidal plan.               Diagnostic Study Results        Labs - I have personally  reviewed and interpreted all laboratory results. Charolotte Capuchin, MD,  MSc     Recent Results (from the past 24 hour(s))     DRUG SCREEN, URINE          Collection Time: 11/15/19  2:14 AM         Result  Value  Ref Range            AMPHETAMINES  Negative  NEG         BARBITURATES  Negative  NEG         BENZODIAZEPINES  Negative  NEG         COCAINE  Negative  NEG         METHADONE  Negative  NEG         OPIATES  Negative  NEG         PCP(PHENCYCLIDINE)  Negative  NEG         THC (TH-CANNABINOL)  Negative  NEG         Drug screen comment  (NOTE)         URINALYSIS W/ REFLEX CULTURE          Collection Time: 11/15/19  2:14 AM       Specimen: Urine         Result  Value  Ref Range            Color  YELLOW/STRAW          Appearance  CLEAR  CLEAR         Specific gravity  1.010  1.003 - 1.030         pH (UA)  7.0  5.0 - 8.0         Protein  Negative  NEG mg/dL       Glucose  Negative  NEG mg/dL  Ketone  Negative  NEG mg/dL       Bilirubin  Negative  NEG         Blood  Negative  NEG         Urobilinogen  1.0  0.2 - 1.0 EU/dL       Nitrites  Negative  NEG         Leukocyte Esterase  Negative  NEG         WBC  0-4  0 - 4 /hpf       RBC  0-5  0 - 5 /hpf       Epithelial cells  FEW  FEW /lpf       Bacteria  Negative  NEG /hpf       UA:UC IF INDICATED  CULTURE NOT INDICATED BY UA RESULT  CNI         ETHYL ALCOHOL          Collection Time: 11/15/19  2:14 AM         Result  Value  Ref Range            ALCOHOL(ETHYL),SERUM  <10  <10  MG/DL       CBC WITH AUTOMATED DIFF          Collection Time: 11/15/19  2:14 AM         Result  Value  Ref Range            WBC  7.2  4.1 - 11.1 K/uL       RBC  4.37  4.10 - 5.70 M/uL            HGB  12.5  12.1 - 17.0 g/dL            HCT  37.0  36.6 - 50.3 %       MCV  84.7  80.0 - 99.0 FL       MCH  28.6  26.0 - 34.0 PG       MCHC  33.8  30.0 - 36.5 g/dL       RDW  13.3  11.5 - 14.5 %       PLATELET  248  150 - 400 K/uL       MPV  10.3  8.9 - 12.9 FL       NRBC  0.0  0 PER 100 WBC       ABSOLUTE NRBC  0.00  0.00 - 0.01 K/uL       NEUTROPHILS  55  32 - 75 %       LYMPHOCYTES  29  12 - 49 %       MONOCYTES  9  5 - 13 %       EOSINOPHILS  5  0 - 7 %       BASOPHILS  1  0 - 1 %       IMMATURE GRANULOCYTES  1 (H)  0.0 - 0.5 %       ABS. NEUTROPHILS  4.0  1.8 - 8.0 K/UL       ABS. LYMPHOCYTES  2.1  0.8 - 3.5 K/UL       ABS. MONOCYTES  0.7  0.0 - 1.0 K/UL       ABS. EOSINOPHILS  0.3  0.0 - 0.4 K/UL       ABS. BASOPHILS  0.1  0.0 - 0.1 K/UL       ABS. IMM. GRANS.  0.0  0.00 - 0.04  K/UL       DF  AUTOMATED          METABOLIC PANEL, COMPREHENSIVE          Collection Time: 11/15/19  2:14 AM         Result  Value  Ref Range            Sodium  141  136 - 145 mmol/L       Potassium  4.3  3.5 - 5.1 mmol/L       Chloride  103  97 - 108 mmol/L       CO2  29  21 - 32 mmol/L       Anion gap  9  5 - 15 mmol/L       Glucose  124 (H)  65 - 100 mg/dL       BUN  16  6 - 20 MG/DL            Creatinine  1.32 (H)  0.70 - 1.30 MG/DL            BUN/Creatinine ratio  12  12 - 20         GFR est AA  >60  >60 ml/min/1.41m       GFR est non-AA  56 (L)  >60 ml/min/1.744m      Calcium  9.0  8.5 - 10.1 MG/DL       Bilirubin, total  0.3  0.2 - 1.0 MG/DL       ALT (SGPT)  73  12 - 78 U/L       AST (SGOT)  50 (H)  15 - 37 U/L       Alk. phosphatase  71  45 - 117 U/L       Protein, total  7.2  6.4 - 8.2 g/dL       Albumin  3.5  3.5 - 5.0 g/dL       Globulin  3.7  2.0 - 4.0 g/dL       A-G Ratio  0.9 (L)  1.1 - 2.2         SARS-COV-2          Collection  Time: 11/15/19  2:14 AM         Result  Value  Ref Range            SARS-CoV-2  Please find results under separate order          COVID-19 WITH INFLUENZA A/B          Collection Time: 11/15/19  2:14 AM         Result  Value  Ref Range            SARS-CoV-2  Not detected  NOTD         Influenza A by PCR  Not detected  NOTD              Influenza B by PCR  Not detected  NOTD             Radiologic Studies - I have personally reviewed and interpreted  all imaging studies and agree with radiology interpretation and report. - TaCharolotte CapuchinMD, MSc     No orders to display                Medical Decision Making     I am the first provider for this patient.      Records Reviewed: I reviewed our  electronic medical record system for any past medical records that were available that may contribute to the  patient's current condition, including their PMH, surgical history, social and family history. Reviewed the nursing notes and vital signs from today's visit. Nursing notes will be reviewed as they become available in realtime while the pt has been in  the ED. In addition, I read most recent discharge summaries, if available and reviewed prior ECGs or imaging studies for comparison purposes.   Charolotte Capuchin, MD Msc      Vital Signs-Reviewed the patient's vital signs.   Patient Vitals for the past 24 hrs:            Temp  Pulse  Resp  BP  SpO2            11/15/19 0306  97.5 ??F (36.4 ??C)  77  --  (!) 148/89  99 %            11/15/19 0224  --  --  --  (!) 168/109  --     11/15/19 0223  --  --  --  (!) 168/109  --            11/15/19 0030  98 ??F (36.7 ??C)  84  18  (!) 181/111  100 %           Provider Notes (Medical Decision Making):    This patient presents with symptoms consistent with an underlying psychiatric disorder, specifically depression that has worsened after he reached out to his family. He is now suicidal with plan to  jump in front of traffic.   Patient appears well groomed. He is however homeless, states that he stays  "here and there and wherever I can".  Homelessness is another trigger for him, particularly during the holidays.   He does not likely seem intoxicated also denies any drug or alcohol use.      Presentation not consistent with acute organic causes to include delirium, dementia or drug induced disorders (acute ingestions or withdrawal; no evidence of toxidrome). Given the H&P, I suspect this patient is suicidal and as a result potentially seriously  disabled and will require psychiatric care. Will consult [facility] to evaluate the patient for potential admission vs advise on further management and outpatient follow up if discharged.       3:12 AM   Patient is medically cleared.  [facility] has evaluated him and they plan to admit.      Of note in regard to the patient's hypertension: He confirms that he has been compliant with his blood pressure medications and it is only the psychiatric medications that he stopped 2 weeks  ago.  His initial blood pressure was elevated and I suspect the reason for it was, as the patient stated, his mental state when he initially arrived as he was quite agitated while recounting his family's response to his attempt to reach out to them.   He has since calmed down significantly and his blood pressure has come down to baseline levels for the patient.  Last measurement by me personally using a manual cuff at bedside showed a blood pressure of 146/88.     I do not recommend lowering his blood pressure any further very quickly as this alone can have very harmful outcomes on the patient including hypoperfusion of vital organs.  Rather, high blood pressure if it is normalized should be done so in a gradual  fashion, not emergently.  I recommend that  he continue taking his blood pressure medications as he is prescribed right now, the next dose would be in the morning.  Once blood pressures are known with him being calm and on his medications, further adjustment  of dosing can be gradually  made, if needed.         ED Course:    Initial assessment performed. The patients presenting problems have been discussed, and they are in agreement with the care plan formulated and outlined with them.  I have encouraged them to ask questions as they arise throughout their visit.         Consult Note:   Charolotte Capuchin, MD spoke with  [facility],    Discussed pt's hx, physical exam and available diagnostic and imaging results. Reviewed care plans. Agree with management and plan thus far.       DISPOSITION: ADMIT TO PSYCH   Patient is being admitted to the hospital.  Their test results and reasons for admission have been discussed. The patient and/or available family express agreement with and understanding of the need  to be admitted and their admission diagnosis.       Thank you for resuming the care of this patient.   Please don't hesitate to contact me in the emergency department if you  have any additional questions.      Charolotte Capuchin, MD, MSc         I, Raelyn Mora, MD, am the attending of record for this patient encounter.        Diagnosis        Clinical Impression:       1.  Suicidal ideations      2.  Severe episode of recurrent major depressive disorder, without psychotic features ([place])         3.  Essential hypertension            Attestation:   I personally performed the services described in this documentation on this date 11/15/2019 for patient Julian [name].  Charolotte Capuchin, MD

## 2019-11-15 NOTE — ED Notes (Signed)
Spoke to [facility]), patient has been accepted on [facility], awaiting bed assignment.

## 2019-11-15 NOTE — ED Notes (Signed)
[facility]) @ bedside with patient.

## 2019-11-15 NOTE — Behavioral Health Treatment Team (Signed)
Assumed care of the patient. Patient was out in the day room. He was observed making arts. He seemed euthymic and was cooperative with the assessments.Patient expressed suicidal ideations with the plan to run into the traffic and added he cannot do it here. Patient denied H.I/A/V/H and confirmed depression. Patient said he feels alone and has no support from family members. Patient was medication and meal compliant. No PRNs needed or requested. Blood pressure was elevated, on reassessment it had somewhat decreased.     Will continue to monitor and provide support as needed. Patient slept for about 7.5 hours.

## 2019-11-15 NOTE — ED Notes (Signed)
Patient changed into green gown and paper scrub pants. 3 patient bags, 1 large luggage bag, and a small luggage bag secured. Excess linen/bags/cords removed from patient's room.

## 2019-11-15 NOTE — Progress Notes (Signed)
 [facility] Admission Pharmacy Medication Reconciliation    Information obtained from:  . Patient's prescription bottles mostly from [facility], [place] [phone]  RxQuery data available1: none    Comments/recommendations:    1) This pharmacist attempted phone interview with patient, but patient did not stay on phone to answer any questions. Patient interview NOT completed. PTA list below based on patient's prescription bottles mostly from [facility], [place] [phone] and OTC products brought in by patient and bagged/logged into inpatient pharmacy.    2) Medication changes to PTA list:    Added  . From patients medications bottles, many OTC medications/supplements were added:  o hydrocortisone (CORTAID) 1 % topical cream o Apply  to affected area two (2) times daily as needed for Skin Irritation. use thin layer   Indications: inflammation of the skin due to an allergy o   lidocaine (XYLOCAINE) 4 % topical cream o Apply  to affected area two (2) times daily as needed for Pain. Indications: skin irritation o   green tea leaf extract (GREEN TEA PO) o Take 500 mg by mouth. Indications: herbal supplement o   B-complex with vitamin C (VITAMIN B COMPLEX-C PO) o Take 1 Tab by mouth. Indications: vitamin B deficiency o   Panax ginseng root (GINSENG KOREAN PO) o Take 100 mg by mouth. o   eucalypt/men/camph/turp/wh.pet (MEDICATED CHEST RUB EX) o by Apply Externally route as needed for Cough. o   diphenhydrAMINE-zinc acetate 2%-0.1% (BENADRYL) 2-0.1 % topical cream o Apply  to affected area two (2) times daily as needed for Itching. o   guaiFENesin (ORGANIDIN) 400 mg tablet o Take  by mouth every four (4) hours as needed for Congestion. o   ferrous sulfate 325 mg (65 mg iron) tablet o Take  by mouth Daily (before breakfast). Indications: anemia from inadequate iron o   miconazole nitrate 2 % spray o by Apply Externally route as needed (athlete's foot). Indications: athlete's foot o   ginkgo biloba  120 mg tab o Take 1 Tab by mouth. Indications: herbal supplement o   trolamine salicylate (Arthricream) 10 % topical cream o Apply  to affected area as needed for Stiffness. o   methyl salicylate/menthol (BEN GAY EX) o by Apply Externally route as needed.     Removed  . none  Adjusted  . Losartan/HCTZ 100 mg/12.5 mg po daily changed to 100 mg/25 mg po daily    3) The [place]  Prescription Monitoring Program (PMP) was accessed to determine fill history of any controlled medications:  . 11/13/19: Pregabalin 50 mg po BID #60 (30-day supply) Note: patient takes differently: 100 mg po daily.        1RxQuery pharmacy benefit data reflects medications filled and processed through the patient's insurance, however                this data does NOT capture whether the medication was picked up or is currently being taken by the patient.       Past Medical History/Disease States:  Past Medical History:   Diagnosis Date   . Anxiety    . Chronic constipation    . Diabetes ([facility])    . Hypertension    . Major depression    . OCD (obsessive compulsive disorder)          Patient allergies:   Allergies as of 11/15/2019 - Review Complete 11/15/2019   Allergen Reaction Noted   . Ace inhibitors Cough 07/01/2017  Prior to Admission Medications   Prescriptions Last Dose Informant  Taking?   B-complex with vitamin C (VITAMIN B COMPLEX-C PO)    Yes   Sig: Take 1 Tab by mouth. Indications: vitamin B deficiency   Panax ginseng root (GINSENG KOREAN PO)    Yes   Sig: Take 100 mg by mouth.   acetaminophen (TYLENOL) 500 mg tablet    Yes   Sig: Take 1 Tab by mouth every six (6) hours as needed for Pain.   Patient taking differently: Take 500 mg by mouth every six (6) hours as needed for Pain. Indications: pain   atorvastatin (LIPITOR) 40 mg tablet    Yes   Sig: Take 40 mg by mouth nightly. Indications: excessive fat in the blood   buPROPion XL (WELLBUTRIN XL) 150 mg tablet 11/08/2019 at Unknown time   Yes   Sig: Take 150 mg by mouth daily.  Indications: major depressive disorder, Patient reports last dose x 4 days ago.   diphenhydrAMINE-zinc acetate 2%-0.1% (BENADRYL) 2-0.1 % topical cream    Yes   Sig: Apply  to affected area two (2) times daily as needed for Itching.   eucalypt/men/camph/turp/wh.pet (MEDICATED CHEST RUB EX)    Yes   Sig: by Apply Externally route as needed for Cough.   ferrous sulfate 325 mg (65 mg iron) tablet    Yes   Sig: Take  by mouth Daily (before breakfast). Indications: anemia from inadequate iron   ginkgo biloba 120 mg tab    Yes   Sig: Take 1 Tab by mouth. Indications: herbal supplement   green tea leaf extract (GREEN TEA PO)    Yes   Sig: Take 500 mg by mouth. Indications: herbal supplement   guaiFENesin (ORGANIDIN) 400 mg tablet    Yes   Sig: Take  by mouth every four (4) hours as needed for Congestion.   hydrALAZINE (APRESOLINE) 50 mg tablet 10/15/2019 at Unknown time   Yes   Sig: Take 50 mg by mouth two (2) times a day. Indications: high blood pressure, Patient reports last dose x 2 weeks ago.   hydrOXYzine HCL (ATARAX) 50 mg tablet 11/08/2019 at Unknown time   Yes   Sig: Take 50 mg by mouth two (2) times a day. Indications: anxious, Patient reports last dose x 4 days ago   hydrocortisone (CORTAID) 1 % topical cream    Yes   Sig: Apply  to affected area two (2) times daily as needed for Skin Irritation. use thin layer   Indications: inflammation of the skin due to an allergy   lidocaine (XYLOCAINE) 4 % topical cream    Yes   Sig: Apply  to affected area two (2) times daily as needed for Pain. Indications: skin irritation   losartan-hydroCHLOROthiazide (HYZAAR) 100-25 mg per tablet 10/15/2019 at Unknown time   Yes   Sig: Take 1 Tab by mouth daily. Indications: high blood pressure, Patient reports last dose x 2 weeks ago   methyl salicylate/menthol (BEN GAY EX)    Yes   Sig: by Apply Externally route as needed.   miconazole nitrate 2 % aerp    Yes   Sig: by Apply Externally route as needed (athlete's foot). Indications:  athlete's foot   naproxen (NAPROSYN) 500 mg tablet 10/15/2019 at Unknown time   Yes   Sig: Take 500 mg by mouth two (2) times daily as needed for Pain. Indications: pain   polyethylene glycol (Miralax) 17 gram/dose powder 11/15/2019 at Unknown time  Yes   Sig: Take 17 g by mouth daily. Indications: constipation   pregabalin (LYRICA) 50 mg capsule 10/15/2019 at Unknown time   Yes   Sig: Take 50 mg by mouth two (2) times a day. Indications: neuropathic pain   trolamine salicylate (Arthricream) 10 % topical cream    Yes   Sig: Apply  to affected area as needed for Stiffness.            Thank you,  Corazon G Goss, [facility]

## 2019-11-15 NOTE — Behavioral Health Treatment Team (Signed)
PSYCHOSOCIAL ASSESSMENT  :Patient identifying info:   Julian [name] is a 59 y.o., male admitted 11/15/2019 12:24 AM     Presenting problem and precipitating factors: Pt admitted to hospital for SI with plan to run into traffic. Pt stated the trigger for his suicidality and worsening depression is the holidays and feeling like his family does not care about him. Pt stopped taking psychiatric medications a week ago due to "feeling too upset." Pt states he is not eating or sleeping well. Pt had a hx of self-harm via cutting, GAD and MDD. Pt confirms SI upon assessment but states he feels safe in hospital. Pt denies HI AH/VH upon assessment.       Mental status assessment: Pt seen in treatment team room with MD. Pt presented with full affect, depressed mood, slurred speech, fair insight and judgement, calm and cooperative, oriented x4.     Strengths: supportive income    Collateral information: no ROI signed     Current psychiatric /substance abuse providers and contact info: Dr. Glenice Bow- psychiatrist, no case management/therepuetic support    Previous psychiatric/substance abuse providers and response to treatment:  Previous psych hospitalizations    Family history of mental illness or substance abuse: Pt denies    Substance abuse history:  UDS-, BAL <10  Pt denies substance use   Social History     Tobacco Use   . Smoking status: Never Smoker   . Smokeless tobacco: Never Used   Substance Use Topics   . Alcohol use: No     Frequency: Never       History of biomedical complications associated with substance abuse : N/A    Patient's current acceptance of treatment or motivation for change: self-admission    Family constellation: not married, no children    Is significant other involved? No      Describe support system: friends, pt denies family support     Describe living arrangements and home environment: homeless, pt stays with different friends nightly    Health issues: HTN, HLD  Hospital Problems  Never Reviewed           Codes Class Noted POA    Major depressive disorder ICD-10-CM: F32.9  ICD-9-CM: 296.20  11/15/2019 Unknown              Trauma history: None reported    Legal issues: Pt denies     History of military service: None    Financial status: disability     Religious/cultural factors: None reported    Education/work history: 12th grade is highest level of education, pt previously worked    Have you been licensed as a Clinical cytogeneticist (current or expired):  No    Leisure and recreation preferences: reading books, listening to music     Describe coping skills: limited and ineffectual    Orma Flaming  11/15/2019

## 2019-11-15 NOTE — Progress Notes (Signed)
Problem: Suicide  Goal: *STG: Remains safe in hospital  Outcome: Progressing Towards Goal     Problem: Depressed Mood (Adult/Pediatric)  Goal: *STG: Participates in treatment plan  Outcome: Progressing Towards Goal

## 2019-11-15 NOTE — Progress Notes (Signed)
 Problem: Suicide  Goal: *STG: Remains safe in hospital  Outcome: Progressing Towards Goal  Goal: *STG/LTG: Complies with medication therapy  Outcome: Progressing Towards Goal

## 2019-11-15 NOTE — H&P (Signed)
INITIAL PSYCHIATRIC EVALUATION            IDENTIFICATION:    Patient Name  Julian [name]   Date of Birth [birth year]   CSN [id number]   Medical Record Number  [id number]      Age  59 y.o.   PCP Other, Phys, MD   Admit date:  11/15/2019    Room Number  956/21  @ [facility]   Date of Service  11/15/2019            HISTORY         REASON FOR HOSPITALIZATION:  CC: "suicidal ideation". Pt admitted under a voluntary basis for suicidal ideations proving to be an imminent danger to self and an inability to care for self.    HISTORY OF PRESENT ILLNESS:    The patient, Julian [name], is a 59 y.o.  BLACK/[place] male with a past psychiatric history significant for MDD, who presents at this time with complaints of (and/or evidence of) the following emotional symptoms: depression and suicidal thoughts/threats.  Additional symptomatology include family stressors.  The above symptoms have been present for 2+ days. These symptoms are of moderate to high severity. These symptoms are intermittent/ fleeting in nature.  The patient's condition has been precipitated by psychosocial stressors.  Patient's condition made worse by treatment noncompliance. UDS: negative; BAL=0.    Per admission documentation, patient reported a desire to run into traffic leading to the current presentation he states that a family member urged him to come in. He reports conditional suicidality and denies prior hospitalizations.    The patient is an unreliable historian. The patient corroborates the above narrative. The patient contracts for safety on the unit and gives consent for the team to contact collateral. The patient is amenable to initiating treatment while on the unit. The patient states that he stopped taking his Wellbutrin due to frustration with his family, he is evasive about the circumstances preceding this situation. The patient reports he lives alone but per SW assessment has been staying on couches.     ALLERGIES:    Allergies   Allergen Reactions   ??? Ace Inhibitors Cough      MEDICATIONS PRIOR TO ADMISSION:   Medications Prior to Admission   Medication Sig   ??? buPROPion XL (WELLBUTRIN XL) 150 mg tablet Take 150 mg by mouth daily. Indications: major depressive disorder, Patient reports last dose x 4 days ago.   ??? hydrOXYzine HCL (ATARAX) 50 mg tablet Take 50 mg by mouth two (2) times a day. Indications: anxious, Patient reports last dose x 4 days ago   ??? naproxen (NAPROSYN) 500 mg tablet Take 500 mg by mouth two (2) times daily as needed for Pain. Indications: pain   ??? hydrALAZINE (APRESOLINE) 50 mg tablet Take 50 mg by mouth two (2) times a day. Indications: high blood pressure, Patient reports last dose x 2 weeks ago.   ??? losartan-hydroCHLOROthiazide (HYZAAR) 100-25 mg per tablet Take 1 Tab by mouth daily. Indications: high blood pressure, Patient reports last dose x 2 weeks ago   ??? pregabalin (LYRICA) 50 mg capsule Take 50 mg by mouth two (2) times a day. Indications: neuropathic pain   ??? polyethylene glycol (Miralax) 17 gram/dose powder Take 17 g by mouth daily. Indications: constipation   ??? atorvastatin (LIPITOR) 40 mg tablet Take 40 mg by mouth nightly. Indications: excessive fat in the blood   ??? hydrocortisone (CORTAID) 1 % topical cream Apply  to affected  area two (2) times daily as needed for Skin Irritation. use thin layer   Indications: inflammation of the skin due to an allergy   ??? lidocaine (XYLOCAINE) 4 % topical cream Apply  to affected area two (2) times daily as needed for Pain. Indications: skin irritation   ??? green tea leaf extract (GREEN TEA PO) Take 500 mg by mouth. Indications: herbal supplement   ??? B-complex with vitamin C (VITAMIN B COMPLEX-C PO) Take 1 Tab by mouth. Indications: vitamin B deficiency   ??? Panax ginseng root (GINSENG KOREAN PO) Take 100 mg by mouth.   ??? eucalypt/men/camph/turp/wh.pet (MEDICATED CHEST RUB EX) by Apply Externally route as needed for Cough.   ??? diphenhydrAMINE-zinc acetate  2%-0.1% (BENADRYL) 2-0.1 % topical cream Apply  to affected area two (2) times daily as needed for Itching.   ??? guaiFENesin (ORGANIDIN) 400 mg tablet Take  by mouth every four (4) hours as needed for Congestion.   ??? ferrous sulfate 325 mg (65 mg iron) tablet Take  by mouth Daily (before breakfast). Indications: anemia from inadequate iron   ??? miconazole nitrate 2 % aerp by Apply Externally route as needed (athlete's foot). Indications: athlete's foot   ??? ginkgo biloba 120 mg tab Take 1 Tab by mouth. Indications: herbal supplement   ??? trolamine salicylate (Arthricream) 10 % topical cream Apply  to affected area as needed for Stiffness.   ??? methyl salicylate/menthol (BEN GAY EX) by Apply Externally route as needed.   ??? acetaminophen (TYLENOL) 500 mg tablet Take 1 Tab by mouth every six (6) hours as needed for Pain. (Patient taking differently: Take 500 mg by mouth every six (6) hours as needed for Pain. Indications: pain)      PAST MEDICAL HISTORY:   Past Medical History:   Diagnosis Date   ??? Anxiety    ??? Chronic constipation    ??? Diabetes ([place])    ??? Hypertension    ??? Major depression    ??? OCD (obsessive compulsive disorder)    History reviewed. No pertinent surgical history.   SOCIAL HISTORY:  The patient is currently on disability; the patient is not a smoker; the patient's marital status is single; the patient does not have children; the patient reports the highest level of education achieved is high school.      FAMILY HISTORY: History reviewed, pertinent family history as below:   History reviewed. No pertinent family history.    REVIEW OF SYSTEMS:   Pertinent items are noted in the History of Present Illness.  All other Systems reviewed and are considered negative.           MENTAL STATUS EXAM & VITALS     MENTAL STATUS EXAM (MSE):    MSE FINDINGS ARE WITHIN NORMAL LIMITS (WNL) UNLESS OTHERWISE STATED BELOW. ( ALL OF THE BELOW CATEGORIES OF THE MSE HAVE BEEN REVIEWED (reviewed 11/15/2019) AND UPDATED AS DEEMED  APPROPRIATE )  General Presentation age appropriate, cooperative and evasive   Orientation oriented to time, place and person   Vital Signs  See below (reviewed 11/15/2019); Vital Signs (BP, Pulse, & Temp) are within normal limits if not listed below.   Gait and Station Stable/steady, no ataxia   Musculoskeletal System No extrapyramidal symptoms (EPS); no abnormal muscular movements or Tardive Dyskinesia (TD); muscle strength and tone are within normal limits   Language No aphasia or dysarthria   Speech:  normal volume and non-pressured   Thought Processes coherent; normal rate of thoughts; fair abstract reasoning/computation  Thought Associations goal directed   Thought Content preoccupations   Suicidal Ideations contracts for safety   Homicidal Ideations none   Mood:  depressed   Affect:  full range and mood-incongruent   Memory recent  intact   Memory remote:  intact   Concentration/Attention:  intact   Fund of Knowledge average   Insight:  limited   Reliability poor   Judgment:  poor          VITALS:     Patient Vitals for the past 24 hrs:   Temp Pulse Resp BP SpO2   11/15/19 0813 97.6 ??F (36.4 ??C) 84 18 139/84 96 %   11/15/19 0457 ??? ??? ??? (!) 168/106 ???   11/15/19 0455 98.7 ??F (37.1 ??C) (!) 57 ??? (!) 169/102 100 %   11/15/19 0306 97.5 ??F (36.4 ??C) 77 ??? (!) 148/89 99 %   11/15/19 0224 ??? ??? ??? (!) 168/109 ???   11/15/19 0223 ??? ??? ??? (!) 168/109 ???   11/15/19 0030 98 ??F (36.7 ??C) 84 18 (!) 181/111 100 %     Wt Readings from Last 3 Encounters:   11/15/19 90.7 kg (200 lb)     Temp Readings from Last 3 Encounters:   11/15/19 97.6 ??F (36.4 ??C)   07/01/17 97.5 ??F (36.4 ??C)     BP Readings from Last 3 Encounters:   11/15/19 139/84   07/01/17 (!) 168/109     Pulse Readings from Last 3 Encounters:   11/15/19 84   07/01/17 78            DATA     LABORATORY DATA:  Labs Reviewed   CBC WITH AUTOMATED DIFF - Abnormal; Notable for the following components:       Result Value    IMMATURE GRANULOCYTES 1 (*)     All other components within  normal limits   METABOLIC PANEL, COMPREHENSIVE - Abnormal; Notable for the following components:    Glucose 124 (*)     Creatinine 1.32 (*)     GFR est non-AA 56 (*)     AST (SGOT) 50 (*)     A-G Ratio 0.9 (*)     All other components within normal limits   DRUG SCREEN, URINE   URINALYSIS W/ REFLEX CULTURE   ETHYL ALCOHOL   SARS-COV-2   COVID-19 WITH INFLUENZA A/B     Admission on 11/15/2019   Component Date Value Ref Range Status   ??? AMPHETAMINES 11/15/2019 Negative  NEG   Final   ??? BARBITURATES 11/15/2019 Negative  NEG   Final   ??? BENZODIAZEPINES 11/15/2019 Negative  NEG   Final   ??? COCAINE 11/15/2019 Negative  NEG   Final   ??? METHADONE 11/15/2019 Negative  NEG   Final   ??? OPIATES 11/15/2019 Negative  NEG   Final   ??? PCP(PHENCYCLIDINE) 11/15/2019 Negative  NEG   Final   ??? THC (TH-CANNABINOL) 11/15/2019 Negative  NEG   Final   ??? Drug screen comment 11/15/2019 (NOTE)   Final   ??? Color 11/15/2019 YELLOW/STRAW    Final   ??? Appearance 11/15/2019 CLEAR  CLEAR   Final   ??? Specific gravity 11/15/2019 1.010  1.003 - 1.030   Final   ??? pH (UA) 11/15/2019 7.0  5.0 - 8.0   Final   ??? Protein 11/15/2019 Negative  NEG mg/dL Final   ??? Glucose 11/15/2019 Negative  NEG mg/dL Final   ??? Ketone 11/15/2019 Negative  NEG mg/dL  Final   ??? Bilirubin 11/15/2019 Negative  NEG   Final   ??? Blood 11/15/2019 Negative  NEG   Final   ??? Urobilinogen 11/15/2019 1.0  0.2 - 1.0 EU/dL Final   ??? Nitrites 11/15/2019 Negative  NEG   Final   ??? Leukocyte Esterase 11/15/2019 Negative  NEG   Final   ??? WBC 11/15/2019 0-4  0 - 4 /hpf Final   ??? RBC 11/15/2019 0-5  0 - 5 /hpf Final   ??? Epithelial cells 11/15/2019 FEW  FEW /lpf Final   ??? Bacteria 11/15/2019 Negative  NEG /hpf Final   ??? UA:UC IF INDICATED 11/15/2019 CULTURE NOT INDICATED BY UA RESULT  CNI   Final   ??? ALCOHOL(ETHYL),SERUM 11/15/2019 <10  <10 MG/DL Final   ??? WBC 11/15/2019 7.2  4.1 - 11.1 K/uL Final   ??? RBC 11/15/2019 4.37  4.10 - 5.70 M/uL Final   ??? HGB 11/15/2019 12.5  12.1 - 17.0 g/dL Final   ??? HCT  11/15/2019 37.0  36.6 - 50.3 % Final   ??? MCV 11/15/2019 84.7  80.0 - 99.0 FL Final   ??? MCH 11/15/2019 28.6  26.0 - 34.0 PG Final   ??? MCHC 11/15/2019 33.8  30.0 - 36.5 g/dL Final   ??? RDW 11/15/2019 13.3  11.5 - 14.5 % Final   ??? PLATELET 11/15/2019 248  150 - 400 K/uL Final   ??? MPV 11/15/2019 10.3  8.9 - 12.9 FL Final   ??? NRBC 11/15/2019 0.0  0 PER 100 WBC Final   ??? ABSOLUTE NRBC 11/15/2019 0.00  0.00 - 0.01 K/uL Final   ??? NEUTROPHILS 11/15/2019 55  32 - 75 % Final   ??? LYMPHOCYTES 11/15/2019 29  12 - 49 % Final   ??? MONOCYTES 11/15/2019 9  5 - 13 % Final   ??? EOSINOPHILS 11/15/2019 5  0 - 7 % Final   ??? BASOPHILS 11/15/2019 1  0 - 1 % Final   ??? IMMATURE GRANULOCYTES 11/15/2019 1* 0.0 - 0.5 % Final   ??? ABS. NEUTROPHILS 11/15/2019 4.0  1.8 - 8.0 K/UL Final   ??? ABS. LYMPHOCYTES 11/15/2019 2.1  0.8 - 3.5 K/UL Final   ??? ABS. MONOCYTES 11/15/2019 0.7  0.0 - 1.0 K/UL Final   ??? ABS. EOSINOPHILS 11/15/2019 0.3  0.0 - 0.4 K/UL Final   ??? ABS. BASOPHILS 11/15/2019 0.1  0.0 - 0.1 K/UL Final   ??? ABS. IMM. GRANS. 11/15/2019 0.0  0.00 - 0.04 K/UL Final   ??? DF 11/15/2019 AUTOMATED    Final   ??? Sodium 11/15/2019 141  136 - 145 mmol/L Final   ??? Potassium 11/15/2019 4.3  3.5 - 5.1 mmol/L Final   ??? Chloride 11/15/2019 103  97 - 108 mmol/L Final   ??? CO2 11/15/2019 29  21 - 32 mmol/L Final   ??? Anion gap 11/15/2019 9  5 - 15 mmol/L Final   ??? Glucose 11/15/2019 124* 65 - 100 mg/dL Final   ??? BUN 11/15/2019 16  6 - 20 MG/DL Final   ??? Creatinine 11/15/2019 1.32* 0.70 - 1.30 MG/DL Final   ??? BUN/Creatinine ratio 11/15/2019 12  12 - 20   Final   ??? GFR est AA 11/15/2019 >60  >60 ml/min/1.52m Final   ??? GFR est non-AA 11/15/2019 56* >60 ml/min/1.770mFinal   ??? Calcium 11/15/2019 9.0  8.5 - 10.1 MG/DL Final   ??? Bilirubin, total 11/15/2019 0.3  0.2 - 1.0 MG/DL Final   ??? ALT (SGPT)  11/15/2019 73  12 - 78 U/L Final   ??? AST (SGOT) 11/15/2019 50* 15 - 37 U/L Final   ??? Alk. phosphatase 11/15/2019 71  45 - 117 U/L Final   ??? Protein, total 11/15/2019 7.2  6.4 -  8.2 g/dL Final   ??? Albumin 11/15/2019 3.5  3.5 - 5.0 g/dL Final   ??? Globulin 11/15/2019 3.7  2.0 - 4.0 g/dL Final   ??? A-G Ratio 11/15/2019 0.9* 1.1 - 2.2   Final   ??? SARS-CoV-2 11/15/2019 Please find results under separate order    Final   ??? SARS-CoV-2 11/15/2019 Not detected  NOTD   Final   ??? Influenza A by PCR 11/15/2019 Not detected  NOTD   Final   ??? Influenza B by PCR 11/15/2019 Not detected  NOTD   Final        RADIOLOGY REPORTS:  No results found for this or any previous visit.No results found.           MEDICATIONS       ALL MEDICATIONS  Current Facility-Administered Medications   Medication Dose Route Frequency   ??? OLANZapine (ZyPREXA) tablet 5 mg  5 mg Oral Q6H PRN   ??? haloperidol lactate (HALDOL) injection 5 mg  5 mg IntraMUSCular Q6H PRN   ??? benztropine (COGENTIN) tablet 1 mg  1 mg Oral BID PRN   ??? diphenhydrAMINE (BENADRYL) injection 50 mg  50 mg IntraMUSCular BID PRN   ??? hydrOXYzine HCL (ATARAX) tablet 50 mg  50 mg Oral TID PRN   ??? LORazepam (ATIVAN) injection 1 mg  1 mg IntraMUSCular Q4H PRN   ??? traZODone (DESYREL) tablet 50 mg  50 mg Oral QHS PRN   ??? magnesium hydroxide (MILK OF MAGNESIA) 400 mg/5 mL oral suspension 30 mL  30 mL Oral DAILY PRN   ??? hydrALAZINE (APRESOLINE) tablet 50 mg  50 mg Oral BID   ??? [START ON 11/16/2019] polyethylene glycol (MIRALAX) packet 17 g  17 g Oral DAILY   ??? naproxen (NAPROSYN) tablet 500 mg  500 mg Oral Q8H PRN   ??? pregabalin (LYRICA) capsule 100 mg  100 mg Oral DAILY   ??? buPROPion XL (WELLBUTRIN XL) tablet 150 mg  150 mg Oral QHS   ??? acetaminophen (TYLENOL) tablet 650 mg  650 mg Oral Q4H PRN   ??? [START ON 11/16/2019] losartan/hydroCHLOROthiazide (HYZAAR) 100/25 mg   Oral DAILY   ??? atorvastatin (LIPITOR) tablet 40 mg  40 mg Oral QHS      SCHEDULED MEDICATIONS  Current Facility-Administered Medications   Medication Dose Route Frequency   ??? hydrALAZINE (APRESOLINE) tablet 50 mg  50 mg Oral BID   ??? [START ON 11/16/2019] polyethylene glycol (MIRALAX) packet 17 g  17 g Oral DAILY    ??? pregabalin (LYRICA) capsule 100 mg  100 mg Oral DAILY   ??? buPROPion XL (WELLBUTRIN XL) tablet 150 mg  150 mg Oral QHS   ??? [START ON 11/16/2019] losartan/hydroCHLOROthiazide (HYZAAR) 100/25 mg   Oral DAILY   ??? atorvastatin (LIPITOR) tablet 40 mg  40 mg Oral QHS                ASSESSMENT & PLAN        The patient, JORDON KRISTIANSEN, is a 59 y.o.  male who presents at this time for treatment of the following diagnoses:  Patient Active Hospital Problem List:   Major depressive disorder (11/15/2019)    Assessment: patient with ongoing family stressors leading to hospitalization. No  substances on board and patient denies prior episodes. He states that he went off antidepressant and is now feeling suicidal. Unclear housing situation and secondary gain may be a factor. Will work with patient to secure safe disposition plan.    Plan:  - RESTART Wellbutrin XL 150 mg QDAY for MDD  - IGM therapy as tolerated  - Expand database / obtain collateral  - Dispo planning           A coordinated, multidisplinary treatment team (includes the nurse, unit pharmacist, Catering manager) round was conducted for this initial evaluation with the patient present.     The following regarding medications was addressed during rounds with patient: the risks and benefits of the proposed medication. The patient was given the opportunity to ask questions. Informed consent given to the use of the above medications.    I will continue to adjust psychiatric and non-psychiatric medications (see above "medication" section and orders section for details) as deemed appropriate & based upon diagnoses and response to treatment. I have reviewed admission (and previous/old) labs and medical tests in the EHR and or transferring hospital documents. I will continue to order blood tests/labs and diagnostic tests as deemed appropriate and review results as they become available (see orders for details). I have reviewed old psychiatric and medical records  available in the EHR. I Will order additional psychiatric records from other institutions to further elucidate the nature of patient's psychopathology and review once available.    I will gather additional collateral information from friends, family and o/p treatment team to further elucidate the nature of patient's psychopathology and baselline level of psychiatric functioning.    I certify that this patient's inpatient psychiatric hospital services are required for treatment that could reasonably be expected to improve the patient's condition, or for diagnostic study, and that the patient continues to need, on a daily basis, active treatment furnished directly by or requiring the supervision of inpatient psychiatric facility personnel. In addition, the hospital records show that services furnished were intensive treatment services, admission or related services, or equivalent services.      ESTIMATED LENGTH OF STAY:  2-3 days       STRENGTHS:  Exercising self-direction/Resourceful, Interpersonal/supportive relationships (family, friends, peers) and Knowledge of medications                                        SIGNED:    Raeford Razor, MD  11/15/2019

## 2019-11-15 NOTE — ED Notes (Signed)
 TRANSFER - OUT REPORT:    Verbal report given to [place], RN on Julian [name]  being transferred to [facility] for routine progression of care       Report consisted of patient's Situation, Background, Assessment and   Recommendations(SBAR).     Information from the following report(s) SBAR, ED Summary, Intake/Output, MAR, Recent Results and Med Rec Status was reviewed with the receiving nurse.    Lines:       Opportunity for questions and clarification was provided.      Patient transported with: Security

## 2019-11-15 NOTE — ED Notes (Signed)
Called security for transport to [employer].

## 2019-11-15 NOTE — Progress Notes (Signed)
Problem: Discharge Planning  Goal: *Discharge to safe environment  Outcome: Not Progressing Towards Goal  Note: Patient is homeless. Patient does not identify an alternative discharge option.  Goal: *Knowledge of medication management  Outcome: Progressing Towards Goal  Note: Patient verbalizes understanding of medication regimen. Patient is taking medications as prescribed.  Goal: *Knowledge of discharge instructions  Outcome: Progressing Towards Goal  Note: Patient verbalizes goals for treatment and safe discharge.

## 2019-11-15 NOTE — ED Notes (Signed)
Patient ambulated independently to restroom and provided urine sample.

## 2019-11-15 NOTE — Behavioral Health Treatment Team (Signed)
Patient arrived on the unit from [facility] ED on a voluntary admission with the chief complains of suicidal ideation and medicine non compliance. Patient was found by  EMS and he told them that he would run into the traffic.     Patient was cooperative with the admission process, somewhat interactive but had an odd demeanor. He expressed suicidal ideation with the plan to run into traffic during admission process and denied HI/A/V/H. Patient was a high risk for suicide during CSSR assessment.  Per protocol, patient was placed on 1:1, then discontinued after talking to the provider.    Blood pressure was elevated. One of his blood pressure medication has been ordered, rest will be ordered after review of medication list.    Patient was put on hospital gown. Belongings and valuables secured. UDS was negative and BAL >10. Will continue to monitor and provide support as needed.

## 2019-11-16 MED ORDER — PREGABALIN 25 MG CAP
25 mg | Freq: Every day | ORAL | Status: DC | PRN
Start: 2019-11-16 — End: 2019-11-21

## 2019-11-16 MED FILL — HEALTHYLAX 17 GRAM ORAL POWDER PACKET: 17 gram | ORAL | Qty: 1

## 2019-11-16 MED FILL — LOSARTAN 50 MG TAB: 50 mg | ORAL | Qty: 2

## 2019-11-16 MED FILL — BUPROPION XL 150 MG 24 HR TAB: 150 mg | ORAL | Qty: 1

## 2019-11-16 MED FILL — HYDRALAZINE 50 MG TAB: 50 mg | ORAL | Qty: 1

## 2019-11-16 MED FILL — ATORVASTATIN 40 MG TAB: 40 mg | ORAL | Qty: 1

## 2019-11-16 NOTE — Behavioral Health Treatment Team (Signed)
GROUP THERAPY PROGRESS NOTE    Patient did not participate in Coping Skills group.     Caroline Lavoie, MSW

## 2019-11-16 NOTE — Progress Notes (Signed)
Problem: Suicide  Goal: *STG/LTG: Complies with medication therapy  Outcome: Progressing Towards Goal

## 2019-11-16 NOTE — Behavioral Health Treatment Team (Signed)
0700-0730 Report received from Shreya,RN.    0758 Patient up in his room. Patient appears agitated. Patient reports he did not sleep good last night. Asked patient how he was doing today and he stated, "not good, having problems with my roommate." Patient would not discuss it further with this nurse. AAOX4. Speech clear. Resp. Even and unlabored. NAD noted. Skin WNL for race. Ambulates with steady gait without the use of assistive devices. Patient denies HI, AVH, and pain. Patient reports he remains SI but safe while in the hospital and his plan is for outside the hospital. Patient reports anxiety is 2 to 3 and depression is a 6 or 7 at this time. No other needs, wants, or concerns voiced at this time. Will continue to monitor.     0932 Patient sitting in the dayroom coloring. NAD noted.     1030 Patient sitting in the dayroom coloring. NAD noted.     1130 Patient sitting in the dayroom coloring. NAD noted.     1235 Patient sitting in the dayroom eating. NAD noted.     1454 patient sitting in the dayroom coloring. NAD noted.     1603 Patient up in his room getting ready to shower. NAD noted.     1801 Patient sitting in the dayroom coloring. NAD noted.

## 2019-11-16 NOTE — Behavioral Health Treatment Team (Signed)
PSYCHIATRIC PROGRESS NOTE         Patient Name  Julian [name]   Date of Birth 25-Jul-1961   CSN [id number]   Medical Record Number  [id number]      Age  59 y.o.   PCP Other, Phys, MD   Admit date:  11/15/2019    Room Number  325/01  @ [facility]   Date of Service  11/16/2019         E & M PROGRESS NOTE:         HISTORY       CC:  "suicidal ideation"  HISTORY OF PRESENT ILLNESS/INTERVAL HISTORY:  (reviewed/updated 11/16/2019).  per initial evaluation: The patient, Julian [name], is a 59 y.o.  BLACK/[place] male with a past psychiatric history significant for MDD, who presents at this time with complaints of (and/or evidence of) the following emotional symptoms: depression and suicidal thoughts/threats.  Additional symptomatology include family stressors.  The above symptoms have been present for 2+ days. These symptoms are of moderate to high severity. These symptoms are intermittent/ fleeting in nature.  The patient's condition has been precipitated by psychosocial stressors.  Patient's condition made worse by treatment noncompliance. UDS: negative; BAL=0.  ??  Per admission documentation, patient reported a desire to run into traffic leading to the current presentation he states that a family member urged him to come in. He reports conditional suicidality and denies prior hospitalizations.  ??  The patient is an unreliable historian. The patient corroborates the above narrative. The patient contracts for safety on the unit and gives consent for the team to contact collateral. The patient is amenable to initiating treatment while on the unit. The patient states that he stopped taking his Wellbutrin due to frustration with his family, he is evasive about the circumstances preceding this situation. The patient reports he lives alone but per SW assessment has been staying on couches.    4/04 - the patient slept 7.5 hours, remains irritable but calm and cooperative. Patient had a disagreement with his  roommate, and was moved to a different room where he is doing better. Patient denies active thoughts of self harm, but endorses passive SI and anxiety; depression persists. Patient medication compliant, able to make needs known.??        SIDE EFFECTS: (reviewed/updated 11/16/2019)  None reported or admitted to.     ALLERGIES:(reviewed/updated 11/16/2019)  Allergies   Allergen Reactions   ??? Ace Inhibitors Cough      MEDICATIONS PRIOR TO ADMISSION:(reviewed/updated 11/16/2019)  Medications Prior to Admission   Medication Sig   ??? buPROPion XL (WELLBUTRIN XL) 150 mg tablet Take 150 mg by mouth daily. Indications: major depressive disorder, Patient reports last dose x 4 days ago.   ??? hydrOXYzine HCL (ATARAX) 50 mg tablet Take 50 mg by mouth two (2) times a day. Indications: anxious, Patient reports last dose x 4 days ago   ??? naproxen (NAPROSYN) 500 mg tablet Take 500 mg by mouth two (2) times daily as needed for Pain. Indications: pain   ??? hydrALAZINE (APRESOLINE) 50 mg tablet Take 50 mg by mouth two (2) times a day. Indications: high blood pressure, Patient reports last dose x 2 weeks ago.   ??? losartan-hydroCHLOROthiazide (HYZAAR) 100-25 mg per tablet Take 1 Tab by mouth daily. Indications: high blood pressure, Patient reports last dose x 2 weeks ago   ??? pregabalin (LYRICA) 50 mg capsule Take 50 mg by mouth two (2) times a day. Indications:  neuropathic pain   ??? polyethylene glycol (Miralax) 17 gram/dose powder Take 17 g by mouth daily. Indications: constipation   ??? atorvastatin (LIPITOR) 40 mg tablet Take 40 mg by mouth nightly. Indications: excessive fat in the blood   ??? hydrocortisone (CORTAID) 1 % topical cream Apply  to affected area two (2) times daily as needed for Skin Irritation. use thin layer   Indications: inflammation of the skin due to an allergy   ??? lidocaine (XYLOCAINE) 4 % topical cream Apply  to affected area two (2) times daily as needed for Pain. Indications: skin irritation   ??? green tea leaf extract (GREEN  TEA PO) Take 500 mg by mouth. Indications: herbal supplement   ??? B-complex with vitamin C (VITAMIN B COMPLEX-C PO) Take 1 Tab by mouth. Indications: vitamin B deficiency   ??? Panax ginseng root (GINSENG KOREAN PO) Take 100 mg by mouth.   ??? eucalypt/men/camph/turp/wh.pet (MEDICATED CHEST RUB EX) by Apply Externally route as needed for Cough.   ??? diphenhydrAMINE-zinc acetate 2%-0.1% (BENADRYL) 2-0.1 % topical cream Apply  to affected area two (2) times daily as needed for Itching.   ??? guaiFENesin (ORGANIDIN) 400 mg tablet Take  by mouth every four (4) hours as needed for Congestion.   ??? ferrous sulfate 325 mg (65 mg iron) tablet Take  by mouth Daily (before breakfast). Indications: anemia from inadequate iron   ??? miconazole nitrate 2 % aerp by Apply Externally route as needed (athlete's foot). Indications: athlete's foot   ??? ginkgo biloba 120 mg tab Take 1 Tab by mouth. Indications: herbal supplement   ??? trolamine salicylate (Arthricream) 10 % topical cream Apply  to affected area as needed for Stiffness.   ??? methyl salicylate/menthol (BEN GAY EX) by Apply Externally route as needed.   ??? acetaminophen (TYLENOL) 500 mg tablet Take 1 Tab by mouth every six (6) hours as needed for Pain. (Patient taking differently: Take 500 mg by mouth every six (6) hours as needed for Pain. Indications: pain)      PAST MEDICAL HISTORY: Past medical history from the initial psychiatric evaluation has been reviewed (reviewed/updated 11/16/2019) with no additional updates (I asked patient and no additional past medical history provided). Past Medical History:   Diagnosis Date   ??? Anxiety    ??? Chronic constipation    ??? Diabetes ([place])    ??? Hypertension    ??? Major depression    ??? OCD (obsessive compulsive disorder)    History reviewed. No pertinent surgical history.   SOCIAL HISTORY: Social history from the initial psychiatric evaluation has been reviewed (reviewed/updated 11/16/2019) with no additional updates (I asked patient and no additional  social history provided). Social History     Socioeconomic History   ??? Marital status: SINGLE     Spouse name: Not on file   ??? Number of children: Not on file   ??? Years of education: Not on file   ??? Highest education level: Not on file   Occupational History   ??? Not on file   Social Needs   ??? Financial resource strain: Not on file   ??? Food insecurity     Worry: Not on file     Inability: Not on file   ??? Transportation needs     Medical: Not on file     Non-medical: Not on file   Tobacco Use   ??? Smoking status: Never Smoker   ??? Smokeless tobacco: Never Used   Substance and Sexual Activity   ???  Alcohol use: No     Frequency: Never   ??? Drug use: No   ??? Sexual activity: Not Currently   Lifestyle   ??? Physical activity     Days per week: Not on file     Minutes per session: Not on file   ??? Stress: Not on file   Relationships   ??? Social connections     Talks on phone: Not on file     Gets together: Not on file     Attends religious service: Not on file     Active member of club or organization: Not on file     Attends meetings of clubs or organizations: Not on file     Relationship status: Not on file   ??? Intimate partner violence     Fear of current or ex partner: Not on file     Emotionally abused: Not on file     Physically abused: Not on file     Forced sexual activity: Not on file   Other Topics Concern   ??? Not on file   Social History Narrative   ??? Not on file      FAMILY HISTORY: Family history from the initial psychiatric evaluation has been reviewed (reviewed/updated 11/16/2019) with no additional updates (I asked patient and no additional family history provided). History reviewed. No pertinent family history.    REVIEW OF SYSTEMS: (reviewed/updated 11/16/2019)  Appetite:improved   Sleep: no change   All other Review of Systems: Negative except per HPI         MENTAL STATUS EXAM & VITALS     MENTAL STATUS EXAM (MSE):    MSE FINDINGS ARE WITHIN NORMAL LIMITS (WNL) UNLESS OTHERWISE STATED BELOW. ( ALL OF THE BELOW  CATEGORIES OF THE MSE HAVE BEEN REVIEWED (reviewed 11/16/2019) AND UPDATED AS DEEMED APPROPRIATE )  General Presentation age appropriate, cooperative and guarded   Orientation oriented to time, place and person   Vital Signs  See below (reviewed 11/16/2019); Vital Signs (BP, Pulse, & Temp) are within normal limits if not listed below.   Gait and Station Stable/steady, no ataxia   Musculoskeletal System No extrapyramidal symptoms (EPS); no abnormal muscular movements or Tardive Dyskinesia (TD); muscle strength and tone are within normal limits   Language No aphasia or dysarthria   Speech:  normal volume and non-pressured   Thought Processes logical; normal rate of thoughts; fair abstract reasoning/computation   Thought Associations goal directed   Thought Content preoccupations   Suicidal Ideations contracts for safety   Homicidal Ideations none   Mood:  depressed   Affect:  euthymic and mood-incongruent   Memory recent  intact   Memory remote:  intact   Concentration/Attention:  intact   Fund of Knowledge average   Insight:  limited   Reliability poor   Judgment:  fair          VITALS:     Patient Vitals for the past 24 hrs:   Temp Pulse Resp BP SpO2   11/16/19 1539 98 ??F (36.7 ??C) 78 18 (!) 159/90 100 %   11/15/19 2218 ??? 83 ??? (!) 143/81 97 %   11/15/19 2100 98.2 ??F (36.8 ??C) 98 18 (!) 162/98 98 %     Wt Readings from Last 3 Encounters:   11/15/19 90.7 kg (200 lb)     Temp Readings from Last 3 Encounters:   11/16/19 98 ??F (36.7 ??C)   07/01/17 97.5 ??F (36.4 ??C)       BP Readings from Last 3 Encounters:   11/16/19 (!) 159/90   07/01/17 (!) 168/109     Pulse Readings from Last 3 Encounters:   11/16/19 78   07/01/17 78            DATA     LABORATORY DATA:(reviewed/updated 11/16/2019)  No results found for this or any previous visit (from the past 24 hour(s)).  No results found for: VALF2, VALAC, VALP, VALPR, DS6, CRBAM, CRBAMP, CARB2, XCRBAM  No results found for: LITHM   RADIOLOGY REPORTS:(reviewed/updated 11/16/2019)  No  results found.       MEDICATIONS     ALL MEDICATIONS:   Current Facility-Administered Medications   Medication Dose Route Frequency   ??? OLANZapine (ZyPREXA) tablet 5 mg  5 mg Oral Q6H PRN   ??? haloperidol lactate (HALDOL) injection 5 mg  5 mg IntraMUSCular Q6H PRN   ??? benztropine (COGENTIN) tablet 1 mg  1 mg Oral BID PRN   ??? diphenhydrAMINE (BENADRYL) injection 50 mg  50 mg IntraMUSCular BID PRN   ??? hydrOXYzine HCL (ATARAX) tablet 50 mg  50 mg Oral TID PRN   ??? LORazepam (ATIVAN) injection 1 mg  1 mg IntraMUSCular Q4H PRN   ??? traZODone (DESYREL) tablet 50 mg  50 mg Oral QHS PRN   ??? magnesium hydroxide (MILK OF MAGNESIA) 400 mg/5 mL oral suspension 30 mL  30 mL Oral DAILY PRN   ??? hydrALAZINE (APRESOLINE) tablet 50 mg  50 mg Oral BID   ??? polyethylene glycol (MIRALAX) packet 17 g  17 g Oral DAILY   ??? naproxen (NAPROSYN) tablet 500 mg  500 mg Oral Q8H PRN   ??? buPROPion XL (WELLBUTRIN XL) tablet 150 mg  150 mg Oral QHS   ??? acetaminophen (TYLENOL) tablet 650 mg  650 mg Oral Q4H PRN   ??? losartan/hydroCHLOROthiazide (HYZAAR) 100/25 mg   Oral DAILY   ??? atorvastatin (LIPITOR) tablet 40 mg  40 mg Oral QHS   ??? pregabalin (LYRICA) capsule 100 mg  100 mg Oral DAILY PRN      SCHEDULED MEDICATIONS:   Current Facility-Administered Medications   Medication Dose Route Frequency   ??? hydrALAZINE (APRESOLINE) tablet 50 mg  50 mg Oral BID   ??? polyethylene glycol (MIRALAX) packet 17 g  17 g Oral DAILY   ??? buPROPion XL (WELLBUTRIN XL) tablet 150 mg  150 mg Oral QHS   ??? losartan/hydroCHLOROthiazide (HYZAAR) 100/25 mg   Oral DAILY   ??? atorvastatin (LIPITOR) tablet 40 mg  40 mg Oral QHS          ASSESSMENT & PLAN     DIAGNOSES REQUIRING ACTIVE TREATMENT AND MONITORING: (reviewed/updated 11/16/2019)  Patient Active Hospital Problem List:   MDD (major depressive disorder), recurrent episode, moderate (HCC) (11/15/2019)    Assessment: patient with ongoing family stressors leading to hospitalization. No substances on board and patient denies prior  episodes. He states that he went off antidepressant and is now feeling suicidal. Unclear housing situation and secondary gain may be a factor. Will work with patient to secure safe disposition plan.    Plan:  - CONTINUE Wellbutrin XL 150 mg QDAY for MDD  - IGM therapy as tolerated  - Expand database / obtain collateral  - Dispo planning    In summary, Julian [name], is a 59 y.o.  male who presents with a severe exacerbation of the principal diagnosis of MDD (major depressive disorder), recurrent episode, moderate (HCC)    Patient's condition is improving.  Patient requires continued inpatient hospitalization for further stabilization, safety monitoring and medication management.  I will continue to coordinate the provision of individual, milieu, occupational, group, and substance abuse therapies to address target symptoms/diagnoses as deemed appropriate for the individual patient.  A coordinated, multidisplinary treatment team round was conducted with the patient (this team consists of the nurse, psychiatric unit pharmacist, Administrator).     Complete current electronic health record for patient has been reviewed today including consultant notes, ancillary staff notes, nurses and psychiatric tech notes.    Suicide risk assessment completed and patient deemed to be of low risk for suicide at this time.     The following regarding medications was addressed during rounds with patient:   the risks and benefits of the proposed medication. The patient was given the opportunity to ask questions. Informed consent given to the use of the above medications. Will continue to adjust psychiatric and non-psychiatric medications (see above "medication" section and orders section for details) as deemed appropriate & based upon diagnoses and response to treatment.     I will continue to order blood tests/labs and diagnostic tests as deemed appropriate and review results as they become available (see orders for details  and above listed lab/test results).    I will order psychiatric records from previous psych hospitals to further elucidate the nature of patient's psychopathology and review once available.    I will gather additional collateral information from friends, family and o/p treatment team to further elucidate the nature of patient's psychopathology and baselline level of psychiatric functioning.         I certify that this patient's inpatient psychiatric hospital services furnished since the previous certification were, and continue to be, required for treatment that could reasonably be expected to improve the patient's condition, or for diagnostic study, and that the patient continues to need, on a daily basis, active treatment furnished directly by or requiring the supervision of inpatient psychiatric facility personnel. In addition, the hospital records show that services furnished were intensive treatment services, admission or related services, or equivalent services.    EXPECTED DISCHARGE DATE/DAY: TBD     DISPOSITION: Home       Signed By:   Hiram Gash, MD  11/16/2019

## 2019-11-16 NOTE — Behavioral Health Treatment Team (Signed)
Assumed care of the patient. Patient was out in the milieu. He appeared pleasant and was cooperative with the assessments. Patient said that he still has suicidal ideation but at the same time agrees to come and ask for help. Patient denied HI/A/V/H and confirmed anxiety and depression.     Patient was medication and meal compliant. BP was elevated. Patient requested PRN medication for sleep. Administered Trazodone at 2121. Will continue to monitor and provide support as needed. Patient slept for about 6.5 hours.

## 2019-11-16 NOTE — Behavioral Health Treatment Team (Signed)
 [id number] Report received from [facility].    0758 Patient up in his room. Patient appears agitated. Patient reports he did not sleep good last night. Asked patient how he was doing today and he stated, not good, having problems with my roommate. Patient would not discuss it further with this nurse. AAOX4. Speech clear. Resp. Even and unlabored. NAD noted. Skin WNL for race. Ambulates with steady gait without the use of assistive devices. Patient denies HI, AVH, and pain. Patient reports he remains SI but safe while in the hospital and his plan is for outside the hospital. Patient reports anxiety is 2 to 3 and depression is a 6 or 7 at this time. No other needs, wants, or concerns voiced at this time. Will continue to monitor.     9067 Patient sitting in the dayroom coloring. NAD noted.     1030 Patient sitting in the dayroom coloring. NAD noted.     1130 Patient sitting in the dayroom coloring. NAD noted.     1235 Patient sitting in the dayroom eating. NAD noted.     1454 patient sitting in the dayroom coloring. NAD noted.     1603 Patient up in his room getting ready to shower. NAD noted.     1801 Patient sitting in the dayroom coloring. NAD noted.

## 2019-11-16 NOTE — Behavioral Health Treatment Team (Signed)
Assumed care of the patient. Patient was out in the milieu. He appeared pleasant and was cooperative with the assessments. Patient said that he still has suicidal ideation but at the same time agrees to come and ask for help. Patient denied HI/A/V/H and confirmed anxiety and depression.     Patient was medication and meal compliant. BP was elevated. Patient requested PRN medication for sleep. Administered Trazodone at 2121. Will continue to monitor and provide support as needed. Patient slept for about 6.5 hours.

## 2019-11-16 NOTE — Behavioral Health Treatment Team (Signed)
PSYCHIATRIC PROGRESS NOTE         Patient Name  Julian [name]   Date of Birth 25-Jul-1961   CSN [id number]   Medical Record Number  [id number]      Age  59 y.o.   PCP Other, Phys, MD   Admit date:  11/15/2019    Room Number  325/01  @ [facility]   Date of Service  11/16/2019         E & M PROGRESS NOTE:         HISTORY       CC:  "suicidal ideation"  HISTORY OF PRESENT ILLNESS/INTERVAL HISTORY:  (reviewed/updated 11/16/2019).  per initial evaluation: The patient, Julian [name], is a 59 y.o.  BLACK/[place] male with a past psychiatric history significant for MDD, who presents at this time with complaints of (and/or evidence of) the following emotional symptoms: depression and suicidal thoughts/threats.  Additional symptomatology include family stressors.  The above symptoms have been present for 2+ days. These symptoms are of moderate to high severity. These symptoms are intermittent/ fleeting in nature.  The patient's condition has been precipitated by psychosocial stressors.  Patient's condition made worse by treatment noncompliance. UDS: negative; BAL=0.  ??  Per admission documentation, patient reported a desire to run into traffic leading to the current presentation he states that a family member urged him to come in. He reports conditional suicidality and denies prior hospitalizations.  ??  The patient is an unreliable historian. The patient corroborates the above narrative. The patient contracts for safety on the unit and gives consent for the team to contact collateral. The patient is amenable to initiating treatment while on the unit. The patient states that he stopped taking his Wellbutrin due to frustration with his family, he is evasive about the circumstances preceding this situation. The patient reports he lives alone but per SW assessment has been staying on couches.    4/04 - the patient slept 7.5 hours, remains irritable but calm and cooperative. Patient had a disagreement with his  roommate, and was moved to a different room where he is doing better. Patient denies active thoughts of self harm, but endorses passive SI and anxiety; depression persists. Patient medication compliant, able to make needs known.??        SIDE EFFECTS: (reviewed/updated 11/16/2019)  None reported or admitted to.     ALLERGIES:(reviewed/updated 11/16/2019)  Allergies   Allergen Reactions   ??? Ace Inhibitors Cough      MEDICATIONS PRIOR TO ADMISSION:(reviewed/updated 11/16/2019)  Medications Prior to Admission   Medication Sig   ??? buPROPion XL (WELLBUTRIN XL) 150 mg tablet Take 150 mg by mouth daily. Indications: major depressive disorder, Patient reports last dose x 4 days ago.   ??? hydrOXYzine HCL (ATARAX) 50 mg tablet Take 50 mg by mouth two (2) times a day. Indications: anxious, Patient reports last dose x 4 days ago   ??? naproxen (NAPROSYN) 500 mg tablet Take 500 mg by mouth two (2) times daily as needed for Pain. Indications: pain   ??? hydrALAZINE (APRESOLINE) 50 mg tablet Take 50 mg by mouth two (2) times a day. Indications: high blood pressure, Patient reports last dose x 2 weeks ago.   ??? losartan-hydroCHLOROthiazide (HYZAAR) 100-25 mg per tablet Take 1 Tab by mouth daily. Indications: high blood pressure, Patient reports last dose x 2 weeks ago   ??? pregabalin (LYRICA) 50 mg capsule Take 50 mg by mouth two (2) times a day. Indications:  neuropathic pain   ??? polyethylene glycol (Miralax) 17 gram/dose powder Take 17 g by mouth daily. Indications: constipation   ??? atorvastatin (LIPITOR) 40 mg tablet Take 40 mg by mouth nightly. Indications: excessive fat in the blood   ??? hydrocortisone (CORTAID) 1 % topical cream Apply  to affected area two (2) times daily as needed for Skin Irritation. use thin layer   Indications: inflammation of the skin due to an allergy   ??? lidocaine (XYLOCAINE) 4 % topical cream Apply  to affected area two (2) times daily as needed for Pain. Indications: skin irritation   ??? green tea leaf extract (GREEN  TEA PO) Take 500 mg by mouth. Indications: herbal supplement   ??? B-complex with vitamin C (VITAMIN B COMPLEX-C PO) Take 1 Tab by mouth. Indications: vitamin B deficiency   ??? Panax ginseng root (GINSENG KOREAN PO) Take 100 mg by mouth.   ??? eucalypt/men/camph/turp/wh.pet (MEDICATED CHEST RUB EX) by Apply Externally route as needed for Cough.   ??? diphenhydrAMINE-zinc acetate 2%-0.1% (BENADRYL) 2-0.1 % topical cream Apply  to affected area two (2) times daily as needed for Itching.   ??? guaiFENesin (ORGANIDIN) 400 mg tablet Take  by mouth every four (4) hours as needed for Congestion.   ??? ferrous sulfate 325 mg (65 mg iron) tablet Take  by mouth Daily (before breakfast). Indications: anemia from inadequate iron   ??? miconazole nitrate 2 % aerp by Apply Externally route as needed (athlete's foot). Indications: athlete's foot   ??? ginkgo biloba 120 mg tab Take 1 Tab by mouth. Indications: herbal supplement   ??? trolamine salicylate (Arthricream) 10 % topical cream Apply  to affected area as needed for Stiffness.   ??? methyl salicylate/menthol (BEN GAY EX) by Apply Externally route as needed.   ??? acetaminophen (TYLENOL) 500 mg tablet Take 1 Tab by mouth every six (6) hours as needed for Pain. (Patient taking differently: Take 500 mg by mouth every six (6) hours as needed for Pain. Indications: pain)      PAST MEDICAL HISTORY: Past medical history from the initial psychiatric evaluation has been reviewed (reviewed/updated 11/16/2019) with no additional updates (I asked patient and no additional past medical history provided). Past Medical History:   Diagnosis Date   ??? Anxiety    ??? Chronic constipation    ??? Diabetes ([place])    ??? Hypertension    ??? Major depression    ??? OCD (obsessive compulsive disorder)    History reviewed. No pertinent surgical history.   SOCIAL HISTORY: Social history from the initial psychiatric evaluation has been reviewed (reviewed/updated 11/16/2019) with no additional updates (I asked patient and no additional  social history provided). Social History     Socioeconomic History   ??? Marital status: SINGLE     Spouse name: Not on file   ??? Number of children: Not on file   ??? Years of education: Not on file   ??? Highest education level: Not on file   Occupational History   ??? Not on file   Social Needs   ??? Financial resource strain: Not on file   ??? Food insecurity     Worry: Not on file     Inability: Not on file   ??? Transportation needs     Medical: Not on file     Non-medical: Not on file   Tobacco Use   ??? Smoking status: Never Smoker   ??? Smokeless tobacco: Never Used   Substance and Sexual Activity   ???  Alcohol use: No     Frequency: Never   ??? Drug use: No   ??? Sexual activity: Not Currently   Lifestyle   ??? Physical activity     Days per week: Not on file     Minutes per session: Not on file   ??? Stress: Not on file   Relationships   ??? Social Wellsite geologistconnections     Talks on phone: Not on file     Gets together: Not on file     Attends religious service: Not on file     Active member of club or organization: Not on file     Attends meetings of clubs or organizations: Not on file     Relationship status: Not on file   ??? Intimate partner violence     Fear of current or ex partner: Not on file     Emotionally abused: Not on file     Physically abused: Not on file     Forced sexual activity: Not on file   Other Topics Concern   ??? Not on file   Social History Narrative   ??? Not on file      FAMILY HISTORY: Family history from the initial psychiatric evaluation has been reviewed (reviewed/updated 11/16/2019) with no additional updates (I asked patient and no additional family history provided). History reviewed. No pertinent family history.    REVIEW OF SYSTEMS: (reviewed/updated 11/16/2019)  Appetite:improved   Sleep: no change   All other Review of Systems: Negative except per HPI         MENTAL STATUS EXAM & VITALS     MENTAL STATUS EXAM (MSE):    MSE FINDINGS ARE WITHIN NORMAL LIMITS (WNL) UNLESS OTHERWISE STATED BELOW. ( ALL OF THE BELOW  CATEGORIES OF THE MSE HAVE BEEN REVIEWED (reviewed 11/16/2019) AND UPDATED AS DEEMED APPROPRIATE )  General Presentation age appropriate, cooperative and guarded   Orientation oriented to time, place and person   Vital Signs  See below (reviewed 11/16/2019); Vital Signs (BP, Pulse, & Temp) are within normal limits if not listed below.   Gait and Station Stable/steady, no ataxia   Musculoskeletal System No extrapyramidal symptoms (EPS); no abnormal muscular movements or Tardive Dyskinesia (TD); muscle strength and tone are within normal limits   Language No aphasia or dysarthria   Speech:  normal volume and non-pressured   Thought Processes logical; normal rate of thoughts; fair abstract reasoning/computation   Thought Associations goal directed   Thought Content preoccupations   Suicidal Ideations contracts for safety   Homicidal Ideations none   Mood:  depressed   Affect:  euthymic and mood-incongruent   Memory recent  intact   Memory remote:  intact   Concentration/Attention:  intact   Fund of Knowledge average   Insight:  limited   Reliability poor   Judgment:  fair          VITALS:     Patient Vitals for the past 24 hrs:   Temp Pulse Resp BP SpO2   11/16/19 1539 98 ??F (36.7 ??C) 78 18 (!) 159/90 100 %   11/15/19 2218 ??? 83 ??? (!) 143/81 97 %   11/15/19 2100 98.2 ??F (36.8 ??C) 98 18 (!) 162/98 98 %     Wt Readings from Last 3 Encounters:   11/15/19 90.7 kg (200 lb)     Temp Readings from Last 3 Encounters:   11/16/19 98 ??F (36.7 ??C)   07/01/17 97.5 ??F (36.4 ??C)  BP Readings from Last 3 Encounters:   11/16/19 (!) 159/90   07/01/17 (!) 168/109     Pulse Readings from Last 3 Encounters:   11/16/19 78   07/01/17 78            DATA     LABORATORY DATA:(reviewed/updated 11/16/2019)  No results found for this or any previous visit (from the past 24 hour(s)).  No results found for: VALF2, VALAC, VALP, VALPR, DS6, CRBAM, CRBAMP, CARB2, XCRBAM  No results found for: LITHM   RADIOLOGY REPORTS:(reviewed/updated 11/16/2019)  No  results found.       MEDICATIONS     ALL MEDICATIONS:   Current Facility-Administered Medications   Medication Dose Route Frequency   ??? OLANZapine (ZyPREXA) tablet 5 mg  5 mg Oral Q6H PRN   ??? haloperidol lactate (HALDOL) injection 5 mg  5 mg IntraMUSCular Q6H PRN   ??? benztropine (COGENTIN) tablet 1 mg  1 mg Oral BID PRN   ??? diphenhydrAMINE (BENADRYL) injection 50 mg  50 mg IntraMUSCular BID PRN   ??? hydrOXYzine HCL (ATARAX) tablet 50 mg  50 mg Oral TID PRN   ??? LORazepam (ATIVAN) injection 1 mg  1 mg IntraMUSCular Q4H PRN   ??? traZODone (DESYREL) tablet 50 mg  50 mg Oral QHS PRN   ??? magnesium hydroxide (MILK OF MAGNESIA) 400 mg/5 mL oral suspension 30 mL  30 mL Oral DAILY PRN   ??? hydrALAZINE (APRESOLINE) tablet 50 mg  50 mg Oral BID   ??? polyethylene glycol (MIRALAX) packet 17 g  17 g Oral DAILY   ??? naproxen (NAPROSYN) tablet 500 mg  500 mg Oral Q8H PRN   ??? buPROPion XL (WELLBUTRIN XL) tablet 150 mg  150 mg Oral QHS   ??? acetaminophen (TYLENOL) tablet 650 mg  650 mg Oral Q4H PRN   ??? losartan/hydroCHLOROthiazide (HYZAAR) 100/25 mg   Oral DAILY   ??? atorvastatin (LIPITOR) tablet 40 mg  40 mg Oral QHS   ??? pregabalin (LYRICA) capsule 100 mg  100 mg Oral DAILY PRN      SCHEDULED MEDICATIONS:   Current Facility-Administered Medications   Medication Dose Route Frequency   ??? hydrALAZINE (APRESOLINE) tablet 50 mg  50 mg Oral BID   ??? polyethylene glycol (MIRALAX) packet 17 g  17 g Oral DAILY   ??? buPROPion XL (WELLBUTRIN XL) tablet 150 mg  150 mg Oral QHS   ??? losartan/hydroCHLOROthiazide (HYZAAR) 100/25 mg   Oral DAILY   ??? atorvastatin (LIPITOR) tablet 40 mg  40 mg Oral QHS          ASSESSMENT & PLAN     DIAGNOSES REQUIRING ACTIVE TREATMENT AND MONITORING: (reviewed/updated 11/16/2019)  Patient Active Hospital Problem List:   MDD (major depressive disorder), recurrent episode, moderate (HCC) (11/15/2019)    Assessment: patient with ongoing family stressors leading to hospitalization. No substances on board and patient denies prior  episodes. He states that he went off antidepressant and is now feeling suicidal. Unclear housing situation and secondary gain may be a factor. Will work with patient to secure safe disposition plan.    Plan:  - CONTINUE Wellbutrin XL 150 mg QDAY for MDD  - IGM therapy as tolerated  - Expand database / obtain collateral  - Dispo planning    In summary, Julian [name], is a 59 y.o.  male who presents with a severe exacerbation of the principal diagnosis of MDD (major depressive disorder), recurrent episode, moderate (HCC)    Patient's condition is improving.  Patient requires continued inpatient hospitalization for further stabilization, safety monitoring and medication management.  I will continue to coordinate the provision of individual, milieu, occupational, group, and substance abuse therapies to address target symptoms/diagnoses as deemed appropriate for the individual patient.  A coordinated, multidisplinary treatment team round was conducted with the patient (this team consists of the nurse, psychiatric unit pharmacist, Administrator).     Complete current electronic health record for patient has been reviewed today including consultant notes, ancillary staff notes, nurses and psychiatric tech notes.    Suicide risk assessment completed and patient deemed to be of low risk for suicide at this time.     The following regarding medications was addressed during rounds with patient:   the risks and benefits of the proposed medication. The patient was given the opportunity to ask questions. Informed consent given to the use of the above medications. Will continue to adjust psychiatric and non-psychiatric medications (see above "medication" section and orders section for details) as deemed appropriate & based upon diagnoses and response to treatment.     I will continue to order blood tests/labs and diagnostic tests as deemed appropriate and review results as they become available (see orders for details  and above listed lab/test results).    I will order psychiatric records from previous psych hospitals to further elucidate the nature of patient's psychopathology and review once available.    I will gather additional collateral information from friends, family and o/p treatment team to further elucidate the nature of patient's psychopathology and baselline level of psychiatric functioning.         I certify that this patient's inpatient psychiatric hospital services furnished since the previous certification were, and continue to be, required for treatment that could reasonably be expected to improve the patient's condition, or for diagnostic study, and that the patient continues to need, on a daily basis, active treatment furnished directly by or requiring the supervision of inpatient psychiatric facility personnel. In addition, the hospital records show that services furnished were intensive treatment services, admission or related services, or equivalent services.    EXPECTED DISCHARGE DATE/DAY: TBD     DISPOSITION: Home       Signed By:   Hiram Gash, MD  11/16/2019

## 2019-11-17 MED FILL — BUPROPION XL 150 MG 24 HR TAB: 150 mg | ORAL | Qty: 1

## 2019-11-17 MED FILL — TRAZODONE 50 MG TAB: 50 mg | ORAL | Qty: 1

## 2019-11-17 MED FILL — HYDRALAZINE 50 MG TAB: 50 mg | ORAL | Qty: 1

## 2019-11-17 MED FILL — HEALTHYLAX 17 GRAM ORAL POWDER PACKET: 17 gram | ORAL | Qty: 1

## 2019-11-17 MED FILL — LOSARTAN 50 MG TAB: 50 mg | ORAL | Qty: 2

## 2019-11-17 MED FILL — ATORVASTATIN 40 MG TAB: 40 mg | ORAL | Qty: 1

## 2019-11-17 MED FILL — HYDROCHLOROTHIAZIDE 25 MG TAB: 25 mg | ORAL | Qty: 1

## 2019-11-17 NOTE — Progress Notes (Signed)
Problem: Suicide  Goal: *STG/LTG: Complies with medication therapy  Outcome: Progressing Towards Goal  Goal: *STG/LTG: No longer expresses self destructive or suicidal thoughts  Outcome: Not Progressing Towards Goal

## 2019-11-17 NOTE — Behavioral Health Treatment Team (Signed)
GROUP THERAPY PROGRESS NOTE    Patient did not participate in Discharge Planning Group.     Caroline Lavoie, MSW

## 2019-11-17 NOTE — Behavioral Health Treatment Team (Addendum)
PSYCHIATRIC PROGRESS NOTE         Patient Name  Julian [name]   Date of Birth 03/09/1961   CSN [id number]   Medical Record Number  [id number]      Age  59 y.o.   PCP Other, Phys, MD   Admit date:  11/15/2019    Room Number  325/01  @ [facility]   Date of Service  11/17/2019         E & M PROGRESS NOTE:         HISTORY       CC:  "suicidal ideation"  HISTORY OF PRESENT ILLNESS/INTERVAL HISTORY:  (reviewed/updated 11/17/2019).  per initial evaluation: The patient, Julian [name], is a 59 y.o.  BLACK/[place]FR[place] AMER[place] male with a past psychiatric history significant for MDD, who presents at this time with complaints of (and/or evidence of) the following emotional symptoms: depression and suicidal thoughts/threats.  Additional symptomatology include family stressors.  The above symptoms have been present for 2+ days. These symptoms are of moderate to high severity. These symptoms are intermittent/ fleeting in nature.  The patient's condition has been precipitated by psychosocial stressors.  Patient's condition made worse by treatment noncompliance. UDS: negative; BAL=0.  ??  Per admission documentation, patient reported a desire to run into traffic leading to the current presentation he states that a family member urged him to come in. He reports conditional suicidality and denies prior hospitalizations.  ??  The patient is an unreliable historian. The patient corroborates the above narrative. The patient contracts for safety on the unit and gives consent for the team to contact collateral. The patient is amenable to initiating treatment while on the unit. The patient states that he stopped taking his Wellbutrin due to frustration with his family, he is evasive about the circumstances preceding this situation. The patient reports he lives alone but per SW assessment has been staying on couches.    4/04 - the patient slept 7.5 hours, remains irritable but calm and cooperative. Patient had a disagreement with his  roommate, and was moved to a different room where he is doing better. Patient denies active thoughts of self harm, but endorses passive SI and anxiety; depression persists. Patient medication compliant, able to make needs known.??    4/05 - no acute overnight events. Patient slept 6.5 hours, has been irritable with staff, endorsing ongoing conditional suicidality - he states that if discharged he will attempt to hurt himself. Patient reports anxiety and depression but denies active thoughts of self harm. Patient medication compliant, no PRNs given for agitation x24 hours. Patient smiling, cooperative with peers and in no apparent distress. He denies making conditionally suicidal statements.        SIDE EFFECTS: (reviewed/updated 11/17/2019)  None reported or admitted to.     ALLERGIES:(reviewed/updated 11/17/2019)  Allergies   Allergen Reactions   ??? Ace Inhibitors Cough      MEDICATIONS PRIOR TO ADMISSION:(reviewed/updated 11/17/2019)  Medications Prior to Admission   Medication Sig   ??? buPROPion XL (WELLBUTRIN XL) 150 mg tablet Take 150 mg by mouth daily. Indications: major depressive disorder, Patient reports last dose x 4 days ago.   ??? hydrOXYzine HCL (ATARAX) 50 mg tablet Take 50 mg by mouth two (2) times a day. Indications: anxious, Patient reports last dose x 4 days ago   ??? naproxen (NAPROSYN) 500 mg tablet Take 500 mg by mouth two (2) times daily as needed for Pain. Indications: pain   ??? hydrALAZINE (  APRESOLINE) 50 mg tablet Take 50 mg by mouth two (2) times a day. Indications: high blood pressure, Patient reports last dose x 2 weeks ago.   ??? losartan-hydroCHLOROthiazide (HYZAAR) 100-25 mg per tablet Take 1 Tab by mouth daily. Indications: high blood pressure, Patient reports last dose x 2 weeks ago   ??? pregabalin (LYRICA) 50 mg capsule Take 50 mg by mouth two (2) times a day. Indications: neuropathic pain   ??? polyethylene glycol (Miralax) 17 gram/dose powder Take 17 g by mouth daily. Indications: constipation   ???  atorvastatin (LIPITOR) 40 mg tablet Take 40 mg by mouth nightly. Indications: excessive fat in the blood   ??? hydrocortisone (CORTAID) 1 % topical cream Apply  to affected area two (2) times daily as needed for Skin Irritation. use thin layer   Indications: inflammation of the skin due to an allergy   ??? lidocaine (XYLOCAINE) 4 % topical cream Apply  to affected area two (2) times daily as needed for Pain. Indications: skin irritation   ??? green tea leaf extract (GREEN TEA PO) Take 500 mg by mouth. Indications: herbal supplement   ??? B-complex with vitamin C (VITAMIN B COMPLEX-C PO) Take 1 Tab by mouth. Indications: vitamin B deficiency   ??? Panax ginseng root (GINSENG KOREAN PO) Take 100 mg by mouth.   ??? eucalypt/men/camph/turp/wh.pet (MEDICATED CHEST RUB EX) by Apply Externally route as needed for Cough.   ??? diphenhydrAMINE-zinc acetate 2%-0.1% (BENADRYL) 2-0.1 % topical cream Apply  to affected area two (2) times daily as needed for Itching.   ??? guaiFENesin (ORGANIDIN) 400 mg tablet Take  by mouth every four (4) hours as needed for Congestion.   ??? ferrous sulfate 325 mg (65 mg iron) tablet Take  by mouth Daily (before breakfast). Indications: anemia from inadequate iron   ??? miconazole nitrate 2 % aerp by Apply Externally route as needed (athlete's foot). Indications: athlete's foot   ??? ginkgo biloba 120 mg tab Take 1 Tab by mouth. Indications: herbal supplement   ??? trolamine salicylate (Arthricream) 10 % topical cream Apply  to affected area as needed for Stiffness.   ??? methyl salicylate/menthol (BEN GAY EX) by Apply Externally route as needed.   ??? acetaminophen (TYLENOL) 500 mg tablet Take 1 Tab by mouth every six (6) hours as needed for Pain. (Patient taking differently: Take 500 mg by mouth every six (6) hours as needed for Pain. Indications: pain)      PAST MEDICAL HISTORY: Past medical history from the initial psychiatric evaluation has been reviewed (reviewed/updated 11/17/2019) with no additional updates (I asked  patient and no additional past medical history provided).   Past Medical History:   Diagnosis Date   ??? Anxiety    ??? Chronic constipation    ??? Diabetes (HCC)    ??? Hypertension    ??? Major depression    ??? OCD (obsessive compulsive disorder)    History reviewed. No pertinent surgical history.   SOCIAL HISTORY: Social history from the initial psychiatric evaluation has been reviewed (reviewed/updated 11/17/2019) with no additional updates (I asked patient and no additional social history provided).   Social History     Socioeconomic History   ??? Marital status: SINGLE     Spouse name: Not on file   ??? Number of children: Not on file   ??? Years of education: Not on file   ??? Highest education level: Not on file   Occupational History   ??? Not on file   Social Needs   ???  Financial resource strain: Not on file   ??? Food insecurity     Worry: Not on file     Inability: Not on file   ??? Transportation needs     Medical: Not on file     Non-medical: Not on file   Tobacco Use   ??? Smoking status: Never Smoker   ??? Smokeless tobacco: Never Used   Substance and Sexual Activity   ??? Alcohol use: No     Frequency: Never   ??? Drug use: No   ??? Sexual activity: Not Currently   Lifestyle   ??? Physical activity     Days per week: Not on file     Minutes per session: Not on file   ??? Stress: Not on file   Relationships   ??? Social Wellsite geologist on phone: Not on file     Gets together: Not on file     Attends religious service: Not on file     Active member of club or organization: Not on file     Attends meetings of clubs or organizations: Not on file     Relationship status: Not on file   ??? Intimate partner violence     Fear of current or ex partner: Not on file     Emotionally abused: Not on file     Physically abused: Not on file     Forced sexual activity: Not on file   Other Topics Concern   ??? Not on file   Social History Narrative   ??? Not on file      FAMILY HISTORY: Family history from the initial psychiatric evaluation has been reviewed  (reviewed/updated 11/17/2019) with no additional updates (I asked patient and no additional family history provided). History reviewed. No pertinent family history.    REVIEW OF SYSTEMS: (reviewed/updated 11/17/2019)  Appetite:improved   Sleep: no change   All other Review of Systems: Negative except per HPI         MENTAL STATUS EXAM & VITALS     MENTAL STATUS EXAM (MSE):    MSE FINDINGS ARE WITHIN NORMAL LIMITS (WNL) UNLESS OTHERWISE STATED BELOW. ( ALL OF THE BELOW CATEGORIES OF THE MSE HAVE BEEN REVIEWED (reviewed 11/17/2019) AND UPDATED AS DEEMED APPROPRIATE )  General Presentation age appropriate, cooperative and guarded   Orientation oriented to time, place and person   Vital Signs  See below (reviewed 11/17/2019); Vital Signs (BP, Pulse, & Temp) are within normal limits if not listed below.   Gait and Station Stable/steady, no ataxia   Musculoskeletal System No extrapyramidal symptoms (EPS); no abnormal muscular movements or Tardive Dyskinesia (TD); muscle strength and tone are within normal limits   Language No aphasia or dysarthria   Speech:  normal volume and non-pressured   Thought Processes logical; normal rate of thoughts; fair abstract reasoning/computation   Thought Associations goal directed   Thought Content preoccupations   Suicidal Ideations contracts for safety   Homicidal Ideations none   Mood:  depressed   Affect:  euthymic and mood-incongruent   Memory recent  intact   Memory remote:  intact   Concentration/Attention:  intact   Fund of Knowledge average   Insight:  limited   Reliability poor   Judgment:  fair          VITALS:     Patient Vitals for the past 24 hrs:   Temp Pulse Resp BP SpO2   11/17/19 0926 ??? 96 ??? (!)  152/104 ???   11/16/19 1955 98.2 ??F (36.8 ??C) (!) 106 20 (!) 148/94 99 %   11/16/19 1539 98 ??F (36.7 ??C) 78 18 (!) 159/90 100 %     Wt Readings from Last 3 Encounters:   11/15/19 90.7 kg (200 lb)     Temp Readings from Last 3 Encounters:   11/16/19 98.2 ??F (36.8 ??C)   07/01/17 97.5 ??F  (36.4 ??C)     BP Readings from Last 3 Encounters:   11/17/19 (!) 152/104   07/01/17 (!) 168/109     Pulse Readings from Last 3 Encounters:   11/17/19 96   07/01/17 78            DATA     LABORATORY DATA:(reviewed/updated 11/17/2019)  No results found for this or any previous visit (from the past 24 hour(s)).  No results found for: VALF2, VALAC, VALP, VALPR, DS6, CRBAM, CRBAMP, CARB2, XCRBAM  No results found for: LITHM   RADIOLOGY REPORTS:(reviewed/updated 11/17/2019)  No results found.       MEDICATIONS     ALL MEDICATIONS:   Current Facility-Administered Medications   Medication Dose Route Frequency   ??? OLANZapine (ZyPREXA) tablet 5 mg  5 mg Oral Q6H PRN   ??? haloperidol lactate (HALDOL) injection 5 mg  5 mg IntraMUSCular Q6H PRN   ??? benztropine (COGENTIN) tablet 1 mg  1 mg Oral BID PRN   ??? diphenhydrAMINE (BENADRYL) injection 50 mg  50 mg IntraMUSCular BID PRN   ??? hydrOXYzine HCL (ATARAX) tablet 50 mg  50 mg Oral TID PRN   ??? LORazepam (ATIVAN) injection 1 mg  1 mg IntraMUSCular Q4H PRN   ??? traZODone (DESYREL) tablet 50 mg  50 mg Oral QHS PRN   ??? magnesium hydroxide (MILK OF MAGNESIA) 400 mg/5 mL oral suspension 30 mL  30 mL Oral DAILY PRN   ??? hydrALAZINE (APRESOLINE) tablet 50 mg  50 mg Oral BID   ??? polyethylene glycol (MIRALAX) packet 17 g  17 g Oral DAILY   ??? naproxen (NAPROSYN) tablet 500 mg  500 mg Oral Q8H PRN   ??? buPROPion XL (WELLBUTRIN XL) tablet 150 mg  150 mg Oral QHS   ??? acetaminophen (TYLENOL) tablet 650 mg  650 mg Oral Q4H PRN   ??? losartan/hydroCHLOROthiazide (HYZAAR) 100/25 mg   Oral DAILY   ??? atorvastatin (LIPITOR) tablet 40 mg  40 mg Oral QHS   ??? pregabalin (LYRICA) capsule 100 mg  100 mg Oral DAILY PRN      SCHEDULED MEDICATIONS:   Current Facility-Administered Medications   Medication Dose Route Frequency   ??? hydrALAZINE (APRESOLINE) tablet 50 mg  50 mg Oral BID   ??? polyethylene glycol (MIRALAX) packet 17 g  17 g Oral DAILY   ??? buPROPion XL (WELLBUTRIN XL) tablet 150 mg  150 mg Oral QHS   ???  losartan/hydroCHLOROthiazide (HYZAAR) 100/25 mg   Oral DAILY   ??? atorvastatin (LIPITOR) tablet 40 mg  40 mg Oral QHS          ASSESSMENT & PLAN     DIAGNOSES REQUIRING ACTIVE TREATMENT AND MONITORING: (reviewed/updated 11/17/2019)  Patient Active Hospital Problem List:   MDD (major depressive disorder), recurrent episode, moderate (HCC) (11/15/2019)    Assessment: patient with ongoing family stressors leading to hospitalization. No substances on board and patient denies prior episodes. He states that he went off antidepressant and is now feeling suicidal. Unclear housing situation and secondary gain may be a factor. Will work with patient  to secure safe disposition plan.    Plan:  - CONTINUE Wellbutrin XL 150 mg QDAY for MDD  - IGM therapy as tolerated  - Expand database / obtain collateral  - Dispo planning    In summary, SHAVAR GORKA, is a 59 y.o.  male who presents with a severe exacerbation of the principal diagnosis of MDD (major depressive disorder), recurrent episode, moderate ([facility])    Patient's condition is improving.    Patient requires continued inpatient hospitalization for further stabilization, safety monitoring and medication management.  I will continue to coordinate the provision of individual, milieu, occupational, group, and substance abuse therapies to address target symptoms/diagnoses as deemed appropriate for the individual patient.  A coordinated, multidisplinary treatment team round was conducted with the patient (this team consists of the nurse, psychiatric unit pharmacist, Catering manager).     Complete current electronic health record for patient has been reviewed today including consultant notes, ancillary staff notes, nurses and psychiatric tech notes.    Suicide risk assessment completed and patient deemed to be of low risk for suicide at this time.     The following regarding medications was addressed during rounds with patient:   the risks and benefits of the proposed medication.  The patient was given the opportunity to ask questions. Informed consent given to the use of the above medications. Will continue to adjust psychiatric and non-psychiatric medications (see above "medication" section and orders section for details) as deemed appropriate & based upon diagnoses and response to treatment.     I will continue to order blood tests/labs and diagnostic tests as deemed appropriate and review results as they become available (see orders for details and above listed lab/test results).    I will order psychiatric records from previous psych hospitals to further elucidate the nature of patient's psychopathology and review once available.    I will gather additional collateral information from friends, family and o/p treatment team to further elucidate the nature of patient's psychopathology and baselline level of psychiatric functioning.         I certify that this patient's inpatient psychiatric hospital services furnished since the previous certification were, and continue to be, required for treatment that could reasonably be expected to improve the patient's condition, or for diagnostic study, and that the patient continues to need, on a daily basis, active treatment furnished directly by or requiring the supervision of inpatient psychiatric facility personnel. In addition, the hospital records show that services furnished were intensive treatment services, admission or related services, or equivalent services.    EXPECTED DISCHARGE DATE/DAY: 11/19/19     DISPOSITION: Home       Signed By:   Raeford Razor, MD  11/17/2019

## 2019-11-17 NOTE — Behavioral Health Treatment Team (Signed)
GROUP THERAPY PROGRESS NOTE    Patient did not participate in Coping Skills group.     Caroline Lavoie, MSW

## 2019-11-17 NOTE — Behavioral Health Treatment Team (Addendum)
GROUP THERAPY PROGRESS NOTE    Patient is participating in a Coping Skills group.    Group time: 45 minutes     Personal goal for participation: Discuss patient???s strengths, positive aspects of life and positive affirmations to increase self-esteem and promote healing.    Goal orientation: Personal    Group therapy participation: passive    Therapeutic interventions reviewed and discussed: Completion of ???My Strengths and Qualities??? activity sheet. Group discussion on personal patient???s strengths including things they are good at, ways they???ve helped others, what makes them unique, and challenges they have overcome. Discussion regarding positive things others say about patients and positive words patient speak over themselves. Group discussed the benefit and power of positive thinking and self-talk and its role in improving mental health challenges. Group discussed having a positive mind set as a helpful coping skill.    Impression of participation: Pt presented with depressed mood, flat affect, guarded. Pt sitting in corner of day room coloring throughout group session. Pt declined to complete activity or verbally participate. Pt calm and in good behavioral control.     Caroline Lavoie, MSW

## 2019-11-17 NOTE — Behavioral Health Treatment Team (Addendum)
1930  Assumed care of patient from day shift nurse. Patient is visible out in the day room.   1945  Refused to allow staff to check his vital signs.     2015  Patient rates his current anxiety 6/10 and depression 5/10. Patient endorsing passive SI. Reports feeling safe in the hospital. Does state that he does feel a little better. Patient is smiling. Will continue to monitor.       0600  Patient slept for 7 hours this shift. No issues.

## 2019-11-17 NOTE — Behavioral Health Treatment Team (Addendum)
Behavioral Health Treatment Team Note     Patient goal(s) for today: Take medications as described.   Treatment team focus/goals: Medication adjustments.   Progress note: Pt was seen in treatment team this morning. Pt is alert and oriented. Pt denies SI/HI. Pt's mood is depressed, affect is WNL. Pt's thought process is coherent. Pt's insight and judgment is limited, reliability is poor.  Social work department will continue to coordinate discharge plans.     LOS:  2  Expected LOS:     Financial concerns/prescription coverage: Blue Cross Medicaid Part A&B with QMB Extended Coverage and [employer] [facility] [employer] [facility] [place] Medicaid ??? Voluntary  Date of last family contact: None    Family requesting physician contact today: No  Discharge plan: TBD  Guns in the home: None involved.      Outpatient provider(s): To be linked.     Participating treatment team members: Daiton D Sica, Ali Flinn, MSW and Dr. Mbadugha, MD

## 2019-11-17 NOTE — Group Note (Cosign Needed)
IP BH GROUP DOCUMENTATION INDIVIDUAL                                                                          Group Therapy Note    Date: 11/17/2019    Group Start Time: 1000  Group End Time: 1100  Group Topic: Topic Group    [facility]    Baker, Beverly S    IP BH GROUP DOCUMENTATION GROUP    Group Therapy Note    Attendees: 9         Attendance: Did not attend    Patient's Goal:      Interventions/techniques:  Beverly S Baker

## 2019-11-17 NOTE — Progress Notes (Signed)
Problem: Suicide  Goal: *STG: Remains safe in hospital  Outcome: Progressing Towards Goal  Goal: *STG/LTG: Complies with medication therapy  Outcome: Progressing Towards Goal   Problem: Depressed Mood (Adult/Pediatric)  Goal: *STG: Participates in treatment plan  Outcome: Progressing Towards Goal  Goal: *STG: Remains safe in hospital  Outcome: Progressing Towards Goal  Goal: *STG: Complies with medication therapy  Outcome: Progressing Towards Goal   0700-0730 Report received from Shreya,RN.     0924 Patient up in his room. Patient agitated and hostile. Patient refused to allow this nurse to check his blood pressure. Patient stated, "Theres no point in checking it since I haven't been given my medications." This nurse was unaware patient had not been given his scheduled BP medications. Escorted patient to the medication window to get his medications. Patient was given his medications as ordered. Patient made aggressive comments and this nurse advised patient that was not appropriate and he stopped. This nurse escorted patient back to his room and he was able to calm down and complete his assessment. AAOX4. Speech clear. Resp. Even and unlabored. NAD noted. Skin WNL for race. Ambulates with steady gait without the use of assistive devices. Patient reported still having thoughts of SI but safe here because he can not carry out his plan while in the hospital. Patient denies HI, AVH, and pain. Patient reports anxiety and depression 5-6 at this time. No other needs, wants, or concerns voiced at this time. Will continue to monitor.     Patient slept for most of the morning and came out of his room this afternoon. Patient attempted to refused BP for hydralazine administration but this nurse explained it was needed. Patient agreed and allowed this nurse to complete BP.

## 2019-11-17 NOTE — Group Note (Cosign Needed)
IP BH GROUP DOCUMENTATION INDIVIDUAL                                                                          Group Therapy Note    Date: 11/17/2019    Group Start Time: 1400  Group End Time: 1500  Group Topic: Recreational/Music Therapy    [facility]    [name], Julian S    IP BH GROUP DOCUMENTATION GROUP    Group Therapy Note    Attendees: 10         Attendance: Attended    Patient's Goal:  To concentrate on selected task    Interventions/techniques: Supported-crafts,games,music    Follows Directions: Followed directions    Interactions: Interacted appropriately    Mental Status: Calm    Behavior/appearance: Attentive, Cooperative and Needed prompting    Goals Achieved: Able to engage in interactions and Able to listen to others      Additional Notes:      Julian [name]

## 2019-11-17 NOTE — Progress Notes (Signed)
Problem: Suicide  Goal: *STG: Remains safe in hospital  Outcome: Progressing Towards Goal  Goal: *STG/LTG: Complies with medication therapy  Outcome: Progressing Towards Goal

## 2019-11-17 NOTE — Behavioral Health Treatment Team (Signed)
1930  Assumed care of patient from day shift nurse. Patient is visible out in the day room.   1945  Refused to allow staff to check his vital signs.     2015  Patient rates his current anxiety 6/10 and depression 5/10. Patient endorsing passive SI. Reports feeling safe in the hospital. Does state that he does feel a little better. Patient is smiling. Will continue to monitor.       0600  Patient slept for 7 hours this shift. No issues.

## 2019-11-17 NOTE — Behavioral Health Treatment Team (Signed)
PSYCHIATRIC PROGRESS NOTE         Patient Name  Julian [name]   Date of Birth 04/30/1961   CSN [id number]   Medical Record Number  [id number]      Age  59 y.o.   PCP Other, Phys, MD   Admit date:  11/15/2019    [facility]  [facility]/01  [facility]   Date of Service  11/17/2019         E & M PROGRESS NOTE:         HISTORY       CC:  "suicidal ideation"  HISTORY OF PRESENT ILLNESS/INTERVAL HISTORY:  (reviewed/updated 11/17/2019).  per initial evaluation: The patient, Julian [name], is a 59 y.o.  BLACK/[place] AMER[place] male with a past psychiatric history significant for MDD, who presents at this time with complaints of (and/or evidence of) the following emotional symptoms: depression and suicidal thoughts/threats.  Additional symptomatology include family stressors.  The above symptoms have been present for 2+ days. These symptoms are of moderate to high severity. These symptoms are intermittent/ fleeting in nature.  The patient's condition has been precipitated by psychosocial stressors.  Patient's condition made worse by treatment noncompliance. UDS: negative; BAL=0.  ??  Per admission documentation, patient reported a desire to run into traffic leading to the current presentation he states that a family member urged him to come in. He reports conditional suicidality and denies prior hospitalizations.  ??  The patient is an unreliable historian. The patient corroborates the above narrative. The patient contracts for safety on the unit and gives consent for the team to contact collateral. The patient is amenable to initiating treatment while on the unit. The patient states that he stopped taking his Wellbutrin due to frustration with his family, he is evasive about the circumstances preceding this situation. The patient reports he lives alone but per SW assessment has been staying on couches.    4/04 - the patient slept 7.5 hours, remains irritable but calm and cooperative. Patient had a disagreement with his  roommate, and was moved to a different room where he is doing better. Patient denies active thoughts of self harm, but endorses passive SI and anxiety; depression persists. Patient medication compliant, able to make needs known.??    4/05 - no acute overnight events. Patient slept 6.5 hours, has been irritable with staff, endorsing ongoing conditional suicidality - he states that if discharged he will attempt to hurt himself. Patient reports anxiety and depression but denies active thoughts of self harm. Patient medication compliant, no PRNs given for agitation x24 hours. Patient smiling, cooperative with peers and in no apparent distress. He denies making conditionally suicidal statements.        SIDE EFFECTS: (reviewed/updated 11/17/2019)  None reported or admitted to.     ALLERGIES:(reviewed/updated 11/17/2019)  Allergies   Allergen Reactions   ??? Ace Inhibitors Cough      MEDICATIONS PRIOR TO ADMISSION:(reviewed/updated 11/17/2019)  Medications Prior to Admission   Medication Sig   ??? buPROPion XL (WELLBUTRIN XL) 150 mg tablet Take 150 mg by mouth daily. Indications: major depressive disorder, Patient reports last dose x 4 days ago.   ??? hydrOXYzine HCL (ATARAX) 50 mg tablet Take 50 mg by mouth two (2) times a day. Indications: anxious, Patient reports last dose x 4 days ago   ??? naproxen (NAPROSYN) 500 mg tablet Take 500 mg by mouth two (2) times daily as needed for Pain. Indications: pain   ??? hydrALAZINE (  APRESOLINE) 50 mg tablet Take 50 mg by mouth two (2) times a day. Indications: high blood pressure, Patient reports last dose x 2 weeks ago.   ??? losartan-hydroCHLOROthiazide (HYZAAR) 100-25 mg per tablet Take 1 Tab by mouth daily. Indications: high blood pressure, Patient reports last dose x 2 weeks ago   ??? pregabalin (LYRICA) 50 mg capsule Take 50 mg by mouth two (2) times a day. Indications: neuropathic pain   ??? polyethylene glycol (Miralax) 17 gram/dose powder Take 17 g by mouth daily. Indications: constipation   ???  atorvastatin (LIPITOR) 40 mg tablet Take 40 mg by mouth nightly. Indications: excessive fat in the blood   ??? hydrocortisone (CORTAID) 1 % topical cream Apply  to affected area two (2) times daily as needed for Skin Irritation. use thin layer   Indications: inflammation of the skin due to an allergy   ??? lidocaine (XYLOCAINE) 4 % topical cream Apply  to affected area two (2) times daily as needed for Pain. Indications: skin irritation   ??? green tea leaf extract (GREEN TEA PO) Take 500 mg by mouth. Indications: herbal supplement   ??? B-complex with vitamin C (VITAMIN B COMPLEX-C PO) Take 1 Tab by mouth. Indications: vitamin B deficiency   ??? Panax ginseng root (GINSENG KOREAN PO) Take 100 mg by mouth.   ??? eucalypt/men/camph/turp/wh.pet (MEDICATED CHEST RUB EX) by Apply Externally route as needed for Cough.   ??? diphenhydrAMINE-zinc acetate 2%-0.1% (BENADRYL) 2-0.1 % topical cream Apply  to affected area two (2) times daily as needed for Itching.   ??? guaiFENesin (ORGANIDIN) 400 mg tablet Take  by mouth every four (4) hours as needed for Congestion.   ??? ferrous sulfate 325 mg (65 mg iron) tablet Take  by mouth Daily (before breakfast). Indications: anemia from inadequate iron   ??? miconazole nitrate 2 % aerp by Apply Externally route as needed (athlete's foot). Indications: athlete's foot   ??? ginkgo biloba 120 mg tab Take 1 Tab by mouth. Indications: herbal supplement   ??? trolamine salicylate (Arthricream) 10 % topical cream Apply  to affected area as needed for Stiffness.   ??? methyl salicylate/menthol (BEN GAY EX) by Apply Externally route as needed.   ??? acetaminophen (TYLENOL) 500 mg tablet Take 1 Tab by mouth every six (6) hours as needed for Pain. (Patient taking differently: Take 500 mg by mouth every six (6) hours as needed for Pain. Indications: pain)      PAST MEDICAL HISTORY: Past medical history from the initial psychiatric evaluation has been reviewed (reviewed/updated 11/17/2019) with no additional updates (I asked  patient and no additional past medical history provided).   Past Medical History:   Diagnosis Date   ??? Anxiety    ??? Chronic constipation    ??? Diabetes (HCC)    ??? Hypertension    ??? Major depression    ??? OCD (obsessive compulsive disorder)    History reviewed. No pertinent surgical history.   SOCIAL HISTORY: Social history from the initial psychiatric evaluation has been reviewed (reviewed/updated 11/17/2019) with no additional updates (I asked patient and no additional social history provided).   Social History     Socioeconomic History   ??? Marital status: SINGLE     Spouse name: Not on file   ??? Number of children: Not on file   ??? Years of education: Not on file   ??? Highest education level: Not on file   Occupational History   ??? Not on file   Social Needs   ???  Financial resource strain: Not on file   ??? Food insecurity     Worry: Not on file     Inability: Not on file   ??? Transportation needs     Medical: Not on file     Non-medical: Not on file   Tobacco Use   ??? Smoking status: Never Smoker   ??? Smokeless tobacco: Never Used   Substance and Sexual Activity   ??? Alcohol use: No     Frequency: Never   ??? Drug use: No   ??? Sexual activity: Not Currently   Lifestyle   ??? Physical activity     Days per week: Not on file     Minutes per session: Not on file   ??? Stress: Not on file   Relationships   ??? Social Wellsite geologist on phone: Not on file     Gets together: Not on file     Attends religious service: Not on file     Active member of club or organization: Not on file     Attends meetings of clubs or organizations: Not on file     Relationship status: Not on file   ??? Intimate partner violence     Fear of current or ex partner: Not on file     Emotionally abused: Not on file     Physically abused: Not on file     Forced sexual activity: Not on file   Other Topics Concern   ??? Not on file   Social History Narrative   ??? Not on file      FAMILY HISTORY: Family history from the initial psychiatric evaluation has been reviewed  (reviewed/updated 11/17/2019) with no additional updates (I asked patient and no additional family history provided). History reviewed. No pertinent family history.    REVIEW OF SYSTEMS: (reviewed/updated 11/17/2019)  Appetite:improved   Sleep: no change   All other Review of Systems: Negative except per HPI         MENTAL STATUS EXAM & VITALS     MENTAL STATUS EXAM (MSE):    MSE FINDINGS ARE WITHIN NORMAL LIMITS (WNL) UNLESS OTHERWISE STATED BELOW. ( ALL OF THE BELOW CATEGORIES OF THE MSE HAVE BEEN REVIEWED (reviewed 11/17/2019) AND UPDATED AS DEEMED APPROPRIATE )  General Presentation age appropriate, cooperative and guarded   Orientation oriented to time, place and person   Vital Signs  See below (reviewed 11/17/2019); Vital Signs (BP, Pulse, & Temp) are within normal limits if not listed below.   Gait and Station Stable/steady, no ataxia   Musculoskeletal System No extrapyramidal symptoms (EPS); no abnormal muscular movements or Tardive Dyskinesia (TD); muscle strength and tone are within normal limits   Language No aphasia or dysarthria   Speech:  normal volume and non-pressured   Thought Processes logical; normal rate of thoughts; fair abstract reasoning/computation   Thought Associations goal directed   Thought Content preoccupations   Suicidal Ideations contracts for safety   Homicidal Ideations none   Mood:  depressed   Affect:  euthymic and mood-incongruent   Memory recent  intact   Memory remote:  intact   Concentration/Attention:  intact   Fund of Knowledge average   Insight:  limited   Reliability poor   Judgment:  fair          VITALS:     Patient Vitals for the past 24 hrs:   Temp Pulse Resp BP SpO2   11/17/19 0926 ??? 96 ??? (!)  152/104 ???   11/16/19 1955 98.2 ??F (36.8 ??C) (!) 106 20 (!) 148/94 99 %   11/16/19 1539 98 ??F (36.7 ??C) 78 18 (!) 159/90 100 %     Wt Readings from Last 3 Encounters:   11/15/19 90.7 kg (200 lb)     Temp Readings from Last 3 Encounters:   11/16/19 98.2 ??F (36.8 ??C)   07/01/17 97.5 ??F  (36.4 ??C)     BP Readings from Last 3 Encounters:   11/17/19 (!) 152/104   07/01/17 (!) 168/109     Pulse Readings from Last 3 Encounters:   11/17/19 96   07/01/17 78            DATA     LABORATORY DATA:(reviewed/updated 11/17/2019)  No results found for this or any previous visit (from the past 24 hour(s)).  No results found for: VALF2, VALAC, VALP, VALPR, DS6, CRBAM, CRBAMP, CARB2, XCRBAM  No results found for: LITHM   RADIOLOGY REPORTS:(reviewed/updated 11/17/2019)  No results found.       MEDICATIONS     ALL MEDICATIONS:   Current Facility-Administered Medications   Medication Dose Route Frequency   ??? OLANZapine (ZyPREXA) tablet 5 mg  5 mg Oral Q6H PRN   ??? haloperidol lactate (HALDOL) injection 5 mg  5 mg IntraMUSCular Q6H PRN   ??? benztropine (COGENTIN) tablet 1 mg  1 mg Oral BID PRN   ??? diphenhydrAMINE (BENADRYL) injection 50 mg  50 mg IntraMUSCular BID PRN   ??? hydrOXYzine HCL (ATARAX) tablet 50 mg  50 mg Oral TID PRN   ??? LORazepam (ATIVAN) injection 1 mg  1 mg IntraMUSCular Q4H PRN   ??? traZODone (DESYREL) tablet 50 mg  50 mg Oral QHS PRN   ??? magnesium hydroxide (MILK OF MAGNESIA) 400 mg/5 mL oral suspension 30 mL  30 mL Oral DAILY PRN   ??? hydrALAZINE (APRESOLINE) tablet 50 mg  50 mg Oral BID   ??? polyethylene glycol (MIRALAX) packet 17 g  17 g Oral DAILY   ??? naproxen (NAPROSYN) tablet 500 mg  500 mg Oral Q8H PRN   ??? buPROPion XL (WELLBUTRIN XL) tablet 150 mg  150 mg Oral QHS   ??? acetaminophen (TYLENOL) tablet 650 mg  650 mg Oral Q4H PRN   ??? losartan/hydroCHLOROthiazide (HYZAAR) 100/25 mg   Oral DAILY   ??? atorvastatin (LIPITOR) tablet 40 mg  40 mg Oral QHS   ??? pregabalin (LYRICA) capsule 100 mg  100 mg Oral DAILY PRN      SCHEDULED MEDICATIONS:   Current Facility-Administered Medications   Medication Dose Route Frequency   ??? hydrALAZINE (APRESOLINE) tablet 50 mg  50 mg Oral BID   ??? polyethylene glycol (MIRALAX) packet 17 g  17 g Oral DAILY   ??? buPROPion XL (WELLBUTRIN XL) tablet 150 mg  150 mg Oral QHS   ???  losartan/hydroCHLOROthiazide (HYZAAR) 100/25 mg   Oral DAILY   ??? atorvastatin (LIPITOR) tablet 40 mg  40 mg Oral QHS          ASSESSMENT & PLAN     DIAGNOSES REQUIRING ACTIVE TREATMENT AND MONITORING: (reviewed/updated 11/17/2019)  Patient Active Hospital Problem List:   MDD (major depressive disorder), recurrent episode, moderate (HCC) (11/15/2019)    Assessment: patient with ongoing family stressors leading to hospitalization. No substances on board and patient denies prior episodes. He states that he went off antidepressant and is now feeling suicidal. Unclear housing situation and secondary gain may be a factor. Will work with patient  to secure safe disposition plan.    Plan:  - CONTINUE Wellbutrin XL 150 mg QDAY for MDD  - IGM therapy as tolerated  - Expand database / obtain collateral  - Dispo planning    In summary, Julian [name], is a 59 y.o.  male who presents with a severe exacerbation of the principal diagnosis of MDD (major depressive disorder), recurrent episode, moderate ([place])    Patient's condition is improving.    Patient requires continued inpatient hospitalization for further stabilization, safety monitoring and medication management.  I will continue to coordinate the provision of individual, milieu, occupational, group, and substance abuse therapies to address target symptoms/diagnoses as deemed appropriate for the individual patient.  A coordinated, multidisplinary treatment team round was conducted with the patient (this team consists of the nurse, psychiatric unit pharmacist, Catering manager).     Complete current electronic health record for patient has been reviewed today including consultant notes, ancillary staff notes, nurses and psychiatric tech notes.    Suicide risk assessment completed and patient deemed to be of low risk for suicide at this time.     The following regarding medications was addressed during rounds with patient:   the risks and benefits of the proposed medication.  The patient was given the opportunity to ask questions. Informed consent given to the use of the above medications. Will continue to adjust psychiatric and non-psychiatric medications (see above "medication" section and orders section for details) as deemed appropriate & based upon diagnoses and response to treatment.     I will continue to order blood tests/labs and diagnostic tests as deemed appropriate and review results as they become available (see orders for details and above listed lab/test results).    I will order psychiatric records from previous psych hospitals to further elucidate the nature of patient's psychopathology and review once available.    I will gather additional collateral information from friends, family and o/p treatment team to further elucidate the nature of patient's psychopathology and baselline level of psychiatric functioning.         I certify that this patient's inpatient psychiatric hospital services furnished since the previous certification were, and continue to be, required for treatment that could reasonably be expected to improve the patient's condition, or for diagnostic study, and that the patient continues to need, on a daily basis, active treatment furnished directly by or requiring the supervision of inpatient psychiatric facility personnel. In addition, the hospital records show that services furnished were intensive treatment services, admission or related services, or equivalent services.    EXPECTED DISCHARGE DATE/DAY: 11/19/19     DISPOSITION: Home       Signed By:   Raeford Razor, MD  11/17/2019

## 2019-11-17 NOTE — Progress Notes (Signed)
 Problem: Suicide  Goal: *STG: Remains safe in hospital  Outcome: Progressing Towards Goal  Goal: *STG/LTG: Complies with medication therapy  Outcome: Progressing Towards Goal   Problem: Depressed Mood (Adult/Pediatric)  Goal: *STG: Participates in treatment plan  Outcome: Progressing Towards Goal  Goal: *STG: Remains safe in hospital  Outcome: Progressing Towards Goal  Goal: *STG: Complies with medication therapy  Outcome: Progressing Towards Goal   0700-0730 Report received from [facility]     [phone] Patient up in his room. Patient agitated and hostile. Patient refused to allow this nurse to check his blood pressure. Patient stated, Theres no point in checking it since I haven't been given my medications. This nurse was unaware patient had not been given his scheduled BP medications. Escorted patient to the medication window to get his medications. Patient was given his medications as ordered. Patient made aggressive comments and this nurse advised patient that was not appropriate and he stopped. This nurse escorted patient back to his room and he was able to calm down and complete his assessment. AAOX4. Speech clear. Resp. Even and unlabored. NAD noted. Skin WNL for race. Ambulates with steady gait without the use of assistive devices. Patient reported still having thoughts of SI but safe here because he can not carry out his plan while in the hospital. Patient denies HI, AVH, and pain. Patient reports anxiety and depression 5-6 at this time. No other needs, wants, or concerns voiced at this time. Will continue to monitor.     Patient slept for most of the morning and came out of his room this afternoon. Patient attempted to refused BP for hydralazine administration but this nurse explained it was needed. Patient agreed and allowed this nurse to complete BP.

## 2019-11-17 NOTE — Behavioral Health Treatment Team (Signed)
 GROUP THERAPY PROGRESS NOTE    Patient is participating in a Coping Skills group.    Group time: 45 minutes     Personal goal for participation: Discuss patient's strengths, positive aspects of life and positive affirmations to increase self-esteem and promote healing.    Goal orientation: Personal    Group therapy participation: passive    Therapeutic interventions reviewed and discussed: Completion of "My Strengths and Qualities" activity sheet. Group discussion on personal patient's strengths including things they are good at, ways they've helped others, what makes them unique, and challenges they have overcome. Discussion regarding positive things others say about patients and positive words patient speak over themselves. Group discussed the benefit and power of positive thinking and self-talk and its role in improving mental health challenges. Group discussed having a positive mind set as a helpful coping skill.    Impression of participation: Pt presented with depressed mood, flat affect, guarded. Pt sitting in corner of day room coloring throughout group session. Pt declined to complete activity or verbally participate. Pt calm and in good behavioral control.     Caroline Lavoie, MSW

## 2019-11-17 NOTE — Behavioral Health Treatment Team (Signed)
 Behavioral Health Treatment Team Note     Patient goal(s) for today: Take medications as described.   Treatment team focus/goals: Medication adjustments.   Progress note: Pt was seen in treatment team this morning. Pt is alert and oriented. Pt denies SI/HI. Pt's mood is depressed, affect is WNL. Pt's thought process is coherent. Pt's insight and judgment is limited, reliability is poor.  Social work department will continue to coordinate discharge plans.     LOS:  2  Expected LOS:     Financial concerns/prescription coverage: [employer] Medicaid Part A&B with [employer] Extended Coverage and [employer] [facility] [employer] [facility] [employer] Medicaid - Voluntary  Date of last family contact: None    Family requesting physician contact today: No  Discharge plan: TBD  Guns in the home: None involved.      Outpatient provider(s): To be linked.     Participating treatment team members: Daril JONETTA Pa, Ali Flinn, MSW and Dr. Debbie, MD

## 2019-11-17 NOTE — Progress Notes (Signed)
Problem: Suicide  Goal: *STG/LTG: Complies with medication therapy  Outcome: Progressing Towards Goal  Goal: *STG/LTG: No longer expresses self destructive or suicidal thoughts  Outcome: Not Progressing Towards Goal

## 2019-11-18 MED FILL — LOSARTAN 50 MG TAB: 50 mg | ORAL | Qty: 2

## 2019-11-18 MED FILL — ATORVASTATIN 40 MG TAB: 40 mg | ORAL | Qty: 1

## 2019-11-18 MED FILL — BUPROPION XL 150 MG 24 HR TAB: 150 mg | ORAL | Qty: 1

## 2019-11-18 MED FILL — HYDRALAZINE 50 MG TAB: 50 mg | ORAL | Qty: 1

## 2019-11-18 MED FILL — HYDROCHLOROTHIAZIDE 25 MG TAB: 25 mg | ORAL | Qty: 1

## 2019-11-18 MED FILL — HEALTHYLAX 17 GRAM ORAL POWDER PACKET: 17 gram | ORAL | Qty: 1

## 2019-11-18 NOTE — Behavioral Health Treatment Team (Signed)
0800 Report given by Brittany RN. pt is up in room.VSS.    1000 pt is visible on the unit. socializing with peers. Medication and meal compliant. Pt denies si/hi/a/v hall. States he has depression 4/10 and anxiety 6/10. Pt is smiling and requires redirection at times.     1200 pt ate lunch. Visible on the unit socializing with peers.     1400 pt is visible on the unit.     1600 pt is visible on the unit. Took scheduled medications.

## 2019-11-18 NOTE — Behavioral Health Treatment Team (Signed)
GROUP THERAPY PROGRESS NOTE    Patient is participating in Coping Skills group.     Group time: 50 minutes    Personal goal for participation: To identify coping skills to combat feeling misunderstood by society and support system    Goal orientation: Personal    Group therapy participation: active    Therapeutic interventions reviewed and discussed: Patient???s completed a worksheet titled ???The Masks We Wear??? depicting how the world perceives them vs true inner thoughts, feelings, and dialogue. Patient???s discussed ways in which the they feel misunderstood by the world and how they can more effectively communicate their mental health needs to society and their support systems. Patient???s identified coping skills to combat disparity between world???s view of patient and their view of self. Patient???s processed the ways in which wearing a metaphorical mask can be both beneficial and damaging.    Impression of participation: Pt presented with euthymic mood, clear speech, logical thought process, calm and cooperative, interacted appropriately with others. Pt completed activity, stated he tries to hide how he is feeling behind a mask but his support system often sees past the facade. Pt processed desire to be more true to self, to express feelings in a healthy way, to have self-confidence even if people are negative around him.     Caroline Lavoie, MSW

## 2019-11-18 NOTE — Group Note (Cosign Needed)
IP BH GROUP DOCUMENTATION INDIVIDUAL                                                                          Group Therapy Note    Date: 11/18/2019    Group Start Time: 1000  Group End Time: 1100  Group Topic: Topic Group    [facility]    Baker, Beverly S    IP BH GROUP DOCUMENTATION GROUP    Group Therapy Note    Attendees: 10         Attendance: Attended    Patient's Goal:  To participate in chair exercise routine    Interventions/techniques: Supported-benefits of exercise    Follows Directions: Followed directions    Interactions: Interacted appropriately    Mental Status: Calm    Behavior/appearance: Attentive, Cooperative and Needed prompting    Goals Achieved: Able to engage in interactions, Able to listen to others, Able to self-disclose and Discussed coping      Additional Notes:      Audelia Hives

## 2019-11-18 NOTE — Group Note (Cosign Needed)
IP BH GROUP DOCUMENTATION INDIVIDUAL                                                                          Group Therapy Note    Date: 11/18/2019    Group Start Time: 1400  Group End Time: 1500  Group Topic: Recreational/Music Therapy    [facility]    [name], Julian S    IP BH GROUP DOCUMENTATION GROUP    Group Therapy Note    Attendees: 8         Attendance: Attended    Patient's Goal:  To concentrate on selected task    Interventions/techniques: Supported-crafts,games,music    Follows Directions: Followed directions    Interactions: Interacted appropriately    Mental Status: Calm    Behavior/appearance: Attentive and Cooperative    Goals Achieved: Able to engage in interactions and Able to listen to others      Additional Notes:      Julian [name]

## 2019-11-18 NOTE — Progress Notes (Signed)
Problem: Suicide  Goal: *STG: Remains safe in hospital  Outcome: Progressing Towards Goal  Goal: *STG: Seeks staff when feelings of self harm or harm towards others arise  Outcome: Progressing Towards Goal  Goal: *STG: Attends activities and groups  Outcome: Progressing Towards Goal  Goal: *STG:  Verbalizes alternative ways of dealing with maladaptive feelings/behaviors  Outcome: Progressing Towards Goal  Goal: *STG/LTG: Complies with medication therapy  Outcome: Progressing Towards Goal  Goal: *STG/LTG: No longer expresses self destructive or suicidal thoughts  Outcome: Progressing Towards Goal  Goal: *LTG:  Identifies available community resources  Outcome: Progressing Towards Goal  Goal: *LTG:  Develops proactive suicide prevention plan  Outcome: Progressing Towards Goal  Goal: Interventions  Outcome: Progressing Towards Goal     Problem: Patient Education: Go to Patient Education Activity  Goal: Patient/Family Education  Outcome: Progressing Towards Goal     Problem: Depressed Mood (Adult/Pediatric)  Goal: *STG: Participates in treatment plan  Outcome: Progressing Towards Goal  Goal: *STG: Participates in 1:1 therapy sessions  Outcome: Progressing Towards Goal  Goal: *STG: Verbalizes anger, guilt, and other feelings in a constructive manor  Outcome: Progressing Towards Goal  Goal: *STG: Attends activities and groups  Outcome: Progressing Towards Goal  Goal: *STG: Demonstrates reduction in symptoms and increase in insight into coping skills/future focused  Outcome: Progressing Towards Goal  Goal: *STG: Remains safe in hospital  Outcome: Progressing Towards Goal  Goal: *STG: Complies with medication therapy  Outcome: Progressing Towards Goal  Goal: *LTG: Returns to previous level of functioning and participates with after care plan  Outcome: Progressing Towards Goal  Goal: *LTG: Understands illness and can identify signs of relapse  Outcome: Progressing Towards Goal  Goal: Interventions  Outcome: Progressing  Towards Goal

## 2019-11-18 NOTE — Behavioral Health Treatment Team (Signed)
PSYCHIATRIC PROGRESS NOTE         Patient Name  Julian [name]   Date of Birth [birth year]   CSN [id number]   Medical Record Number  [id number]      Age  59 y.o.   PCP Other, Phys, MD   Admit date:  11/15/2019    Room Number  325/01  @ [facility]   Date of Service  11/18/2019         E & M PROGRESS NOTE:         HISTORY       CC:  "suicidal ideation"  HISTORY OF PRESENT ILLNESS/INTERVAL HISTORY:  (reviewed/updated 11/18/2019).  per initial evaluation: The patient, Julian [name], is a 59 y.o.  BLACK/[place] male with a past psychiatric history significant for MDD, who presents at this time with complaints of (and/or evidence of) the following emotional symptoms: depression and suicidal thoughts/threats.  Additional symptomatology include family stressors.  The above symptoms have been present for 2+ days. These symptoms are of moderate to high severity. These symptoms are intermittent/ fleeting in nature.  The patient's condition has been precipitated by psychosocial stressors.  Patient's condition made worse by treatment noncompliance. UDS: negative; BAL=0.  ??  Per admission documentation, patient reported a desire to run into traffic leading to the current presentation he states that a family member urged him to come in. He reports conditional suicidality and denies prior hospitalizations.  ??  The patient is an unreliable historian. The patient corroborates the above narrative. The patient contracts for safety on the unit and gives consent for the team to contact collateral. The patient is amenable to initiating treatment while on the unit. The patient states that he stopped taking his Wellbutrin due to frustration with his family, he is evasive about the circumstances preceding this situation. The patient reports he lives alone but per SW assessment has been staying on couches.    4/04 - the patient slept 7.5 hours, remains irritable but calm and cooperative. Patient had a disagreement with his  roommate, and was moved to a different room where he is doing better. Patient denies active thoughts of self harm, but endorses passive SI and anxiety; depression persists. Patient medication compliant, able to make needs known.??    4/05 - no acute overnight events. Patient slept 6.5 hours, has been irritable with staff, endorsing ongoing conditional suicidality - he states that if discharged he will attempt to hurt himself. Patient reports anxiety and depression but denies active thoughts of self harm. Patient medication compliant, no PRNs given for agitation x24 hours. Patient smiling, cooperative with peers and in no apparent distress. He denies making conditionally suicidal statements.    4/06 - patient slept 7 hours overnight, he continues to endorse passive SI but is calm and pleasant, interacting appropriately with staff, attending groups. Patient irritable with nursing staff, reports ongoing anxiety but contracts for safety. SW working to ascertain his disposition options, as there is disagreement in the charts are to what his current housing situation is. Patient denies SI/HI/AVH/PI.        SIDE EFFECTS: (reviewed/updated 11/18/2019)  None reported or admitted to.     ALLERGIES:(reviewed/updated 11/18/2019)  Allergies   Allergen Reactions   ??? Ace Inhibitors Cough      MEDICATIONS PRIOR TO ADMISSION:(reviewed/updated 11/18/2019)  Medications Prior to Admission   Medication Sig   ??? buPROPion XL (WELLBUTRIN XL) 150 mg tablet Take 150 mg by mouth daily. Indications: major depressive disorder,  Patient reports last dose x 4 days ago.   ??? hydrOXYzine HCL (ATARAX) 50 mg tablet Take 50 mg by mouth two (2) times a day. Indications: anxious, Patient reports last dose x 4 days ago   ??? naproxen (NAPROSYN) 500 mg tablet Take 500 mg by mouth two (2) times daily as needed for Pain. Indications: pain   ??? hydrALAZINE (APRESOLINE) 50 mg tablet Take 50 mg by mouth two (2) times a day. Indications: high blood pressure, Patient  reports last dose x 2 weeks ago.   ??? losartan-hydroCHLOROthiazide (HYZAAR) 100-25 mg per tablet Take 1 Tab by mouth daily. Indications: high blood pressure, Patient reports last dose x 2 weeks ago   ??? pregabalin (LYRICA) 50 mg capsule Take 50 mg by mouth two (2) times a day. Indications: neuropathic pain   ??? polyethylene glycol (Miralax) 17 gram/dose powder Take 17 g by mouth daily. Indications: constipation   ??? atorvastatin (LIPITOR) 40 mg tablet Take 40 mg by mouth nightly. Indications: excessive fat in the blood   ??? hydrocortisone (CORTAID) 1 % topical cream Apply  to affected area two (2) times daily as needed for Skin Irritation. use thin layer   Indications: inflammation of the skin due to an allergy   ??? lidocaine (XYLOCAINE) 4 % topical cream Apply  to affected area two (2) times daily as needed for Pain. Indications: skin irritation   ??? green tea leaf extract (GREEN TEA PO) Take 500 mg by mouth. Indications: herbal supplement   ??? B-complex with vitamin C (VITAMIN B COMPLEX-C PO) Take 1 Tab by mouth. Indications: vitamin B deficiency   ??? Panax ginseng root (GINSENG KOREAN PO) Take 100 mg by mouth.   ??? eucalypt/men/camph/turp/wh.pet (MEDICATED CHEST RUB EX) by Apply Externally route as needed for Cough.   ??? diphenhydrAMINE-zinc acetate 2%-0.1% (BENADRYL) 2-0.1 % topical cream Apply  to affected area two (2) times daily as needed for Itching.   ??? guaiFENesin (ORGANIDIN) 400 mg tablet Take  by mouth every four (4) hours as needed for Congestion.   ??? ferrous sulfate 325 mg (65 mg iron) tablet Take  by mouth Daily (before breakfast). Indications: anemia from inadequate iron   ??? miconazole nitrate 2 % aerp by Apply Externally route as needed (athlete's foot). Indications: athlete's foot   ??? ginkgo biloba 120 mg tab Take 1 Tab by mouth. Indications: herbal supplement   ??? trolamine salicylate (Arthricream) 10 % topical cream Apply  to affected area as needed for Stiffness.   ??? methyl salicylate/menthol (BEN GAY EX)  by Apply Externally route as needed.   ??? acetaminophen (TYLENOL) 500 mg tablet Take 1 Tab by mouth every six (6) hours as needed for Pain. (Patient taking differently: Take 500 mg by mouth every six (6) hours as needed for Pain. Indications: pain)      PAST MEDICAL HISTORY: Past medical history from the initial psychiatric evaluation has been reviewed (reviewed/updated 11/18/2019) with no additional updates (I asked patient and no additional past medical history provided).   Past Medical History:   Diagnosis Date   ??? Anxiety    ??? Chronic constipation    ??? Diabetes (HCC)    ??? Hypertension    ??? Major depression    ??? OCD (obsessive compulsive disorder)    History reviewed. No pertinent surgical history.   SOCIAL HISTORY: Social history from the initial psychiatric evaluation has been reviewed (reviewed/updated 11/18/2019) with no additional updates (I asked patient and no additional social history provided).     Social History     Socioeconomic History   ??? Marital status: SINGLE     Spouse name: Not on file   ??? Number of children: Not on file   ??? Years of education: Not on file   ??? Highest education level: Not on file   Occupational History   ??? Not on file   Social Needs   ??? Financial resource strain: Not on file   ??? Food insecurity     Worry: Not on file     Inability: Not on file   ??? Transportation needs     Medical: Not on file     Non-medical: Not on file   Tobacco Use   ??? Smoking status: Never Smoker   ??? Smokeless tobacco: Never Used   Substance and Sexual Activity   ??? Alcohol use: No     Frequency: Never   ??? Drug use: No   ??? Sexual activity: Not Currently   Lifestyle   ??? Physical activity     Days per week: Not on file     Minutes per session: Not on file   ??? Stress: Not on file   Relationships   ??? Social Wellsite geologist on phone: Not on file     Gets together: Not on file     Attends religious service: Not on file     Active member of club or organization: Not on file     Attends meetings of clubs or  organizations: Not on file     Relationship status: Not on file   ??? Intimate partner violence     Fear of current or ex partner: Not on file     Emotionally abused: Not on file     Physically abused: Not on file     Forced sexual activity: Not on file   Other Topics Concern   ??? Not on file   Social History Narrative   ??? Not on file      FAMILY HISTORY: Family history from the initial psychiatric evaluation has been reviewed (reviewed/updated 11/18/2019) with no additional updates (I asked patient and no additional family history provided). History reviewed. No pertinent family history.    REVIEW OF SYSTEMS: (reviewed/updated 11/18/2019)  Appetite:improved   Sleep: no change   All other Review of Systems: Negative except per HPI         MENTAL STATUS EXAM & VITALS     MENTAL STATUS EXAM (MSE):    MSE FINDINGS ARE WITHIN NORMAL LIMITS (WNL) UNLESS OTHERWISE STATED BELOW. ( ALL OF THE BELOW CATEGORIES OF THE MSE HAVE BEEN REVIEWED (reviewed 11/18/2019) AND UPDATED AS DEEMED APPROPRIATE )  General Presentation age appropriate, cooperative and guarded   Orientation oriented to time, place and person   Vital Signs  See below (reviewed 11/18/2019); Vital Signs (BP, Pulse, & Temp) are within normal limits if not listed below.   Gait and Station Stable/steady, no ataxia   Musculoskeletal System No extrapyramidal symptoms (EPS); no abnormal muscular movements or Tardive Dyskinesia (TD); muscle strength and tone are within normal limits   Language No aphasia or dysarthria   Speech:  normal volume and non-pressured   Thought Processes logical; normal rate of thoughts; fair abstract reasoning/computation   Thought Associations goal directed   Thought Content preoccupations   Suicidal Ideations contracts for safety   Homicidal Ideations none   Mood:  depressed   Affect:  euthymic and mood-incongruent   Memory recent  intact  Memory remote:  intact   Concentration/Attention:  intact   Fund of Knowledge average   Insight:  limited    Reliability poor   Judgment:  fair          VITALS:     Patient Vitals for the past 24 hrs:   Temp Pulse Resp BP SpO2   11/18/19 1722 ??? 92 ??? (!) 150/81 ???   11/18/19 0733 98 ??F (36.7 ??C) 90 18 (!) 148/82 100 %     Wt Readings from Last 3 Encounters:   11/15/19 90.7 kg (200 lb)     Temp Readings from Last 3 Encounters:   11/18/19 98 ??F (36.7 ??C)   07/01/17 97.5 ??F (36.4 ??C)     BP Readings from Last 3 Encounters:   11/18/19 (!) 150/81   07/01/17 (!) 168/109     Pulse Readings from Last 3 Encounters:   11/18/19 92   07/01/17 78            DATA     LABORATORY DATA:(reviewed/updated 11/18/2019)  No results found for this or any previous visit (from the past 24 hour(s)).  No results found for: VALF2, VALAC, VALP, VALPR, DS6, CRBAM, CRBAMP, CARB2, XCRBAM  No results found for: LITHM   RADIOLOGY REPORTS:(reviewed/updated 11/18/2019)  No results found.       MEDICATIONS     ALL MEDICATIONS:   Current Facility-Administered Medications   Medication Dose Route Frequency   ??? OLANZapine (ZyPREXA) tablet 5 mg  5 mg Oral Q6H PRN   ??? haloperidol lactate (HALDOL) injection 5 mg  5 mg IntraMUSCular Q6H PRN   ??? benztropine (COGENTIN) tablet 1 mg  1 mg Oral BID PRN   ??? diphenhydrAMINE (BENADRYL) injection 50 mg  50 mg IntraMUSCular BID PRN   ??? hydrOXYzine HCL (ATARAX) tablet 50 mg  50 mg Oral TID PRN   ??? LORazepam (ATIVAN) injection 1 mg  1 mg IntraMUSCular Q4H PRN   ??? traZODone (DESYREL) tablet 50 mg  50 mg Oral QHS PRN   ??? magnesium hydroxide (MILK OF MAGNESIA) 400 mg/5 mL oral suspension 30 mL  30 mL Oral DAILY PRN   ??? hydrALAZINE (APRESOLINE) tablet 50 mg  50 mg Oral BID   ??? polyethylene glycol (MIRALAX) packet 17 g  17 g Oral DAILY   ??? naproxen (NAPROSYN) tablet 500 mg  500 mg Oral Q8H PRN   ??? buPROPion XL (WELLBUTRIN XL) tablet 150 mg  150 mg Oral QHS   ??? acetaminophen (TYLENOL) tablet 650 mg  650 mg Oral Q4H PRN   ??? losartan/hydroCHLOROthiazide (HYZAAR) 100/25 mg   Oral DAILY   ??? atorvastatin (LIPITOR) tablet 40 mg  40 mg Oral QHS    ??? pregabalin (LYRICA) capsule 100 mg  100 mg Oral DAILY PRN      SCHEDULED MEDICATIONS:   Current Facility-Administered Medications   Medication Dose Route Frequency   ??? hydrALAZINE (APRESOLINE) tablet 50 mg  50 mg Oral BID   ??? polyethylene glycol (MIRALAX) packet 17 g  17 g Oral DAILY   ??? buPROPion XL (WELLBUTRIN XL) tablet 150 mg  150 mg Oral QHS   ??? losartan/hydroCHLOROthiazide (HYZAAR) 100/25 mg   Oral DAILY   ??? atorvastatin (LIPITOR) tablet 40 mg  40 mg Oral QHS          ASSESSMENT & PLAN     DIAGNOSES REQUIRING ACTIVE TREATMENT AND MONITORING: (reviewed/updated 11/18/2019)  Patient Active Hospital Problem List:   MDD (major depressive disorder), recurrent episode,  moderate ([place]) (11/15/2019)    Assessment: patient with ongoing family stressors leading to hospitalization. No substances on board and patient denies prior episodes. He states that he went off antidepressant and is now feeling suicidal. Unclear housing situation and secondary gain may be a factor. Will work with patient to secure safe disposition plan.    Plan:  - CONTINUE Wellbutrin XL 150 mg QDAY for MDD  - IGM therapy as tolerated  - Expand database / obtain collateral  - Dispo planning    In summary, Julian [name], is a 59 y.o.  male who presents with a severe exacerbation of the principal diagnosis of MDD (major depressive disorder), recurrent episode, moderate ([place])    Patient's condition is improving.    Patient requires continued inpatient hospitalization for further stabilization, safety monitoring and medication management.  I will continue to coordinate the provision of individual, milieu, occupational, group, and substance abuse therapies to address target symptoms/diagnoses as deemed appropriate for the individual patient.  A coordinated, multidisplinary treatment team round was conducted with the patient (this team consists of the nurse, psychiatric unit pharmacist, Catering manager).     Complete current electronic health record  for patient has been reviewed today including consultant notes, ancillary staff notes, nurses and psychiatric tech notes.    Suicide risk assessment completed and patient deemed to be of low risk for suicide at this time.     The following regarding medications was addressed during rounds with patient:   the risks and benefits of the proposed medication. The patient was given the opportunity to ask questions. Informed consent given to the use of the above medications. Will continue to adjust psychiatric and non-psychiatric medications (see above "medication" section and orders section for details) as deemed appropriate & based upon diagnoses and response to treatment.     I will continue to order blood tests/labs and diagnostic tests as deemed appropriate and review results as they become available (see orders for details and above listed lab/test results).    I will order psychiatric records from previous psych hospitals to further elucidate the nature of patient's psychopathology and review once available.    I will gather additional collateral information from friends, family and o/p treatment team to further elucidate the nature of patient's psychopathology and baselline level of psychiatric functioning.         I certify that this patient's inpatient psychiatric hospital services furnished since the previous certification were, and continue to be, required for treatment that could reasonably be expected to improve the patient's condition, or for diagnostic study, and that the patient continues to need, on a daily basis, active treatment furnished directly by or requiring the supervision of inpatient psychiatric facility personnel. In addition, the hospital records show that services furnished were intensive treatment services, admission or related services, or equivalent services.    EXPECTED DISCHARGE DATE/DAY: 11/19/19     DISPOSITION: Home vs CSU       Signed By:   Raeford Razor, MD  11/18/2019

## 2019-11-18 NOTE — Suicide Safety Plan (Signed)
SAFETY PLAN    A suicide Safety Plan is a document that supports someone when they are having thoughts of suicide.    Warning Signs that indicate a suicidal crisis may be developing: What (situations, thoughts, feelings, body sensations, behaviors, etc.) do you experience that lets you know you are beginning to think about suicide?  1. Feeling nervous  2. Anxious   3. Feeling hopeless    Internal Coping Strategies:  What things can I do (relaxation techniques, hobbies, physical activities, etc.) to take my mind off my problems without contacting another person?  1. Singing and music   2. Exercise and meditation   3. Positive self-talk and pampering self     People and social settings that provide distraction: Who can I call or where can I go to distract me?  1. Name: Loretta Beggs- cousin  Phone:   2. Name: Herbert-friend  Phone:    3. Name: Kenneth Melvin  4. Place: [place]             People whom I can ask for help: Who can I call when I need help - for example, friends, family, clergy, someone else?  1. Name: Loretta Vaness- cousin  Phone:   2. Name: Herbert-friend  Phone:    3. Name: Kenneth Melvin    Professionals or Behavioral Health agencies I can contact during a crisis: Who can I call for help - for example, my doctor, my psychiatrist, my psychologist, a mental health provider, a suicide hotline?  1. Clinician Name: Dr. Abrams   Phone:       Clinician Pager or Emergency Contact #:     2. Clinician Name:  [facility]  Phone: [phone]      Clinician Pager or Emergency Contact #: [phone]    3. Suicide Prevention Lifeline: 1-800-273-TALK (8255)    4. Local Behavioral Health Emergency Services -  for example, [facility], local county suicide hotline, [employer] Hotline: 804-819-4000      Emergency Services Address: [address] [place] [address]      Emergency Services Phone: [phone]    Making the environment safe: How can I make my environment  (house/apartment/living space) safer? For example, can I remove guns, medications, and other items?  1. No safety concerns at this time

## 2019-11-18 NOTE — Behavioral Health Treatment Team (Signed)
Behavioral Health Treatment Team Note   ??  Patient goal(s) for today: Take medications as described.   Treatment team focus/goals: Medication adjustments.   Progress note: Pt was seen in treatment team this morning. Pt is alert and oriented. Pt denies SI/HI. Pt's mood is depressed, affect is WNL. Pt's thought process is coherent. Pt's insight and judgment is limited, reliability is poor.  Social work department will continue to coordinate discharge plans.   ??  LOS:  3                       Expected LOS:   ??  Financial concerns/prescription coverage: Blue [employer] Medicaid Part A&B with [employer] Extended Coverage and [employer] [facility] [place] Medicaid ??? Voluntary  Date of last family contact: None                      Family requesting physician contact today: No  Discharge plan: TBD  Guns in the home: None involved.                               Outpatient provider(s): unknown at this time  ??  Participating treatment team members: Ira D Lappe, Ali Flinn, MSW and Dr. Mbadugha, MD

## 2019-11-18 NOTE — Progress Notes (Signed)
Problem: Suicide  Goal: *STG: Attends activities and groups  Outcome: Progressing Towards Goal  Goal: *STG/LTG: Complies with medication therapy  Outcome: Progressing Towards Goal     Problem: Depressed Mood (Adult/Pediatric)  Goal: *STG: Remains safe in hospital  Outcome: Progressing Towards Goal     Problem: Falls - Risk of  Goal: *Absence of Falls  Description: Document Schmid Fall Risk and appropriate interventions in the flowsheet.  Outcome: Progressing Towards Goal  Note: Fall Risk Interventions:     Medication Interventions: Teach patient to arise slowly

## 2019-11-18 NOTE — Behavioral Health Treatment Team (Signed)
PSYCHIATRIC PROGRESS NOTE         Patient Name  Julian [name]   Date of Birth [birth year]   CSN [id number]   Medical Record Number  [id number]      Age  59 y.o.   PCP Other, Phys, MD   Admit date:  11/15/2019    Room Number  325/01  @ [facility]   Date of Service  11/18/2019         E & M PROGRESS NOTE:         HISTORY       CC:  "suicidal ideation"  HISTORY OF PRESENT ILLNESS/INTERVAL HISTORY:  (reviewed/updated 11/18/2019).  per initial evaluation: The patient, Julian [name], is a 59 y.o.  BLACK/AFR[place] AMER[place] male with a past psychiatric history significant for MDD, who presents at this time with complaints of (and/or evidence of) the following emotional symptoms: depression and suicidal thoughts/threats.  Additional symptomatology include family stressors.  The above symptoms have been present for 2+ days. These symptoms are of moderate to high severity. These symptoms are intermittent/ fleeting in nature.  The patient's condition has been precipitated by psychosocial stressors.  Patient's condition made worse by treatment noncompliance. UDS: negative; BAL=0.  ??  Per admission documentation, patient reported a desire to run into traffic leading to the current presentation he states that a family member urged him to come in. He reports conditional suicidality and denies prior hospitalizations.  ??  The patient is an unreliable historian. The patient corroborates the above narrative. The patient contracts for safety on the unit and gives consent for the team to contact collateral. The patient is amenable to initiating treatment while on the unit. The patient states that he stopped taking his Wellbutrin due to frustration with his family, he is evasive about the circumstances preceding this situation. The patient reports he lives alone but per SW assessment has been staying on couches.    4/04 - the patient slept 7.5 hours, remains irritable but calm and cooperative. Patient had a disagreement with his  roommate, and was moved to a different room where he is doing better. Patient denies active thoughts of self harm, but endorses passive SI and anxiety; depression persists. Patient medication compliant, able to make needs known.??    4/05 - no acute overnight events. Patient slept 6.5 hours, has been irritable with staff, endorsing ongoing conditional suicidality - he states that if discharged he will attempt to hurt himself. Patient reports anxiety and depression but denies active thoughts of self harm. Patient medication compliant, no PRNs given for agitation x24 hours. Patient smiling, cooperative with peers and in no apparent distress. He denies making conditionally suicidal statements.    4/06 - patient slept 7 hours overnight, he continues to endorse passive SI but is calm and pleasant, interacting appropriately with staff, attending groups. Patient irritable with nursing staff, reports ongoing anxiety but contracts for safety. SW working to ascertain his disposition options, as there is disagreement in the charts are to what his current housing situation is. Patient denies SI/HI/AVH/PI.        SIDE EFFECTS: (reviewed/updated 11/18/2019)  None reported or admitted to.     ALLERGIES:(reviewed/updated 11/18/2019)  Allergies   Allergen Reactions   ??? Ace Inhibitors Cough      MEDICATIONS PRIOR TO ADMISSION:(reviewed/updated 11/18/2019)  Medications Prior to Admission   Medication Sig   ??? buPROPion XL (WELLBUTRIN XL) 150 mg tablet Take 150 mg by mouth daily. Indications: major depressive disorder,  Patient reports last dose x 4 days ago.   ??? hydrOXYzine HCL (ATARAX) 50 mg tablet Take 50 mg by mouth two (2) times a day. Indications: anxious, Patient reports last dose x 4 days ago   ??? naproxen (NAPROSYN) 500 mg tablet Take 500 mg by mouth two (2) times daily as needed for Pain. Indications: pain   ??? hydrALAZINE (APRESOLINE) 50 mg tablet Take 50 mg by mouth two (2) times a day. Indications: high blood pressure, Patient  reports last dose x 2 weeks ago.   ??? losartan-hydroCHLOROthiazide (HYZAAR) 100-25 mg per tablet Take 1 Tab by mouth daily. Indications: high blood pressure, Patient reports last dose x 2 weeks ago   ??? pregabalin (LYRICA) 50 mg capsule Take 50 mg by mouth two (2) times a day. Indications: neuropathic pain   ??? polyethylene glycol (Miralax) 17 gram/dose powder Take 17 g by mouth daily. Indications: constipation   ??? atorvastatin (LIPITOR) 40 mg tablet Take 40 mg by mouth nightly. Indications: excessive fat in the blood   ??? hydrocortisone (CORTAID) 1 % topical cream Apply  to affected area two (2) times daily as needed for Skin Irritation. use thin layer   Indications: inflammation of the skin due to an allergy   ??? lidocaine (XYLOCAINE) 4 % topical cream Apply  to affected area two (2) times daily as needed for Pain. Indications: skin irritation   ??? green tea leaf extract (GREEN TEA PO) Take 500 mg by mouth. Indications: herbal supplement   ??? B-complex with vitamin C (VITAMIN B COMPLEX-C PO) Take 1 Tab by mouth. Indications: vitamin B deficiency   ??? Panax ginseng root (GINSENG KOREAN PO) Take 100 mg by mouth.   ??? eucalypt/men/camph/turp/wh.pet (MEDICATED CHEST RUB EX) by Apply Externally route as needed for Cough.   ??? diphenhydrAMINE-zinc acetate 2%-0.1% (BENADRYL) 2-0.1 % topical cream Apply  to affected area two (2) times daily as needed for Itching.   ??? guaiFENesin (ORGANIDIN) 400 mg tablet Take  by mouth every four (4) hours as needed for Congestion.   ??? ferrous sulfate 325 mg (65 mg iron) tablet Take  by mouth Daily (before breakfast). Indications: anemia from inadequate iron   ??? miconazole nitrate 2 % aerp by Apply Externally route as needed (athlete's foot). Indications: athlete's foot   ??? ginkgo biloba 120 mg tab Take 1 Tab by mouth. Indications: herbal supplement   ??? trolamine salicylate (Arthricream) 10 % topical cream Apply  to affected area as needed for Stiffness.   ??? methyl salicylate/menthol (BEN GAY EX)  by Apply Externally route as needed.   ??? acetaminophen (TYLENOL) 500 mg tablet Take 1 Tab by mouth every six (6) hours as needed for Pain. (Patient taking differently: Take 500 mg by mouth every six (6) hours as needed for Pain. Indications: pain)      PAST MEDICAL HISTORY: Past medical history from the initial psychiatric evaluation has been reviewed (reviewed/updated 11/18/2019) with no additional updates (I asked patient and no additional past medical history provided).   Past Medical History:   Diagnosis Date   ??? Anxiety    ??? Chronic constipation    ??? Diabetes (HCC)    ??? Hypertension    ??? Major depression    ??? OCD (obsessive compulsive disorder)    History reviewed. No pertinent surgical history.   SOCIAL HISTORY: Social history from the initial psychiatric evaluation has been reviewed (reviewed/updated 11/18/2019) with no additional updates (I asked patient and no additional social history provided).  Social History     Socioeconomic History   ??? Marital status: SINGLE     Spouse name: Not on file   ??? Number of children: Not on file   ??? Years of education: Not on file   ??? Highest education level: Not on file   Occupational History   ??? Not on file   Social Needs   ??? Financial resource strain: Not on file   ??? Food insecurity     Worry: Not on file     Inability: Not on file   ??? Transportation needs     Medical: Not on file     Non-medical: Not on file   Tobacco Use   ??? Smoking status: Never Smoker   ??? Smokeless tobacco: Never Used   Substance and Sexual Activity   ??? Alcohol use: No     Frequency: Never   ??? Drug use: No   ??? Sexual activity: Not Currently   Lifestyle   ??? Physical activity     Days per week: Not on file     Minutes per session: Not on file   ??? Stress: Not on file   Relationships   ??? Social Wellsite geologist on phone: Not on file     Gets together: Not on file     Attends religious service: Not on file     Active member of club or organization: Not on file     Attends meetings of clubs or  organizations: Not on file     Relationship status: Not on file   ??? Intimate partner violence     Fear of current or ex partner: Not on file     Emotionally abused: Not on file     Physically abused: Not on file     Forced sexual activity: Not on file   Other Topics Concern   ??? Not on file   Social History Narrative   ??? Not on file      FAMILY HISTORY: Family history from the initial psychiatric evaluation has been reviewed (reviewed/updated 11/18/2019) with no additional updates (I asked patient and no additional family history provided). History reviewed. No pertinent family history.    REVIEW OF SYSTEMS: (reviewed/updated 11/18/2019)  Appetite:improved   Sleep: no change   All other Review of Systems: Negative except per HPI         MENTAL STATUS EXAM & VITALS     MENTAL STATUS EXAM (MSE):    MSE FINDINGS ARE WITHIN NORMAL LIMITS (WNL) UNLESS OTHERWISE STATED BELOW. ( ALL OF THE BELOW CATEGORIES OF THE MSE HAVE BEEN REVIEWED (reviewed 11/18/2019) AND UPDATED AS DEEMED APPROPRIATE )  General Presentation age appropriate, cooperative and guarded   Orientation oriented to time, place and person   Vital Signs  See below (reviewed 11/18/2019); Vital Signs (BP, Pulse, & Temp) are within normal limits if not listed below.   Gait and Station Stable/steady, no ataxia   Musculoskeletal System No extrapyramidal symptoms (EPS); no abnormal muscular movements or Tardive Dyskinesia (TD); muscle strength and tone are within normal limits   Language No aphasia or dysarthria   Speech:  normal volume and non-pressured   Thought Processes logical; normal rate of thoughts; fair abstract reasoning/computation   Thought Associations goal directed   Thought Content preoccupations   Suicidal Ideations contracts for safety   Homicidal Ideations none   Mood:  depressed   Affect:  euthymic and mood-incongruent   Memory recent  intact  Memory remote:  intact   Concentration/Attention:  intact   Fund of Knowledge average   Insight:  limited    Reliability poor   Judgment:  fair          VITALS:     Patient Vitals for the past 24 hrs:   Temp Pulse Resp BP SpO2   11/18/19 1722 ??? 92 ??? (!) 150/81 ???   11/18/19 0733 98 ??F (36.7 ??C) 90 18 (!) 148/82 100 %     Wt Readings from Last 3 Encounters:   11/15/19 90.7 kg (200 lb)     Temp Readings from Last 3 Encounters:   11/18/19 98 ??F (36.7 ??C)   07/01/17 97.5 ??F (36.4 ??C)     BP Readings from Last 3 Encounters:   11/18/19 (!) 150/81   07/01/17 (!) 168/109     Pulse Readings from Last 3 Encounters:   11/18/19 92   07/01/17 78            DATA     LABORATORY DATA:(reviewed/updated 11/18/2019)  No results found for this or any previous visit (from the past 24 hour(s)).  No results found for: VALF2, VALAC, VALP, VALPR, DS6, CRBAM, CRBAMP, CARB2, XCRBAM  No results found for: LITHM   RADIOLOGY REPORTS:(reviewed/updated 11/18/2019)  No results found.       MEDICATIONS     ALL MEDICATIONS:   Current Facility-Administered Medications   Medication Dose Route Frequency   ??? OLANZapine (ZyPREXA) tablet 5 mg  5 mg Oral Q6H PRN   ??? haloperidol lactate (HALDOL) injection 5 mg  5 mg IntraMUSCular Q6H PRN   ??? benztropine (COGENTIN) tablet 1 mg  1 mg Oral BID PRN   ??? diphenhydrAMINE (BENADRYL) injection 50 mg  50 mg IntraMUSCular BID PRN   ??? hydrOXYzine HCL (ATARAX) tablet 50 mg  50 mg Oral TID PRN   ??? LORazepam (ATIVAN) injection 1 mg  1 mg IntraMUSCular Q4H PRN   ??? traZODone (DESYREL) tablet 50 mg  50 mg Oral QHS PRN   ??? magnesium hydroxide (MILK OF MAGNESIA) 400 mg/5 mL oral suspension 30 mL  30 mL Oral DAILY PRN   ??? hydrALAZINE (APRESOLINE) tablet 50 mg  50 mg Oral BID   ??? polyethylene glycol (MIRALAX) packet 17 g  17 g Oral DAILY   ??? naproxen (NAPROSYN) tablet 500 mg  500 mg Oral Q8H PRN   ??? buPROPion XL (WELLBUTRIN XL) tablet 150 mg  150 mg Oral QHS   ??? acetaminophen (TYLENOL) tablet 650 mg  650 mg Oral Q4H PRN   ??? losartan/hydroCHLOROthiazide (HYZAAR) 100/25 mg   Oral DAILY   ??? atorvastatin (LIPITOR) tablet 40 mg  40 mg Oral QHS    ??? pregabalin (LYRICA) capsule 100 mg  100 mg Oral DAILY PRN      SCHEDULED MEDICATIONS:   Current Facility-Administered Medications   Medication Dose Route Frequency   ??? hydrALAZINE (APRESOLINE) tablet 50 mg  50 mg Oral BID   ??? polyethylene glycol (MIRALAX) packet 17 g  17 g Oral DAILY   ??? buPROPion XL (WELLBUTRIN XL) tablet 150 mg  150 mg Oral QHS   ??? losartan/hydroCHLOROthiazide (HYZAAR) 100/25 mg   Oral DAILY   ??? atorvastatin (LIPITOR) tablet 40 mg  40 mg Oral QHS          ASSESSMENT & PLAN     DIAGNOSES REQUIRING ACTIVE TREATMENT AND MONITORING: (reviewed/updated 11/18/2019)  Patient Active Hospital Problem List:   MDD (major depressive disorder), recurrent episode,  moderate ([place]) (11/15/2019)    Assessment: patient with ongoing family stressors leading to hospitalization. No substances on board and patient denies prior episodes. He states that he went off antidepressant and is now feeling suicidal. Unclear housing situation and secondary gain may be a factor. Will work with patient to secure safe disposition plan.    Plan:  - CONTINUE Wellbutrin XL 150 mg QDAY for MDD  - IGM therapy as tolerated  - Expand database / obtain collateral  - Dispo planning    In summary, JET ARMBRUST, is a 59 y.o.  male who presents with a severe exacerbation of the principal diagnosis of MDD (major depressive disorder), recurrent episode, moderate ([place])    Patient's condition is improving.    Patient requires continued inpatient hospitalization for further stabilization, safety monitoring and medication management.  I will continue to coordinate the provision of individual, milieu, occupational, group, and substance abuse therapies to address target symptoms/diagnoses as deemed appropriate for the individual patient.  A coordinated, multidisplinary treatment team round was conducted with the patient (this team consists of the nurse, psychiatric unit pharmacist, Catering manager).     Complete current electronic health record  for patient has been reviewed today including consultant notes, ancillary staff notes, nurses and psychiatric tech notes.    Suicide risk assessment completed and patient deemed to be of low risk for suicide at this time.     The following regarding medications was addressed during rounds with patient:   the risks and benefits of the proposed medication. The patient was given the opportunity to ask questions. Informed consent given to the use of the above medications. Will continue to adjust psychiatric and non-psychiatric medications (see above "medication" section and orders section for details) as deemed appropriate & based upon diagnoses and response to treatment.     I will continue to order blood tests/labs and diagnostic tests as deemed appropriate and review results as they become available (see orders for details and above listed lab/test results).    I will order psychiatric records from previous psych hospitals to further elucidate the nature of patient's psychopathology and review once available.    I will gather additional collateral information from friends, family and o/p treatment team to further elucidate the nature of patient's psychopathology and baselline level of psychiatric functioning.         I certify that this patient's inpatient psychiatric hospital services furnished since the previous certification were, and continue to be, required for treatment that could reasonably be expected to improve the patient's condition, or for diagnostic study, and that the patient continues to need, on a daily basis, active treatment furnished directly by or requiring the supervision of inpatient psychiatric facility personnel. In addition, the hospital records show that services furnished were intensive treatment services, admission or related services, or equivalent services.    EXPECTED DISCHARGE DATE/DAY: 11/19/19     DISPOSITION: Home vs CSU       Signed By:   Raeford Razor, MD  11/18/2019

## 2019-11-18 NOTE — Behavioral Health Treatment Team (Signed)
SAFETY PLAN    A suicide Safety Plan is a document that supports someone when they are having thoughts of suicide.    Warning Signs that indicate a suicidal crisis may be developing: What (situations, thoughts, feelings, body sensations, behaviors, etc.) do you experience that lets you know you are beginning to think about suicide?  1. Feeling nervous  2. Anxious   3. Feeling hopeless    Internal Coping Strategies:  What things can I do (relaxation techniques, hobbies, physical activities, etc.) to take my mind off my problems without contacting another person?  1. Singing and music   2. Exercise and meditation   3. Positive self-talk and pampering self     People and social settings that provide distraction: Who can I call or where can I go to distract me?  1. Name: Camrynn Macari- cousin  Phone:   2. Name: Herbert-friend  Phone:    3. Name: Danton Clap  4. Place: [facility] [address] whom I can ask for help: Who can I call when I need help - for example, friends, family, clergy, someone else?  1. Name: Hodari Mullally- cousin  Phone:   2. Name: Herbert-friend  Phone:    3. Name: Danton Clap    Professionals or Behavioral Health agencies I can contact during a crisis: Who can I call for help - for example, my doctor, my psychiatrist, my psychologist, a mental health provider, a suicide hotline?  1. Clinician Name: Dr. Orpah Greek   Phone:       Clinician Pager or Emergency Contact #:     2. Clinician Name:  Clyde Lundborg  Phone: [phone]      Clinician Pager or Emergency Contact #: [phone]    3. Suicide Prevention Lifeline: 1-800-273-TALK (8255)    4. Local Behavioral Health Emergency Services -  for example, [facility], local county suicide hotline, [place] Hotline: (913)732-6849      Emergency Services Address: [address] [place], [place] [address]      Emergency Services Phone: [phone]    Making the environment safe: How can I make my environment  (house/apartment/living space) safer? For example, can I remove guns, medications, and other items?  1. No safety concerns at this time

## 2019-11-18 NOTE — Behavioral Health Treatment Team (Signed)
 GROUP THERAPY PROGRESS NOTE    Patient is participating in Coping Skills group.     Group time: 50 minutes    Personal goal for participation: To identify coping skills to combat feeling misunderstood by society and support system    Goal orientation: Personal    Group therapy participation: active    Therapeutic interventions reviewed and discussed: Patient's completed a worksheet titled "The Masks We Wear" depicting how the world perceives them vs true inner thoughts, feelings, and dialogue. Patient's discussed ways in which the they feel misunderstood by the world and how they can more effectively communicate their mental health needs to society and their support systems. Patient's identified coping skills to combat disparity between world's view of patient and their view of self. Patient's processed the ways in which wearing a metaphorical mask can be both beneficial and damaging.    Impression of participation: Pt presented with euthymic mood, clear speech, logical thought process, calm and cooperative, interacted appropriately with others. Pt completed activity, stated he tries to hide how he is feeling behind a mask but his support system often sees past the facade. Pt processed desire to be more true to self, to express feelings in a healthy way, to have self-confidence even if people are negative around him.     Caroline Lavoie, MSW

## 2019-11-18 NOTE — Behavioral Health Treatment Team (Signed)
0800 Report given by [place] RN. pt is up in room.VSS.    1000 pt is visible on the unit. socializing with peers. Medication and meal compliant. Pt denies si/hi/a/v hall. States he has depression 4/10 and anxiety 6/10. Pt is smiling and requires redirection at times.     1200 pt ate lunch. Visible on the unit socializing with peers.     1400 pt is visible on the unit.     1600 pt is visible on the unit. Took scheduled medications.

## 2019-11-18 NOTE — Progress Notes (Signed)
Problem: Suicide  Goal: *STG: Attends activities and groups  Outcome: Progressing Towards Goal  Goal: *STG/LTG: Complies with medication therapy  Outcome: Progressing Towards Goal     Problem: Depressed Mood (Adult/Pediatric)  Goal: *STG: Remains safe in hospital  Outcome: Progressing Towards Goal     Problem: Falls - Risk of  Goal: *Absence of Falls  Description: Document Schmid Fall Risk and appropriate interventions in the flowsheet.  Outcome: Progressing Towards Goal  Note: Fall Risk Interventions:     Medication Interventions: Teach patient to arise slowly

## 2019-11-18 NOTE — Behavioral Health Treatment Team (Signed)
 Behavioral Health Treatment Team Note     Patient goal(s) for today: Take medications as described.   Treatment team focus/goals: Medication adjustments.   Progress note: Pt was seen in treatment team this morning. Pt is alert and oriented. Pt denies SI/HI. Pt's mood is depressed, affect is WNL. Pt's thought process is coherent. Pt's insight and judgment is limited, reliability is poor.  Social work department will continue to coordinate discharge plans.     LOS:  3                       Expected LOS:     Financial concerns/prescription coverage: [employer] Medicaid Part A&B with QMB Extended Coverage and [employer] [facility] [employer] [facility] [place] Medicaid - Voluntary  Date of last family contact: None                      Family requesting physician contact today: No  Discharge plan: TBD  Guns in the home: None involved.                               Outpatient provider(s): unknown at this time    Participating treatment team members: Daril JONETTA Pa, Ali Flinn, MSW and Dr. Debbie, MD

## 2019-11-18 NOTE — Progress Notes (Signed)
Problem: Suicide  Goal: *STG: Remains safe in hospital  Outcome: Progressing Towards Goal  Goal: *STG: Seeks staff when feelings of self harm or harm towards others arise  Outcome: Progressing Towards Goal  Goal: *STG: Attends activities and groups  Outcome: Progressing Towards Goal  Goal: *STG:  Verbalizes alternative ways of dealing with maladaptive feelings/behaviors  Outcome: Progressing Towards Goal  Goal: *STG/LTG: Complies with medication therapy  Outcome: Progressing Towards Goal  Goal: *STG/LTG: No longer expresses self destructive or suicidal thoughts  Outcome: Progressing Towards Goal  Goal: *LTG:  Identifies available community resources  Outcome: Progressing Towards Goal  Goal: *LTG:  Develops proactive suicide prevention plan  Outcome: Progressing Towards Goal  Goal: Interventions  Outcome: Progressing Towards Goal     Problem: Patient Education: Go to Patient Education Activity  Goal: Patient/Family Education  Outcome: Progressing Towards Goal     Problem: Depressed Mood (Adult/Pediatric)  Goal: *STG: Participates in treatment plan  Outcome: Progressing Towards Goal  Goal: *STG: Participates in 1:1 therapy sessions  Outcome: Progressing Towards Goal  Goal: *STG: Verbalizes anger, guilt, and other feelings in a constructive manor  Outcome: Progressing Towards Goal  Goal: *STG: Attends activities and groups  Outcome: Progressing Towards Goal  Goal: *STG: Demonstrates reduction in symptoms and increase in insight into coping skills/future focused  Outcome: Progressing Towards Goal  Goal: *STG: Remains safe in hospital  Outcome: Progressing Towards Goal  Goal: *STG: Complies with medication therapy  Outcome: Progressing Towards Goal  Goal: *LTG: Returns to previous level of functioning and participates with after care plan  Outcome: Progressing Towards Goal  Goal: *LTG: Understands illness and can identify signs of relapse  Outcome: Progressing Towards Goal  Goal: Interventions  Outcome: Progressing  Towards Goal

## 2019-11-19 MED ORDER — HYDROCHLOROTHIAZIDE 25 MG TAB
25 mg | Freq: Every day | ORAL | Status: DC
Start: 2019-11-19 — End: 2019-11-21
  Administered 2019-11-20 – 2019-11-21 (×2): via ORAL

## 2019-11-19 MED ORDER — LOSARTAN 25 MG TAB
25 mg | Freq: Every day | ORAL | Status: DC
Start: 2019-11-19 — End: 2019-11-21
  Administered 2019-11-20 – 2019-11-21 (×2): via ORAL

## 2019-11-19 MED FILL — BUPROPION XL 150 MG 24 HR TAB: 150 mg | ORAL | Qty: 1

## 2019-11-19 MED FILL — ATORVASTATIN 40 MG TAB: 40 mg | ORAL | Qty: 1

## 2019-11-19 MED FILL — LOSARTAN 50 MG TAB: 50 mg | ORAL | Qty: 2

## 2019-11-19 MED FILL — HYDRALAZINE 50 MG TAB: 50 mg | ORAL | Qty: 1

## 2019-11-19 MED FILL — HEALTHYLAX 17 GRAM ORAL POWDER PACKET: 17 gram | ORAL | Qty: 1

## 2019-11-19 MED FILL — HYDROCHLOROTHIAZIDE 25 MG TAB: 25 mg | ORAL | Qty: 1

## 2019-11-19 NOTE — Progress Notes (Addendum)
Problem: Suicide  Goal: *STG: Remains safe in hospital  Outcome: Progressing Towards Goal     Problem: Depressed Mood (Adult/Pediatric)  Goal: *STG: Participates in treatment plan  Outcome: Progressing Towards Goal  Goal: *STG: Complies with medication therapy  Outcome: Progressing Towards Goal     0700: Shift change report given to Abigail K. (oncoming nurse) by Linda P. (offgoing nurse). Report included the following information SBAR, Kardex, MAR and Recent Results.     0800-1000: Assumed care of patient. Patient endorsed anxiety 3/10 and SI. Patient stated the thoughts "comes and goes, in intervals." Patient then went on to state that he is "getting better" and will be safe here in the hospital. Patient med and meal compliant. Patient to dayroom for groups and meals.     1000-1200: Patient awake in bed, resting quietly.     1200-1400: Patient to dayroom for meals. Patient sleeping in bed. No signs of distress noted.    1400-1600: Patient sitting quietly in dayroom coloring. No voiced concerns.    1600-1800: Patient to dayroom for meals. Another patient playfully touched the patient and the patient began yelling, which then caused the other patient to shout back. Writer and tech in hallway diffused the situation. Writer needed BP taken before scheduled Hydralazine. Pt kept moving while cuff was on. BP 153/141. Patient refused writer to retake. Began yelling at writer. Given medication.    1800-1900: Patient in dayroom sitting quietly. No voiced concerns.

## 2019-11-19 NOTE — Behavioral Health Treatment Team (Addendum)
GROUP THERAPY PROGRESS NOTE    Patient is participating in Discharge Planning Group.     Group time: 30 minutes    Personal goal for participation: Process feelings related to discharge and/or feelings/goals for today.    Goal orientation: Personal    Group therapy participation: passive    Therapeutic interventions reviewed and discussed: Group discussion was focused on discharge plans and anxiety related to this. Group members discussed what they planned to do once discharge and discharge plans. Patients discussed their goals for today as well as the weekend and what they are working on with treatment team to get ready for discharge.    Impression of participation: Julian [name] was present in group but did not share. He was observed sitting at a table separate from group and was coloring. He stepped out for treatment team and did not return.    Christine Desiderati BA, MA Internship Student   Melissa Schall LPC LSATP [facility]

## 2019-11-19 NOTE — Progress Notes (Addendum)
Patient received sitting in the day room watching TV.  When attempting to assess patient this evening he responded <<I'm not going to talk about that now>>.  Calm, watching TV in the day room throughout the evening, no interactions with his peers.  Taking medications as prescribed.  Will continue to monitor for safety.    Patient appeared to sleep 7 hours.

## 2019-11-19 NOTE — Behavioral Health Treatment Team (Signed)
GROUP THERAPY PROGRESS NOTE     Patient is participating in Substance abuse/Coping Skills group.    Group time: 50 minutes     Personal goal for participation: To understand values and how behaviors impact values     Goal orientation: Personal     Group therapy participation: passive     Therapeutic interventions reviewed and discussed: Group discussion of values and how behaviors and actions have been in agreement or disagreement with values. Patients able to share their thoughts on values and value related questions designed for patient???s to explore their values and beliefs.     Impression of participation: Julian [name] was present in the day room, but did not participate in group discussion.    Christine Desiderati BA, MA Internship Student  Melissa Schall LPC LSATP [facility]

## 2019-11-19 NOTE — Group Note (Cosign Needed)
IP BH GROUP DOCUMENTATION INDIVIDUAL                                                                          Group Therapy Note    Date: 11/19/2019    Group Start Time: 0900  Group End Time: 1000  Group Topic: Topic Group    [facility] ACUTE [facility]    Baker, Beverly S    IP BH GROUP DOCUMENTATION GROUP    Group Therapy Note    Attendees: 7         Attendance: Attended    Patient's Goal:  To participate in mental health journey game    Interventions/techniques: Supported-choices in recovery    Follows Directions:     Interactions: Did not interact appropriately    Mental Status: Flat and Worried    Behavior/appearance: Needed prompting    Goals Achieved: attended session      Additional Notes:  Worried,flat affect, declined active participation    [address]

## 2019-11-19 NOTE — Behavioral Health Treatment Team (Signed)
GROUP THERAPY PROGRESS NOTE    Patient is participating in a Coping Skills Group     Group time: 1 hour    Personal goal for participation: To discuss the impact of negative self-talk on mental health and practice positive affirmations to improve mood and view of self    Goal orientation: Personal    Group therapy participation: passive    Therapeutic interventions reviewed and discussed: Patient discussed the impact of negative self-talk on mental health. Patients discussed and wrote down self-defeating statements they make to themselves. Social worker provided psychoeducation on reframing negative-self talk into positive affirmations. Patients completed exercise where they reframed their self-defeating statements into positive affirmations. Patients listened to soothing music and used watercolors to meditate on and paint a positive affirmation they desire to speak over themselves.     Impression of participation: Pt present in group room throughout group session, sitting at table in the corner coloring. Pt encouraged to come paint and join group, pt declined. Pt calm and quiet.    Caroline Lavoie, MSW

## 2019-11-19 NOTE — Group Note (Cosign Needed)
IP BH GROUP DOCUMENTATION INDIVIDUAL                                                                          Group Therapy Note    Date: 11/19/2019    Group Start Time: 1100  Group End Time: 1200  Group Topic: Topic Group    [facility]    Baker, Beverly S    IP BH GROUP DOCUMENTATION GROUP    Group Therapy Note    Attendees: 4         Attendance: Did not attend    Patient's Goal:      Interventions/techniques:Beverly S Baker

## 2019-11-19 NOTE — Group Note (Cosign Needed)
IP BH GROUP DOCUMENTATION INDIVIDUAL                                                                          Group Therapy Note    Date: 11/19/2019    Group Start Time: 1500  Group End Time: 1600  Group Topic: Recreational/Music Therapy    [facility]    [name], Julian S    IP BH GROUP DOCUMENTATION GROUP    Group Therapy Note    Attendees: 6         Attendance: Attended    Patient's Goal:  To concentrate on selected task    Interventions/techniques: Supported-crafts,games,music    Follows Directions:     Interactions: Interacted appropriately    Mental Status: Calm    Behavior/appearance: Needed prompting    Goals Achieved: Able to engage in interactions and Able to listen to others      Additional Notes:  Pt left session early    Audelia Hives

## 2019-11-19 NOTE — Progress Notes (Signed)
Patient actively participated in spirituality group on November 19, 2019 on General Behavioral Health unit.      Geri Jones, MPS, BCC, Staff Chaplain  [facility]    Chaplain Paging Service  [phone]

## 2019-11-19 NOTE — Progress Notes (Signed)
Problem: Suicide  Goal: *STG: Remains safe in hospital  Outcome: Progressing Towards Goal     Problem: Depressed Mood (Adult/Pediatric)  Goal: *STG: Complies with medication therapy  Outcome: Progressing Towards Goal     Problem: Falls - Risk of  Goal: *Absence of Falls  Description: Document Schmid Fall Risk and appropriate interventions in the flowsheet.  Outcome: Progressing Towards Goal  Note: Fall Risk Interventions:    Medication Interventions: Teach patient to arise slowly

## 2019-11-19 NOTE — Progress Notes (Addendum)
Patient received sitting in the day room watching TV with his peers.  Endorses passive SI with no plan but states that it is <<getting better>>, depression and anxiety.  Denies HI/AVH.  Calm, cooperative, smiling, interacting with his peers, attending groups.  Taking medications as prescribed.  Will continue to monitor for safety.    Patient appeared to sleep 7.5 hours.

## 2019-11-19 NOTE — Behavioral Health Treatment Team (Signed)
PSYCHIATRIC PROGRESS NOTE         Patient Name  Julian [name]   Date of Birth 12/26/1960   CSN [id number]   Medical Record Number  [id number]      Age  59 y.o.   PCP Other, Phys, MD   Admit date:  11/15/2019    Room Number  325/01  @ [facility]   Date of Service  11/19/2019         E & M PROGRESS NOTE:         HISTORY       CC:  "suicidal ideation"  HISTORY OF PRESENT ILLNESS/INTERVAL HISTORY:  (reviewed/updated 11/19/2019).  per initial evaluation: The patient, Julian [name], is a 59 y.o.  BLACK/[place] male with a past psychiatric history significant for MDD, who presents at this time with complaints of (and/or evidence of) the following emotional symptoms: depression and suicidal thoughts/threats.  Additional symptomatology include family stressors.  The above symptoms have been present for 2+ days. These symptoms are of moderate to high severity. These symptoms are intermittent/ fleeting in nature.  The patient's condition has been precipitated by psychosocial stressors.  Patient's condition made worse by treatment noncompliance. UDS: negative; BAL=0.  ??  Per admission documentation, patient reported a desire to run into traffic leading to the current presentation he states that a family member urged him to come in. He reports conditional suicidality and denies prior hospitalizations.  ??  The patient is an unreliable historian. The patient corroborates the above narrative. The patient contracts for safety on the unit and gives consent for the team to contact collateral. The patient is amenable to initiating treatment while on the unit. The patient states that he stopped taking his Wellbutrin due to frustration with his family, he is evasive about the circumstances preceding this situation. The patient reports he lives alone but per SW assessment has been staying on couches.    4/04 - the patient slept 7.5 hours, remains irritable but calm and cooperative. Patient had a disagreement with his  roommate, and was moved to a different room where he is doing better. Patient denies active thoughts of self harm, but endorses passive SI and anxiety; depression persists. Patient medication compliant, able to make needs known.??    4/05 - no acute overnight events. Patient slept 6.5 hours, has been irritable with staff, endorsing ongoing conditional suicidality - he states that if discharged he will attempt to hurt himself. Patient reports anxiety and depression but denies active thoughts of self harm. Patient medication compliant, no PRNs given for agitation x24 hours. Patient smiling, cooperative with peers and in no apparent distress. He denies making conditionally suicidal statements.    4/06 - patient slept 7 hours overnight, he continues to endorse passive SI but is calm and pleasant, interacting appropriately with staff, attending groups. Patient irritable with nursing staff, reports ongoing anxiety but contracts for safety. SW working to ascertain his disposition options, as there is disagreement in the charts are to what his current housing situation is. Patient denies SI/HI/AVH/PI.    4/07 - no acute overnight events. Patient visible, pleasant, cooperative with treatment. He continues to endorse SI, passive and intermittent, but acknowledges on interview that he has been transient and is hoping to have more help with housing. Patient given 48 hours notice, plan to discharge to shelter Friday. Patient given housing resources, voices understanding.        SIDE EFFECTS: (reviewed/updated 11/19/2019)  None reported or admitted  to.     ALLERGIES:(reviewed/updated 11/19/2019)  Allergies   Allergen Reactions   ??? Ace Inhibitors Cough      MEDICATIONS PRIOR TO ADMISSION:(reviewed/updated 11/19/2019)  Medications Prior to Admission   Medication Sig   ??? buPROPion XL (WELLBUTRIN XL) 150 mg tablet Take 150 mg by mouth daily. Indications: major depressive disorder, Patient reports last dose x 4 days ago.   ??? hydrOXYzine HCL  (ATARAX) 50 mg tablet Take 50 mg by mouth two (2) times a day. Indications: anxious, Patient reports last dose x 4 days ago   ??? naproxen (NAPROSYN) 500 mg tablet Take 500 mg by mouth two (2) times daily as needed for Pain. Indications: pain   ??? hydrALAZINE (APRESOLINE) 50 mg tablet Take 50 mg by mouth two (2) times a day. Indications: high blood pressure, Patient reports last dose x 2 weeks ago.   ??? losartan-hydroCHLOROthiazide (HYZAAR) 100-25 mg per tablet Take 1 Tab by mouth daily. Indications: high blood pressure, Patient reports last dose x 2 weeks ago   ??? pregabalin (LYRICA) 50 mg capsule Take 50 mg by mouth two (2) times a day. Indications: neuropathic pain   ??? polyethylene glycol (Miralax) 17 gram/dose powder Take 17 g by mouth daily. Indications: constipation   ??? atorvastatin (LIPITOR) 40 mg tablet Take 40 mg by mouth nightly. Indications: excessive fat in the blood   ??? hydrocortisone (CORTAID) 1 % topical cream Apply  to affected area two (2) times daily as needed for Skin Irritation. use thin layer   Indications: inflammation of the skin due to an allergy   ??? lidocaine (XYLOCAINE) 4 % topical cream Apply  to affected area two (2) times daily as needed for Pain. Indications: skin irritation   ??? green tea leaf extract (GREEN TEA PO) Take 500 mg by mouth. Indications: herbal supplement   ??? B-complex with vitamin C (VITAMIN B COMPLEX-C PO) Take 1 Tab by mouth. Indications: vitamin B deficiency   ??? Panax ginseng root (GINSENG KOREAN PO) Take 100 mg by mouth.   ??? eucalypt/men/camph/turp/wh.pet (MEDICATED CHEST RUB EX) by Apply Externally route as needed for Cough.   ??? diphenhydrAMINE-zinc acetate 2%-0.1% (BENADRYL) 2-0.1 % topical cream Apply  to affected area two (2) times daily as needed for Itching.   ??? guaiFENesin (ORGANIDIN) 400 mg tablet Take  by mouth every four (4) hours as needed for Congestion.   ??? ferrous sulfate 325 mg (65 mg iron) tablet Take  by mouth Daily (before breakfast). Indications: anemia  from inadequate iron   ??? miconazole nitrate 2 % aerp by Apply Externally route as needed (athlete's foot). Indications: athlete's foot   ??? ginkgo biloba 120 mg tab Take 1 Tab by mouth. Indications: herbal supplement   ??? trolamine salicylate (Arthricream) 10 % topical cream Apply  to affected area as needed for Stiffness.   ??? methyl salicylate/menthol (BEN GAY EX) by Apply Externally route as needed.   ??? acetaminophen (TYLENOL) 500 mg tablet Take 1 Tab by mouth every six (6) hours as needed for Pain. (Patient taking differently: Take 500 mg by mouth every six (6) hours as needed for Pain. Indications: pain)      PAST MEDICAL HISTORY: Past medical history from the initial psychiatric evaluation has been reviewed (reviewed/updated 11/19/2019) with no additional updates (I asked patient and no additional past medical history provided).   Past Medical History:   Diagnosis Date   ??? Anxiety    ??? Chronic constipation    ??? Diabetes (HCC)    ???   Hypertension    ??? Major depression    ??? OCD (obsessive compulsive disorder)    History reviewed. No pertinent surgical history.   SOCIAL HISTORY: Social history from the initial psychiatric evaluation has been reviewed (reviewed/updated 11/19/2019) with no additional updates (I asked patient and no additional social history provided).   Social History     Socioeconomic History   ??? Marital status: SINGLE     Spouse name: Not on file   ??? Number of children: Not on file   ??? Years of education: Not on file   ??? Highest education level: Not on file   Occupational History   ??? Not on file   Social Needs   ??? Financial resource strain: Not on file   ??? Food insecurity     Worry: Not on file     Inability: Not on file   ??? Transportation needs     Medical: Not on file     Non-medical: Not on file   Tobacco Use   ??? Smoking status: Never Smoker   ??? Smokeless tobacco: Never Used   Substance and Sexual Activity   ??? Alcohol use: No     Frequency: Never   ??? Drug use: No   ??? Sexual activity: Not Currently    Lifestyle   ??? Physical activity     Days per week: Not on file     Minutes per session: Not on file   ??? Stress: Not on file   Relationships   ??? Social Product manager on phone: Not on file     Gets together: Not on file     Attends religious service: Not on file     Active member of club or organization: Not on file     Attends meetings of clubs or organizations: Not on file     Relationship status: Not on file   ??? Intimate partner violence     Fear of current or ex partner: Not on file     Emotionally abused: Not on file     Physically abused: Not on file     Forced sexual activity: Not on file   Other Topics Concern   ??? Not on file   Social History Narrative   ??? Not on file      FAMILY HISTORY: Family history from the initial psychiatric evaluation has been reviewed (reviewed/updated 11/19/2019) with no additional updates (I asked patient and no additional family history provided). History reviewed. No pertinent family history.    REVIEW OF SYSTEMS: (reviewed/updated 11/19/2019)  Appetite:improved   Sleep: no change   All other Review of Systems: Negative except per HPI         [place] (MSE):    MSE FINDINGS ARE WITHIN NORMAL LIMITS (WNL) UNLESS OTHERWISE STATED BELOW. ( ALL OF THE BELOW CATEGORIES OF THE MSE HAVE BEEN REVIEWED (reviewed 11/19/2019) AND UPDATED AS DEEMED APPROPRIATE )  General Presentation age appropriate, cooperative and guarded   Orientation oriented to time, place and person   Vital Signs  See below (reviewed 11/19/2019); Vital Signs (BP, Pulse, & Temp) are within normal limits if not listed below.   Gait and Station Stable/steady, no ataxia   Musculoskeletal System No extrapyramidal symptoms (EPS); no abnormal muscular movements or Tardive Dyskinesia (TD); muscle strength and tone are within normal limits   Language No aphasia or dysarthria   Speech:  normal volume  and non-pressured   Thought Processes logical; normal rate of thoughts; fair abstract  reasoning/computation   Thought Associations goal directed   Thought Content preoccupations   Suicidal Ideations contracts for safety   Homicidal Ideations none   Mood:  depressed   Affect:  euthymic and mood-incongruent   Memory recent  intact   Memory remote:  intact   Concentration/Attention:  intact   Fund of Knowledge average   Insight:  limited   Reliability poor   Judgment:  fair          VITALS:     Patient Vitals for the past 24 hrs:   Temp Pulse Resp BP SpO2   11/19/19 0746 98.2 ??F (36.8 ??C) 96 18 (!) 135/93 99 %   11/18/19 2021 ??? ??? 18 ??? ???   11/18/19 1722 ??? 92 ??? (!) 150/81 ???     Wt Readings from Last 3 Encounters:   11/15/19 90.7 kg (200 lb)     Temp Readings from Last 3 Encounters:   11/19/19 98.2 ??F (36.8 ??C)   07/01/17 97.5 ??F (36.4 ??C)     BP Readings from Last 3 Encounters:   11/19/19 (!) 135/93   07/01/17 (!) 168/109     Pulse Readings from Last 3 Encounters:   11/19/19 96   07/01/17 78            DATA     LABORATORY DATA:(reviewed/updated 11/19/2019)  No results found for this or any previous visit (from the past 24 hour(s)).  No results found for: VALF2, VALAC, VALP, VALPR, DS6, CRBAM, CRBAMP, CARB2, XCRBAM  No results found for: LITHM   RADIOLOGY REPORTS:(reviewed/updated 11/19/2019)  No results found.       MEDICATIONS     ALL MEDICATIONS:   Current Facility-Administered Medications   Medication Dose Route Frequency   ??? [START ON 11/20/2019] losartan (COZAAR) tablet 100 mg  100 mg Oral DAILY    And   ??? [START ON 11/20/2019] hydroCHLOROthiazide (HYDRODIURIL) tablet 25 mg  25 mg Oral DAILY   ??? OLANZapine (ZyPREXA) tablet 5 mg  5 mg Oral Q6H PRN   ??? haloperidol lactate (HALDOL) injection 5 mg  5 mg IntraMUSCular Q6H PRN   ??? benztropine (COGENTIN) tablet 1 mg  1 mg Oral BID PRN   ??? diphenhydrAMINE (BENADRYL) injection 50 mg  50 mg IntraMUSCular BID PRN   ??? hydrOXYzine HCL (ATARAX) tablet 50 mg  50 mg Oral TID PRN   ??? LORazepam (ATIVAN) injection 1 mg  1 mg IntraMUSCular Q4H PRN   ??? traZODone (DESYREL) tablet  50 mg  50 mg Oral QHS PRN   ??? magnesium hydroxide (MILK OF MAGNESIA) 400 mg/5 mL oral suspension 30 mL  30 mL Oral DAILY PRN   ??? hydrALAZINE (APRESOLINE) tablet 50 mg  50 mg Oral BID   ??? polyethylene glycol (MIRALAX) packet 17 g  17 g Oral DAILY   ??? naproxen (NAPROSYN) tablet 500 mg  500 mg Oral Q8H PRN   ??? buPROPion XL (WELLBUTRIN XL) tablet 150 mg  150 mg Oral QHS   ??? acetaminophen (TYLENOL) tablet 650 mg  650 mg Oral Q4H PRN   ??? atorvastatin (LIPITOR) tablet 40 mg  40 mg Oral QHS   ??? pregabalin (LYRICA) capsule 100 mg  100 mg Oral DAILY PRN      SCHEDULED MEDICATIONS:   Current Facility-Administered Medications   Medication Dose Route Frequency   ??? [START ON 11/20/2019] losartan (COZAAR) tablet 100 mg  100  mg Oral DAILY    And   ??? [START ON 11/20/2019] hydroCHLOROthiazide (HYDRODIURIL) tablet 25 mg  25 mg Oral DAILY   ??? hydrALAZINE (APRESOLINE) tablet 50 mg  50 mg Oral BID   ??? polyethylene glycol (MIRALAX) packet 17 g  17 g Oral DAILY   ??? buPROPion XL (WELLBUTRIN XL) tablet 150 mg  150 mg Oral QHS   ??? atorvastatin (LIPITOR) tablet 40 mg  40 mg Oral QHS          ASSESSMENT & PLAN     DIAGNOSES REQUIRING ACTIVE TREATMENT AND MONITORING: (reviewed/updated 11/19/2019)  Patient Active Hospital Problem List:   MDD (major depressive disorder), recurrent episode, moderate (HCC) (11/15/2019)    Assessment: patient with ongoing family stressors leading to hospitalization. No substances on board and patient denies prior episodes. He states that he went off antidepressant and is now feeling suicidal. Unclear housing situation and secondary gain may be a factor. Will work with patient to secure safe disposition plan.    Plan:  - CONTINUE Wellbutrin XL 150 mg QDAY for MDD  - IGM therapy as tolerated  - Expand database / obtain collateral  - Dispo planning    In summary, Julian [name], is a 59 y.o.  male who presents with a severe exacerbation of the principal diagnosis of MDD (major depressive disorder), recurrent episode, moderate  (HCC)    Patient's condition is improving.    Patient requires continued inpatient hospitalization for further stabilization, safety monitoring and medication management.  I will continue to coordinate the provision of individual, milieu, occupational, group, and substance abuse therapies to address target symptoms/diagnoses as deemed appropriate for the individual patient.  A coordinated, multidisplinary treatment team round was conducted with the patient (this team consists of the nurse, psychiatric unit pharmacist, Administrator).     Complete current electronic health record for patient has been reviewed today including consultant notes, ancillary staff notes, nurses and psychiatric tech notes.    Suicide risk assessment completed and patient deemed to be of low risk for suicide at this time.     The following regarding medications was addressed during rounds with patient:   the risks and benefits of the proposed medication. The patient was given the opportunity to ask questions. Informed consent given to the use of the above medications. Will continue to adjust psychiatric and non-psychiatric medications (see above "medication" section and orders section for details) as deemed appropriate & based upon diagnoses and response to treatment.     I will continue to order blood tests/labs and diagnostic tests as deemed appropriate and review results as they become available (see orders for details and above listed lab/test results).    I will order psychiatric records from previous psych hospitals to further elucidate the nature of patient's psychopathology and review once available.    I will gather additional collateral information from friends, family and o/p treatment team to further elucidate the nature of patient's psychopathology and baselline level of psychiatric functioning.         I certify that this patient's inpatient psychiatric hospital services furnished since the previous certification  were, and continue to be, required for treatment that could reasonably be expected to improve the patient's condition, or for diagnostic study, and that the patient continues to need, on a daily basis, active treatment furnished directly by or requiring the supervision of inpatient psychiatric facility personnel. In addition, the hospital records show that services furnished were intensive treatment services, admission  or related services, or equivalent services.    EXPECTED DISCHARGE DATE/DAY: 11/19/19     DISPOSITION: Home vs CSU       Signed By:   Hiram Gash, MD  11/19/2019

## 2019-11-19 NOTE — Behavioral Health Treatment Team (Signed)
Behavioral Health Treatment Team Note   ??  Patient goal(s) for today:??Take medications as described.??  Treatment team focus/goals:??Medication adjustments.??  Progress note:??Pt was seen in treatment team this morning. Pt is alert and oriented. Pt denies SI/HI. Pt's mood is??depressed, affect is??WNL. Pt's thought process is??coherent. Pt's insight and judgment is??limited, reliability is??poor. Pt agreeable to stepdown to CSU upon discharge. ??Social work department will continue to coordinate discharge plans.   ??  LOS:????4????????????????????????????????????????????Expected LOS: 4/9  ??  Financial concerns/prescription coverage:??[employer] Medicaid Part A&B with [employer] Extended Coverage and [employer] [facility] [place] [facility] Medicaid ?????Voluntary  Date of last family contact:??None????????????????????????????????????????  Family requesting physician contact today:??No  Discharge plan:??TBD  Guns in the home:??None involved.??????????????????????????????????????????????????????????  Outpatient provider(s): unknown at this time  ??  Participating treatment team members:??Kawika D Wessells,??Ali Flinn, MSW and Dr. Mbadugha, MD

## 2019-11-19 NOTE — Progress Notes (Signed)
Patient received sitting in the day room watching TV.  When attempting to assess patient this evening he responded <<I'm not going to talk about that now>>.  Calm, watching TV in the day room throughout the evening, no interactions with his peers.  Taking medications as prescribed.  Will continue to monitor for safety.    Patient appeared to sleep 7 hours.

## 2019-11-19 NOTE — Behavioral Health Treatment Team (Signed)
GROUP THERAPY PROGRESS NOTE    Patient is participating in Discharge Planning Group.     Group time: 30 minutes    Personal goal for participation: Process feelings related to discharge and/or feelings/goals for today.    Goal orientation: Personal    Group therapy participation: passive    Therapeutic interventions reviewed and discussed: Group discussion was focused on discharge plans and anxiety related to this. Group members discussed what they planned to do once discharge and discharge plans. Patients discussed their goals for today as well as the weekend and what they are working on with treatment team to get ready for discharge.    Impression of participation: Horatio was present in group but did not share. He was observed sitting at a table separate from group and was coloring. He stepped out for treatment team and did not return.    Christine Desiderati BA, MA Internship Student   Theadore Nan LPC [facility]

## 2019-11-19 NOTE — Behavioral Health Treatment Team (Signed)
GROUP THERAPY PROGRESS NOTE    Patient is participating in a Coping Skills Group     Group time: 1 hour    Personal goal for participation: To discuss the impact of negative self-talk on mental health and practice positive affirmations to improve mood and view of self    Goal orientation: Personal    Group therapy participation: passive    Therapeutic interventions reviewed and discussed: Patient discussed the impact of negative self-talk on mental health. Patients discussed and wrote down self-defeating statements they make to themselves. Social worker provided psychoeducation on reframing negative-self talk into positive affirmations. Patients completed exercise where they reframed their self-defeating statements into positive affirmations. Patients listened to soothing music and used Quarry manager to [employer] on and paint a positive affirmation they desire to speak over themselves.     Impression of participation: Pt present in group room throughout group session, sitting at table in the corner coloring. Pt encouraged to come paint and join group, pt declined. Pt calm and quiet.    Orma Flaming, MSW

## 2019-11-19 NOTE — Behavioral Health Treatment Team (Signed)
 Behavioral Health Treatment Team Note     Patient goal(s) for today:Take medications as described.  Treatment team focus/goals:Medication adjustments.  Progress note:Pt was seen in treatment team this morning. Pt is alert and oriented. Pt denies SI/HI. Pt's mood isdepressed, affect isWNL. Pt's thought process iscoherent. Pt's insight and judgment islimited, reliability ispoor. Pt agreeable to stepdown to CSU upon discharge. Social work department will continue to coordinate discharge plans.     LOS:4Expected LOS: 4/9    Financial concerns/prescription coverage:Blue Cross Medicaid Part A&B with QMB Extended Coverage and [employer] [facility] CCC[place] Medicaid -Voluntary  Date of last family contact:None  Family requesting physician contact today:No  Discharge plan:TBD  Guns in the home:None involved.  Outpatient provider(s): unknown at this time    Participating treatment team members:Omri D Dallie Hildegard Nyhan, MSW and Dr. Debbie, MD

## 2019-11-19 NOTE — Behavioral Health Treatment Team (Signed)
GROUP THERAPY PROGRESS NOTE     Patient is participating in Substance abuse/Coping Skills group.    Group time: 50 minutes     Personal goal for participation: To understand values and how behaviors impact values     Goal orientation: Personal     Group therapy participation: passive     Therapeutic interventions reviewed and discussed: Group discussion of values and how behaviors and actions have been in agreement or disagreement with values. Patients able to share their thoughts on values and value related questions designed for patient's to explore their values and beliefs.     Impression of participation: Julian [name] was present in the day room, but did not participate in group discussion.    Christine Desiderati BA, MA Internship Student  Theadore Nan LPC [facility]

## 2019-11-19 NOTE — Behavioral Health Treatment Team (Signed)
PSYCHIATRIC PROGRESS NOTE         Patient Name  Julian [name]   Date of Birth 12/26/1960   CSN [id number]   Medical Record Number  [id number]      Age  59 y.o.   PCP Other, Phys, MD   Admit date:  11/15/2019    Room Number  325/01  @ [facility]   Date of Service  11/19/2019         E & M PROGRESS NOTE:         HISTORY       CC:  "suicidal ideation"  HISTORY OF PRESENT ILLNESS/INTERVAL HISTORY:  (reviewed/updated 11/19/2019).  per initial evaluation: The patient, Julian [name], is a 59 y.o.  BLACK/[place] male with a past psychiatric history significant for MDD, who presents at this time with complaints of (and/or evidence of) the following emotional symptoms: depression and suicidal thoughts/threats.  Additional symptomatology include family stressors.  The above symptoms have been present for 2+ days. These symptoms are of moderate to high severity. These symptoms are intermittent/ fleeting in nature.  The patient's condition has been precipitated by psychosocial stressors.  Patient's condition made worse by treatment noncompliance. UDS: negative; BAL=0.  ??  Per admission documentation, patient reported a desire to run into traffic leading to the current presentation he states that a family member urged him to come in. He reports conditional suicidality and denies prior hospitalizations.  ??  The patient is an unreliable historian. The patient corroborates the above narrative. The patient contracts for safety on the unit and gives consent for the team to contact collateral. The patient is amenable to initiating treatment while on the unit. The patient states that he stopped taking his Wellbutrin due to frustration with his family, he is evasive about the circumstances preceding this situation. The patient reports he lives alone but per SW assessment has been staying on couches.    4/04 - the patient slept 7.5 hours, remains irritable but calm and cooperative. Patient had a disagreement with his  roommate, and was moved to a different room where he is doing better. Patient denies active thoughts of self harm, but endorses passive SI and anxiety; depression persists. Patient medication compliant, able to make needs known.??    4/05 - no acute overnight events. Patient slept 6.5 hours, has been irritable with staff, endorsing ongoing conditional suicidality - he states that if discharged he will attempt to hurt himself. Patient reports anxiety and depression but denies active thoughts of self harm. Patient medication compliant, no PRNs given for agitation x24 hours. Patient smiling, cooperative with peers and in no apparent distress. He denies making conditionally suicidal statements.    4/06 - patient slept 7 hours overnight, he continues to endorse passive SI but is calm and pleasant, interacting appropriately with staff, attending groups. Patient irritable with nursing staff, reports ongoing anxiety but contracts for safety. SW working to ascertain his disposition options, as there is disagreement in the charts are to what his current housing situation is. Patient denies SI/HI/AVH/PI.    4/07 - no acute overnight events. Patient visible, pleasant, cooperative with treatment. He continues to endorse SI, passive and intermittent, but acknowledges on interview that he has been transient and is hoping to have more help with housing. Patient given 48 hours notice, plan to discharge to shelter Friday. Patient given housing resources, voices understanding.        SIDE EFFECTS: (reviewed/updated 11/19/2019)  None reported or admitted  to.     ALLERGIES:(reviewed/updated 11/19/2019)  Allergies   Allergen Reactions   ??? Ace Inhibitors Cough      MEDICATIONS PRIOR TO ADMISSION:(reviewed/updated 11/19/2019)  Medications Prior to Admission   Medication Sig   ??? buPROPion XL (WELLBUTRIN XL) 150 mg tablet Take 150 mg by mouth daily. Indications: major depressive disorder, Patient reports last dose x 4 days ago.   ??? hydrOXYzine HCL  (ATARAX) 50 mg tablet Take 50 mg by mouth two (2) times a day. Indications: anxious, Patient reports last dose x 4 days ago   ??? naproxen (NAPROSYN) 500 mg tablet Take 500 mg by mouth two (2) times daily as needed for Pain. Indications: pain   ??? hydrALAZINE (APRESOLINE) 50 mg tablet Take 50 mg by mouth two (2) times a day. Indications: high blood pressure, Patient reports last dose x 2 weeks ago.   ??? losartan-hydroCHLOROthiazide (HYZAAR) 100-25 mg per tablet Take 1 Tab by mouth daily. Indications: high blood pressure, Patient reports last dose x 2 weeks ago   ??? pregabalin (LYRICA) 50 mg capsule Take 50 mg by mouth two (2) times a day. Indications: neuropathic pain   ??? polyethylene glycol (Miralax) 17 gram/dose powder Take 17 g by mouth daily. Indications: constipation   ??? atorvastatin (LIPITOR) 40 mg tablet Take 40 mg by mouth nightly. Indications: excessive fat in the blood   ??? hydrocortisone (CORTAID) 1 % topical cream Apply  to affected area two (2) times daily as needed for Skin Irritation. use thin layer   Indications: inflammation of the skin due to an allergy   ??? lidocaine (XYLOCAINE) 4 % topical cream Apply  to affected area two (2) times daily as needed for Pain. Indications: skin irritation   ??? green tea leaf extract (GREEN TEA PO) Take 500 mg by mouth. Indications: herbal supplement   ??? B-complex with vitamin C (VITAMIN B COMPLEX-C PO) Take 1 Tab by mouth. Indications: vitamin B deficiency   ??? Panax ginseng root (GINSENG KOREAN PO) Take 100 mg by mouth.   ??? eucalypt/men/camph/turp/wh.pet (MEDICATED CHEST RUB EX) by Apply Externally route as needed for Cough.   ??? diphenhydrAMINE-zinc acetate 2%-0.1% (BENADRYL) 2-0.1 % topical cream Apply  to affected area two (2) times daily as needed for Itching.   ??? guaiFENesin (ORGANIDIN) 400 mg tablet Take  by mouth every four (4) hours as needed for Congestion.   ??? ferrous sulfate 325 mg (65 mg iron) tablet Take  by mouth Daily (before breakfast). Indications: anemia  from inadequate iron   ??? miconazole nitrate 2 % aerp by Apply Externally route as needed (athlete's foot). Indications: athlete's foot   ??? ginkgo biloba 120 mg tab Take 1 Tab by mouth. Indications: herbal supplement   ??? trolamine salicylate (Arthricream) 10 % topical cream Apply  to affected area as needed for Stiffness.   ??? methyl salicylate/menthol (BEN GAY EX) by Apply Externally route as needed.   ??? acetaminophen (TYLENOL) 500 mg tablet Take 1 Tab by mouth every six (6) hours as needed for Pain. (Patient taking differently: Take 500 mg by mouth every six (6) hours as needed for Pain. Indications: pain)      PAST MEDICAL HISTORY: Past medical history from the initial psychiatric evaluation has been reviewed (reviewed/updated 11/19/2019) with no additional updates (I asked patient and no additional past medical history provided).   Past Medical History:   Diagnosis Date   ??? Anxiety    ??? Chronic constipation    ??? Diabetes (HCC)    ???  Hypertension    ??? Major depression    ??? OCD (obsessive compulsive disorder)    History reviewed. No pertinent surgical history.   SOCIAL HISTORY: Social history from the initial psychiatric evaluation has been reviewed (reviewed/updated 11/19/2019) with no additional updates (I asked patient and no additional social history provided).   Social History     Socioeconomic History   ??? Marital status: SINGLE     Spouse name: Not on file   ??? Number of children: Not on file   ??? Years of education: Not on file   ??? Highest education level: Not on file   Occupational History   ??? Not on file   Social Needs   ??? Financial resource strain: Not on file   ??? Food insecurity     Worry: Not on file     Inability: Not on file   ??? Transportation needs     Medical: Not on file     Non-medical: Not on file   Tobacco Use   ??? Smoking status: Never Smoker   ??? Smokeless tobacco: Never Used   Substance and Sexual Activity   ??? Alcohol use: No     Frequency: Never   ??? Drug use: No   ??? Sexual activity: Not Currently    Lifestyle   ??? Physical activity     Days per week: Not on file     Minutes per session: Not on file   ??? Stress: Not on file   Relationships   ??? Social Product manager on phone: Not on file     Gets together: Not on file     Attends religious service: Not on file     Active member of club or organization: Not on file     Attends meetings of clubs or organizations: Not on file     Relationship status: Not on file   ??? Intimate partner violence     Fear of current or ex partner: Not on file     Emotionally abused: Not on file     Physically abused: Not on file     Forced sexual activity: Not on file   Other Topics Concern   ??? Not on file   Social History Narrative   ??? Not on file      FAMILY HISTORY: Family history from the initial psychiatric evaluation has been reviewed (reviewed/updated 11/19/2019) with no additional updates (I asked patient and no additional family history provided). History reviewed. No pertinent family history.    REVIEW OF SYSTEMS: (reviewed/updated 11/19/2019)  Appetite:improved   Sleep: no change   All other Review of Systems: Negative except per HPI         [place] (MSE):    MSE FINDINGS ARE WITHIN NORMAL LIMITS (WNL) UNLESS OTHERWISE STATED BELOW. ( ALL OF THE BELOW CATEGORIES OF THE MSE HAVE BEEN REVIEWED (reviewed 11/19/2019) AND UPDATED AS DEEMED APPROPRIATE )  General Presentation age appropriate, cooperative and guarded   Orientation oriented to time, place and person   Vital Signs  See below (reviewed 11/19/2019); Vital Signs (BP, Pulse, & Temp) are within normal limits if not listed below.   Gait and Station Stable/steady, no ataxia   Musculoskeletal System No extrapyramidal symptoms (EPS); no abnormal muscular movements or Tardive Dyskinesia (TD); muscle strength and tone are within normal limits   Language No aphasia or dysarthria   Speech:  normal volume  and non-pressured   Thought Processes logical; normal rate of thoughts; fair abstract  reasoning/computation   Thought Associations goal directed   Thought Content preoccupations   Suicidal Ideations contracts for safety   Homicidal Ideations none   Mood:  depressed   Affect:  euthymic and mood-incongruent   Memory recent  intact   Memory remote:  intact   Concentration/Attention:  intact   Fund of Knowledge average   Insight:  limited   Reliability poor   Judgment:  fair          VITALS:     Patient Vitals for the past 24 hrs:   Temp Pulse Resp BP SpO2   11/19/19 0746 98.2 ??F (36.8 ??C) 96 18 (!) 135/93 99 %   11/18/19 2021 ??? ??? 18 ??? ???   11/18/19 1722 ??? 92 ??? (!) 150/81 ???     Wt Readings from Last 3 Encounters:   11/15/19 90.7 kg (200 lb)     Temp Readings from Last 3 Encounters:   11/19/19 98.2 ??F (36.8 ??C)   07/01/17 97.5 ??F (36.4 ??C)     BP Readings from Last 3 Encounters:   11/19/19 (!) 135/93   07/01/17 (!) 168/109     Pulse Readings from Last 3 Encounters:   11/19/19 96   07/01/17 78            DATA     LABORATORY DATA:(reviewed/updated 11/19/2019)  No results found for this or any previous visit (from the past 24 hour(s)).  No results found for: VALF2, VALAC, VALP, VALPR, DS6, CRBAM, CRBAMP, CARB2, XCRBAM  No results found for: LITHM   RADIOLOGY REPORTS:(reviewed/updated 11/19/2019)  No results found.       MEDICATIONS     ALL MEDICATIONS:   Current Facility-Administered Medications   Medication Dose Route Frequency   ??? [START ON 11/20/2019] losartan (COZAAR) tablet 100 mg  100 mg Oral DAILY    And   ??? [START ON 11/20/2019] hydroCHLOROthiazide (HYDRODIURIL) tablet 25 mg  25 mg Oral DAILY   ??? OLANZapine (ZyPREXA) tablet 5 mg  5 mg Oral Q6H PRN   ??? haloperidol lactate (HALDOL) injection 5 mg  5 mg IntraMUSCular Q6H PRN   ??? benztropine (COGENTIN) tablet 1 mg  1 mg Oral BID PRN   ??? diphenhydrAMINE (BENADRYL) injection 50 mg  50 mg IntraMUSCular BID PRN   ??? hydrOXYzine HCL (ATARAX) tablet 50 mg  50 mg Oral TID PRN   ??? LORazepam (ATIVAN) injection 1 mg  1 mg IntraMUSCular Q4H PRN   ??? traZODone (DESYREL) tablet  50 mg  50 mg Oral QHS PRN   ??? magnesium hydroxide (MILK OF MAGNESIA) 400 mg/5 mL oral suspension 30 mL  30 mL Oral DAILY PRN   ??? hydrALAZINE (APRESOLINE) tablet 50 mg  50 mg Oral BID   ??? polyethylene glycol (MIRALAX) packet 17 g  17 g Oral DAILY   ??? naproxen (NAPROSYN) tablet 500 mg  500 mg Oral Q8H PRN   ??? buPROPion XL (WELLBUTRIN XL) tablet 150 mg  150 mg Oral QHS   ??? acetaminophen (TYLENOL) tablet 650 mg  650 mg Oral Q4H PRN   ??? atorvastatin (LIPITOR) tablet 40 mg  40 mg Oral QHS   ??? pregabalin (LYRICA) capsule 100 mg  100 mg Oral DAILY PRN      SCHEDULED MEDICATIONS:   Current Facility-Administered Medications   Medication Dose Route Frequency   ??? [START ON 11/20/2019] losartan (COZAAR) tablet 100 mg  100  mg Oral DAILY    And   ??? [START ON 11/20/2019] hydroCHLOROthiazide (HYDRODIURIL) tablet 25 mg  25 mg Oral DAILY   ??? hydrALAZINE (APRESOLINE) tablet 50 mg  50 mg Oral BID   ??? polyethylene glycol (MIRALAX) packet 17 g  17 g Oral DAILY   ??? buPROPion XL (WELLBUTRIN XL) tablet 150 mg  150 mg Oral QHS   ??? atorvastatin (LIPITOR) tablet 40 mg  40 mg Oral QHS          ASSESSMENT & PLAN     DIAGNOSES REQUIRING ACTIVE TREATMENT AND MONITORING: (reviewed/updated 11/19/2019)  Patient Active Hospital Problem List:   MDD (major depressive disorder), recurrent episode, moderate (HCC) (11/15/2019)    Assessment: patient with ongoing family stressors leading to hospitalization. No substances on board and patient denies prior episodes. He states that he went off antidepressant and is now feeling suicidal. Unclear housing situation and secondary gain may be a factor. Will work with patient to secure safe disposition plan.    Plan:  - CONTINUE Wellbutrin XL 150 mg QDAY for MDD  - IGM therapy as tolerated  - Expand database / obtain collateral  - Dispo planning    In summary, Julian [name], is a 59 y.o.  male who presents with a severe exacerbation of the principal diagnosis of MDD (major depressive disorder), recurrent episode, moderate  (HCC)    Patient's condition is improving.    Patient requires continued inpatient hospitalization for further stabilization, safety monitoring and medication management.  I will continue to coordinate the provision of individual, milieu, occupational, group, and substance abuse therapies to address target symptoms/diagnoses as deemed appropriate for the individual patient.  A coordinated, multidisplinary treatment team round was conducted with the patient (this team consists of the nurse, psychiatric unit pharmacist, Administrator).     Complete current electronic health record for patient has been reviewed today including consultant notes, ancillary staff notes, nurses and psychiatric tech notes.    Suicide risk assessment completed and patient deemed to be of low risk for suicide at this time.     The following regarding medications was addressed during rounds with patient:   the risks and benefits of the proposed medication. The patient was given the opportunity to ask questions. Informed consent given to the use of the above medications. Will continue to adjust psychiatric and non-psychiatric medications (see above "medication" section and orders section for details) as deemed appropriate & based upon diagnoses and response to treatment.     I will continue to order blood tests/labs and diagnostic tests as deemed appropriate and review results as they become available (see orders for details and above listed lab/test results).    I will order psychiatric records from previous psych hospitals to further elucidate the nature of patient's psychopathology and review once available.    I will gather additional collateral information from friends, family and o/p treatment team to further elucidate the nature of patient's psychopathology and baselline level of psychiatric functioning.         I certify that this patient's inpatient psychiatric hospital services furnished since the previous certification  were, and continue to be, required for treatment that could reasonably be expected to improve the patient's condition, or for diagnostic study, and that the patient continues to need, on a daily basis, active treatment furnished directly by or requiring the supervision of inpatient psychiatric facility personnel. In addition, the hospital records show that services furnished were intensive treatment services, admission  or related services, or equivalent services.    EXPECTED DISCHARGE DATE/DAY: 11/19/19     DISPOSITION: Home vs CSU       Signed By:   Hiram Gash, MD  11/19/2019

## 2019-11-19 NOTE — Progress Notes (Signed)
 Problem: Suicide  Goal: *STG: Remains safe in hospital  Outcome: Progressing Towards Goal     Problem: Depressed Mood (Adult/Pediatric)  Goal: *STG: Participates in treatment plan  Outcome: Progressing Towards Goal  Goal: *STG: Complies with medication therapy  Outcome: Progressing Towards Goal     0700: Shift change report given to Abigail K. (Cabin crew) by Rock GLADE (offgoing nurse). Report included the following information SBAR, Kardex, MAR and Recent Results.     0800-1000: Assumed care of patient. Patient endorsed anxiety 3/10 and SI. Patient stated the thoughts comes and goes, in intervals. Patient then went on to state that he is getting better and will be safe here in the hospital. Patient med and meal compliant. Patient to dayroom for groups and meals.     1000-1200: Patient awake in bed, resting quietly.     1200-1400: Patient to dayroom for meals. Patient sleeping in bed. No signs of distress noted.    1400-1600: Patient sitting quietly in dayroom coloring. No voiced concerns.    1600-1800: Patient to dayroom for meals. Another patient playfully touched the patient and the patient began yelling, which then caused the other patient to shout back. Writer and tech in hallway diffused the situation. Writer needed BP taken before scheduled Hydralazine. Pt kept moving while cuff was on. BP 153/141. Patient refused Clinical research associate to retake. Began yelling at Clinical research associate. Given medication.    1800-1900: Patient in dayroom sitting quietly. No voiced concerns.

## 2019-11-19 NOTE — Progress Notes (Signed)
Problem: Suicide  Goal: *STG: Remains safe in hospital  Outcome: Progressing Towards Goal     Problem: Depressed Mood (Adult/Pediatric)  Goal: *STG: Complies with medication therapy  Outcome: Progressing Towards Goal     Problem: Falls - Risk of  Goal: *Absence of Falls  Description: Document Schmid Fall Risk and appropriate interventions in the flowsheet.  Outcome: Progressing Towards Goal  Note: Fall Risk Interventions:    Medication Interventions: Teach patient to arise slowly

## 2019-11-19 NOTE — Progress Notes (Signed)
Patient received sitting in the day room watching TV with his peers.  Endorses passive SI with no plan but states that it is <<getting better>>, depression and anxiety.  Denies HI/AVH.  Calm, cooperative, smiling, interacting with his peers, attending groups.  Taking medications as prescribed.  Will continue to monitor for safety.    Patient appeared to sleep 7.5 hours.

## 2019-11-20 MED FILL — HYDROCHLOROTHIAZIDE 25 MG TAB: 25 mg | ORAL | Qty: 1

## 2019-11-20 MED FILL — HYDROXYZINE 25 MG TAB: 25 mg | ORAL | Qty: 2

## 2019-11-20 MED FILL — HEALTHYLAX 17 GRAM ORAL POWDER PACKET: 17 gram | ORAL | Qty: 1

## 2019-11-20 MED FILL — HYDRALAZINE 50 MG TAB: 50 mg | ORAL | Qty: 1

## 2019-11-20 MED FILL — BUPROPION XL 150 MG 24 HR TAB: 150 mg | ORAL | Qty: 1

## 2019-11-20 MED FILL — LOSARTAN 25 MG TAB: 25 mg | ORAL | Qty: 4

## 2019-11-20 MED FILL — ATORVASTATIN 40 MG TAB: 40 mg | ORAL | Qty: 1

## 2019-11-20 NOTE — Behavioral Health Treatment Team (Signed)
GROUP THERAPY PROGRESS NOTE    Patient did not participate in Coping Skills group.     Melissa Schall LPC LSATP CSAC

## 2019-11-20 NOTE — Behavioral Health Treatment Team (Signed)
Behavioral Health Treatment Team Note   ??  Patient goal(s) for today:??Take medications as described.??  Treatment team focus/goals:??Medication adjustments.??  Progress note:??Pt was seen in treatment team this morning. Pt is alert and oriented. Pt denies SI/HI. Pt's mood is??depressed, affect is??WNL. Pt's thought process is??coherent. Pt's insight and judgment is??limited, reliability is??poor. Pt agreeable to stepdown to CSU upon discharge. ??Social work department will continue to coordinate discharge plans.   ??  LOS:????5????????????????????????????????????Expected LOS: 4/9 to CSU at 10:00AM   ??  Financial concerns/prescription coverage:??Blue Cross Medicaid Part A&B with QMB Extended Coverage and [employer] [facility] [place] CCC[place] Medicaid ?????Voluntary  Date of last family contact:??None????????????????????????????????????????  Family requesting physician contact today:??No  Discharge plan:??TBD  Guns in the home:??None involved.??????????????????????????????????????????????????????????  Outpatient provider(s):??unknown??at this time  ??  Participating treatment team members:??Laymond D Calixte,??Ali Flinn, MSW and Dr. Mbadugha, MD

## 2019-11-20 NOTE — Group Note (Cosign Needed)
IP BH GROUP DOCUMENTATION INDIVIDUAL                                                                          Group Therapy Note    Date: 11/20/2019    Group Start Time: 0900  Group End Time: 1000  Group Topic: Topic Group    [facility]    Baker, Beverly S    IP BH GROUP DOCUMENTATION GROUP    Group Therapy Note    Attendees: 4         Attendance: Did not attend    Patient's Goal:      Interventions/techniquesBeverly S Baker

## 2019-11-20 NOTE — Group Note (Cosign Needed)
IP BH GROUP DOCUMENTATION INDIVIDUAL                                                                          Group Therapy Note    Date: 11/20/2019    Group Start Time: 1500  Group End Time: 1600  Group Topic: Recreational/Music Therapy    [facility]    Baker, Beverly S    IP BH GROUP DOCUMENTATION GROUP    Group Therapy Note    Attendees: 5         Attendance: Attended    Patient's Goal:  To concentrate on selected task    Interventions/techniques: Supported-crafts,games,music    Follows Directions: Followed directions    Interactions: Interacted appropriately    Mental Status: Calm    Behavior/appearance: Attentive, Cooperative and Needed prompting    Goals Achieved: Able to engage in interactions and Able to listen to others      Additional Notes:      Audelia Hives

## 2019-11-20 NOTE — Behavioral Health Treatment Team (Signed)
PSYCHIATRIC PROGRESS NOTE         Patient Name  Julian [name]   Date of Birth 09/30/1960   CSN [id number]   Medical Record Number  [id number]      Age  59 y.o.   PCP Other, Phys, MD   Admit date:  11/15/2019    Room Number  325/01  @ [facility]   Date of Service  11/20/2019         E & M PROGRESS NOTE:         HISTORY       CC:  "suicidal ideation"  HISTORY OF PRESENT ILLNESS/INTERVAL HISTORY:  (reviewed/updated 11/20/2019).  per initial evaluation: The patient, Julian [name], is a 59 y.o.  BLACK/A[place] AMER[place] male with a past psychiatric history significant for MDD, who presents at this time with complaints of (and/or evidence of) the following emotional symptoms: depression and suicidal thoughts/threats.  Additional symptomatology include family stressors.  The above symptoms have been present for 2+ days. These symptoms are of moderate to high severity. These symptoms are intermittent/ fleeting in nature.  The patient's condition has been precipitated by psychosocial stressors.  Patient's condition made worse by treatment noncompliance. UDS: negative; BAL=0.  ??  Per admission documentation, patient reported a desire to run into traffic leading to the current presentation he states that a family member urged him to come in. He reports conditional suicidality and denies prior hospitalizations.  ??  The patient is an unreliable historian. The patient corroborates the above narrative. The patient contracts for safety on the unit and gives consent for the team to contact collateral. The patient is amenable to initiating treatment while on the unit. The patient states that he stopped taking his Wellbutrin due to frustration with his family, he is evasive about the circumstances preceding this situation. The patient reports he lives alone but per SW assessment has been staying on couches.    4/04 - the patient slept 7.5 hours, remains irritable but calm and cooperative. Patient had a disagreement with his  roommate, and was moved to a different room where he is doing better. Patient denies active thoughts of self harm, but endorses passive SI and anxiety; depression persists. Patient medication compliant, able to make needs known.??    4/05 - no acute overnight events. Patient slept 6.5 hours, has been irritable with staff, endorsing ongoing conditional suicidality - he states that if discharged he will attempt to hurt himself. Patient reports anxiety and depression but denies active thoughts of self harm. Patient medication compliant, no PRNs given for agitation x24 hours. Patient smiling, cooperative with peers and in no apparent distress. He denies making conditionally suicidal statements.    4/06 - patient slept 7 hours overnight, he continues to endorse passive SI but is calm and pleasant, interacting appropriately with staff, attending groups. Patient irritable with nursing staff, reports ongoing anxiety but contracts for safety. SW working to ascertain his disposition options, as there is disagreement in the charts are to what his current housing situation is. Patient denies SI/HI/AVH/PI.    4/07 - no acute overnight events. Patient visible, pleasant, cooperative with treatment. He continues to endorse SI, passive and intermittent, but acknowledges on interview that he has been transient and is hoping to have more help with housing. Patient given 48 hours notice, plan to discharge to shelter Friday. Patient given housing resources, voices understanding.    4/08 - patient slept 7 hours overnight, he remains irritable but cooperative with   treatment. Patient still endorsing some passive SI but denies active thoughts of self harm. Patient for CSU placement tomorrow AM. SW coordinating disposition. He denies HI/AVH/PI.        SIDE EFFECTS: (reviewed/updated 11/20/2019)  None reported or admitted to.     ALLERGIES:(reviewed/updated 11/20/2019)  Allergies   Allergen Reactions   ??? Ace Inhibitors Cough      MEDICATIONS PRIOR  TO ADMISSION:(reviewed/updated 11/20/2019)  Medications Prior to Admission   Medication Sig   ??? buPROPion XL (WELLBUTRIN XL) 150 mg tablet Take 150 mg by mouth daily. Indications: major depressive disorder, Patient reports last dose x 4 days ago.   ??? hydrOXYzine HCL (ATARAX) 50 mg tablet Take 50 mg by mouth two (2) times a day. Indications: anxious, Patient reports last dose x 4 days ago   ??? naproxen (NAPROSYN) 500 mg tablet Take 500 mg by mouth two (2) times daily as needed for Pain. Indications: pain   ??? hydrALAZINE (APRESOLINE) 50 mg tablet Take 50 mg by mouth two (2) times a day. Indications: high blood pressure, Patient reports last dose x 2 weeks ago.   ??? losartan-hydroCHLOROthiazide (HYZAAR) 100-25 mg per tablet Take 1 Tab by mouth daily. Indications: high blood pressure, Patient reports last dose x 2 weeks ago   ??? pregabalin (LYRICA) 50 mg capsule Take 50 mg by mouth two (2) times a day. Indications: neuropathic pain   ??? polyethylene glycol (Miralax) 17 gram/dose powder Take 17 g by mouth daily. Indications: constipation   ??? atorvastatin (LIPITOR) 40 mg tablet Take 40 mg by mouth nightly. Indications: excessive fat in the blood   ??? hydrocortisone (CORTAID) 1 % topical cream Apply  to affected area two (2) times daily as needed for Skin Irritation. use thin layer   Indications: inflammation of the skin due to an allergy   ??? lidocaine (XYLOCAINE) 4 % topical cream Apply  to affected area two (2) times daily as needed for Pain. Indications: skin irritation   ??? green tea leaf extract (GREEN TEA PO) Take 500 mg by mouth. Indications: herbal supplement   ??? B-complex with vitamin C (VITAMIN B COMPLEX-C PO) Take 1 Tab by mouth. Indications: vitamin B deficiency   ??? Panax ginseng root (GINSENG KOREAN PO) Take 100 mg by mouth.   ??? eucalypt/men/camph/turp/wh.pet (MEDICATED CHEST RUB EX) by Apply Externally route as needed for Cough.   ??? diphenhydrAMINE-zinc acetate 2%-0.1% (BENADRYL) 2-0.1 % topical cream Apply  to  affected area two (2) times daily as needed for Itching.   ??? guaiFENesin (ORGANIDIN) 400 mg tablet Take  by mouth every four (4) hours as needed for Congestion.   ??? ferrous sulfate 325 mg (65 mg iron) tablet Take  by mouth Daily (before breakfast). Indications: anemia from inadequate iron   ??? miconazole nitrate 2 % aerp by Apply Externally route as needed (athlete's foot). Indications: athlete's foot   ??? ginkgo biloba 120 mg tab Take 1 Tab by mouth. Indications: herbal supplement   ??? trolamine salicylate (Arthricream) 10 % topical cream Apply  to affected area as needed for Stiffness.   ??? methyl salicylate/menthol (BEN GAY EX) by Apply Externally route as needed.   ??? acetaminophen (TYLENOL) 500 mg tablet Take 1 Tab by mouth every six (6) hours as needed for Pain. (Patient taking differently: Take 500 mg by mouth every six (6) hours as needed for Pain. Indications: pain)      PAST MEDICAL HISTORY: Past medical history from the initial psychiatric evaluation has been reviewed (  reviewed/updated 11/20/2019) with no additional updates (I asked patient and no additional past medical history provided).   Past Medical History:   Diagnosis Date   ??? Anxiety    ??? Chronic constipation    ??? Diabetes (HCC)    ??? Hypertension    ??? Major depression    ??? OCD (obsessive compulsive disorder)    History reviewed. No pertinent surgical history.   SOCIAL HISTORY: Social history from the initial psychiatric evaluation has been reviewed (reviewed/updated 11/20/2019) with no additional updates (I asked patient and no additional social history provided).   Social History     Socioeconomic History   ??? Marital status: SINGLE     Spouse name: Not on file   ??? Number of children: Not on file   ??? Years of education: Not on file   ??? Highest education level: Not on file   Occupational History   ??? Not on file   Social Needs   ??? Financial resource strain: Not on file   ??? Food insecurity     Worry: Not on file     Inability: Not on file   ??? Transportation  needs     Medical: Not on file     Non-medical: Not on file   Tobacco Use   ??? Smoking status: Never Smoker   ??? Smokeless tobacco: Never Used   Substance and Sexual Activity   ??? Alcohol use: No     Frequency: Never   ??? Drug use: No   ??? Sexual activity: Not Currently   Lifestyle   ??? Physical activity     Days per week: Not on file     Minutes per session: Not on file   ??? Stress: Not on file   Relationships   ??? Social Wellsite geologist on phone: Not on file     Gets together: Not on file     Attends religious service: Not on file     Active member of club or organization: Not on file     Attends meetings of clubs or organizations: Not on file     Relationship status: Not on file   ??? Intimate partner violence     Fear of current or ex partner: Not on file     Emotionally abused: Not on file     Physically abused: Not on file     Forced sexual activity: Not on file   Other Topics Concern   ??? Not on file   Social History Narrative   ??? Not on file      FAMILY HISTORY: Family history from the initial psychiatric evaluation has been reviewed (reviewed/updated 11/20/2019) with no additional updates (I asked patient and no additional family history provided). History reviewed. No pertinent family history.    REVIEW OF SYSTEMS: (reviewed/updated 11/20/2019)  Appetite:improved   Sleep: no change   All other Review of Systems: Negative except per HPI         MENTAL STATUS EXAM & VITALS     MENTAL STATUS EXAM (MSE):    MSE FINDINGS ARE WITHIN NORMAL LIMITS (WNL) UNLESS OTHERWISE STATED BELOW. ( ALL OF THE BELOW CATEGORIES OF THE MSE HAVE BEEN REVIEWED (reviewed 11/20/2019) AND UPDATED AS DEEMED APPROPRIATE )  General Presentation age appropriate, cooperative and guarded   Orientation oriented to time, place and person   Vital Signs  See below (reviewed 11/20/2019); Vital Signs (BP, Pulse, & Temp) are within normal limits if not listed below.  Gait and Station Stable/steady, no ataxia   Musculoskeletal System No extrapyramidal  symptoms (EPS); no abnormal muscular movements or Tardive Dyskinesia (TD); muscle strength and tone are within normal limits   Language No aphasia or dysarthria   Speech:  normal volume and non-pressured   Thought Processes logical; normal rate of thoughts; fair abstract reasoning/computation   Thought Associations goal directed   Thought Content preoccupations   Suicidal Ideations contracts for safety   Homicidal Ideations none   Mood:  depressed   Affect:  euthymic and mood-incongruent   Memory recent  intact   Memory remote:  intact   Concentration/Attention:  intact   Fund of Knowledge average   Insight:  limited   Reliability poor   Judgment:  fair          VITALS:     Patient Vitals for the past 24 hrs:   Temp Pulse Resp BP SpO2   11/20/19 0745 ??? 96 18 (!) 143/92 96 %   11/19/19 1942 98.5 ??F (36.9 ??C) 98 18 (!) 146/90 97 %   11/19/19 1800 ??? (!) 109 ??? (!) 153/141 100 %     Wt Readings from Last 3 Encounters:   11/15/19 90.7 kg (200 lb)     Temp Readings from Last 3 Encounters:   11/19/19 98.5 ??F (36.9 ??C)   07/01/17 97.5 ??F (36.4 ??C)     BP Readings from Last 3 Encounters:   11/20/19 (!) 143/92   07/01/17 (!) 168/109     Pulse Readings from Last 3 Encounters:   11/20/19 96   07/01/17 78            DATA     LABORATORY DATA:(reviewed/updated 11/20/2019)  No results found for this or any previous visit (from the past 24 hour(s)).  No results found for: VALF2, VALAC, VALP, VALPR, DS6, CRBAM, CRBAMP, CARB2, XCRBAM  No results found for: LITHM   RADIOLOGY REPORTS:(reviewed/updated 11/20/2019)  No results found.       MEDICATIONS     ALL MEDICATIONS:   Current Facility-Administered Medications   Medication Dose Route Frequency   ??? losartan (COZAAR) tablet 100 mg  100 mg Oral DAILY    And   ??? hydroCHLOROthiazide (HYDRODIURIL) tablet 25 mg  25 mg Oral DAILY   ??? OLANZapine (ZyPREXA) tablet 5 mg  5 mg Oral Q6H PRN   ??? haloperidol lactate (HALDOL) injection 5 mg  5 mg IntraMUSCular Q6H PRN   ??? benztropine (COGENTIN) tablet 1  mg  1 mg Oral BID PRN   ??? diphenhydrAMINE (BENADRYL) injection 50 mg  50 mg IntraMUSCular BID PRN   ??? hydrOXYzine HCL (ATARAX) tablet 50 mg  50 mg Oral TID PRN   ??? LORazepam (ATIVAN) injection 1 mg  1 mg IntraMUSCular Q4H PRN   ??? traZODone (DESYREL) tablet 50 mg  50 mg Oral QHS PRN   ??? magnesium hydroxide (MILK OF MAGNESIA) 400 mg/5 mL oral suspension 30 mL  30 mL Oral DAILY PRN   ??? hydrALAZINE (APRESOLINE) tablet 50 mg  50 mg Oral BID   ??? polyethylene glycol (MIRALAX) packet 17 g  17 g Oral DAILY   ??? naproxen (NAPROSYN) tablet 500 mg  500 mg Oral Q8H PRN   ??? buPROPion XL (WELLBUTRIN XL) tablet 150 mg  150 mg Oral QHS   ??? acetaminophen (TYLENOL) tablet 650 mg  650 mg Oral Q4H PRN   ??? atorvastatin (LIPITOR) tablet 40 mg  40 mg Oral QHS   ??? pregabalin (  LYRICA) capsule 100 mg  100 mg Oral DAILY PRN      SCHEDULED MEDICATIONS:   Current Facility-Administered Medications   Medication Dose Route Frequency   ??? losartan (COZAAR) tablet 100 mg  100 mg Oral DAILY    And   ??? hydroCHLOROthiazide (HYDRODIURIL) tablet 25 mg  25 mg Oral DAILY   ??? hydrALAZINE (APRESOLINE) tablet 50 mg  50 mg Oral BID   ??? polyethylene glycol (MIRALAX) packet 17 g  17 g Oral DAILY   ??? buPROPion XL (WELLBUTRIN XL) tablet 150 mg  150 mg Oral QHS   ??? atorvastatin (LIPITOR) tablet 40 mg  40 mg Oral QHS          ASSESSMENT & PLAN     DIAGNOSES REQUIRING ACTIVE TREATMENT AND MONITORING: (reviewed/updated 11/20/2019)  Patient Active Hospital Problem List:   MDD (major depressive disorder), recurrent episode, moderate (HCC) (11/15/2019)    Assessment: patient with ongoing family stressors leading to hospitalization. No substances on board and patient denies prior episodes. He states that he went off antidepressant and is now feeling suicidal. Unclear housing situation and secondary gain may be a factor. Will work with patient to secure safe disposition plan.    Plan:  - CONTINUE Wellbutrin XL 150 mg QDAY for MDD  - IGM therapy as tolerated  - Expand database /  obtain collateral  - Dispo planning    In summary, Julian [name], is a 59 y.o.  male who presents with a severe exacerbation of the principal diagnosis of MDD (major depressive disorder), recurrent episode, moderate (HCC)    Patient's condition is improving.    Patient requires continued inpatient hospitalization for further stabilization, safety monitoring and medication management.  I will continue to coordinate the provision of individual, milieu, occupational, group, and substance abuse therapies to address target symptoms/diagnoses as deemed appropriate for the individual patient.  A coordinated, multidisplinary treatment team round was conducted with the patient (this team consists of the nurse, psychiatric unit pharmacist, Administratorsocial worker and writer).     Complete current electronic health record for patient has been reviewed today including consultant notes, ancillary staff notes, nurses and psychiatric tech notes.    Suicide risk assessment completed and patient deemed to be of low risk for suicide at this time.     The following regarding medications was addressed during rounds with patient:   the risks and benefits of the proposed medication. The patient was given the opportunity to ask questions. Informed consent given to the use of the above medications. Will continue to adjust psychiatric and non-psychiatric medications (see above "medication" section and orders section for details) as deemed appropriate & based upon diagnoses and response to treatment.     I will continue to order blood tests/labs and diagnostic tests as deemed appropriate and review results as they become available (see orders for details and above listed lab/test results).    I will order psychiatric records from previous psych hospitals to further elucidate the nature of patient's psychopathology and review once available.    I will gather additional collateral information from friends, family and o/p treatment team to further  elucidate the nature of patient's psychopathology and baselline level of psychiatric functioning.         I certify that this patient's inpatient psychiatric hospital services furnished since the previous certification were, and continue to be, required for treatment that could reasonably be expected to improve the patient's condition, or for diagnostic study, and that the patient  continues to need, on a daily basis, active treatment furnished directly by or requiring the supervision of inpatient psychiatric facility personnel. In addition, the hospital records show that services furnished were intensive treatment services, admission or related services, or equivalent services.    EXPECTED DISCHARGE DATE/DAY: 11/19/19     DISPOSITION: Home vs CSU       Signed By:   Hiram Gash, MD  11/20/2019

## 2019-11-20 NOTE — Progress Notes (Signed)
Problem: Suicide  Goal: *STG: Remains safe in hospital  Outcome: Progressing Towards Goal  Goal: *STG: Attends activities and groups  Outcome: Progressing Towards Goal     Problem: Depressed Mood (Adult/Pediatric)  Goal: *STG: Verbalizes anger, guilt, and other feelings in a constructive manor  Outcome: Progressing Towards Goal       0700: Shift change report given to Abigail K. (oncoming nurse) by Linda P. (offgoing nurse). Report included the following information SBAR, Kardex, MAR and Recent Results.     0800-1000: Assumed care of patient. Patient did not allow writer to ask assessment questions, began yelling at writer. Writer attempted to ask a question, patient ignored it.  Writer took VS, patient did not allow for temperature to be taken. Patient appears agitated with writer. Patient to dayroom for meals.     1000-1200: Patient did not attend groups. Patient in bed sleeping. No signs of distress noted.    1200-1400: Patient to dayroom for lunch. Patient in dayroom sitting quietly coloring.    1400-1600: Patient in dayroom coloring. No voiced concerns.    1600-1800: Meal compliant. Patient refused BP to be taken, Hydralazine held.     1800-1900: Patient resting quietly in bed.

## 2019-11-20 NOTE — Group Note (Cosign Needed)
IP BH GROUP DOCUMENTATION INDIVIDUAL                                                                          Group Therapy Note    Date: 11/20/2019    Group Start Time: 1100  Group End Time: 1200  Group Topic: Topic Group    [facility]    Baker, Beverly S    IP BH GROUP DOCUMENTATION GROUP    Group Therapy Note    Attendees: 4         Attendance: Did not attend    Patient's Goal:      Interventions/techniques  Beverly S Baker

## 2019-11-20 NOTE — Behavioral Health Treatment Team (Signed)
PSYCHIATRIC PROGRESS NOTE         Patient Name  Julian [name]   Date of Birth [birth year]   CSN [id number]   Medical Record Number  [id number]      Age  59 y.o.   PCP Other, Phys, MD   Admit date:  11/15/2019    Room Number  325/01  @ [facility]   Date of Service  11/20/2019         E & M PROGRESS NOTE:         HISTORY       CC:  "suicidal ideation"  HISTORY OF PRESENT ILLNESS/INTERVAL HISTORY:  (reviewed/updated 11/20/2019).  per initial evaluation: The patient, Julian [name], is a 59 y.o.  BLACK/[place] AMER[place] male with a past psychiatric history significant for MDD, who presents at this time with complaints of (and/or evidence of) the following emotional symptoms: depression and suicidal thoughts/threats.  Additional symptomatology include family stressors.  The above symptoms have been present for 2+ days. These symptoms are of moderate to high severity. These symptoms are intermittent/ fleeting in nature.  The patient's condition has been precipitated by psychosocial stressors.  Patient's condition made worse by treatment noncompliance. UDS: negative; BAL=0.  ??  Per admission documentation, patient reported a desire to run into traffic leading to the current presentation he states that a family member urged him to come in. He reports conditional suicidality and denies prior hospitalizations.  ??  The patient is an unreliable historian. The patient corroborates the above narrative. The patient contracts for safety on the unit and gives consent for the team to contact collateral. The patient is amenable to initiating treatment while on the unit. The patient states that he stopped taking his Wellbutrin due to frustration with his family, he is evasive about the circumstances preceding this situation. The patient reports he lives alone but per SW assessment has been staying on couches.    4/04 - the patient slept 7.5 hours, remains irritable but calm and cooperative. Patient had a disagreement with his  roommate, and was moved to a different room where he is doing better. Patient denies active thoughts of self harm, but endorses passive SI and anxiety; depression persists. Patient medication compliant, able to make needs known.??    4/05 - no acute overnight events. Patient slept 6.5 hours, has been irritable with staff, endorsing ongoing conditional suicidality - he states that if discharged he will attempt to hurt himself. Patient reports anxiety and depression but denies active thoughts of self harm. Patient medication compliant, no PRNs given for agitation x24 hours. Patient smiling, cooperative with peers and in no apparent distress. He denies making conditionally suicidal statements.    4/06 - patient slept 7 hours overnight, he continues to endorse passive SI but is calm and pleasant, interacting appropriately with staff, attending groups. Patient irritable with nursing staff, reports ongoing anxiety but contracts for safety. SW working to ascertain his disposition options, as there is disagreement in the charts are to what his current housing situation is. Patient denies SI/HI/AVH/PI.    4/07 - no acute overnight events. Patient visible, pleasant, cooperative with treatment. He continues to endorse SI, passive and intermittent, but acknowledges on interview that he has been transient and is hoping to have more help with housing. Patient given 48 hours notice, plan to discharge to shelter Friday. Patient given housing resources, voices understanding.    4/08 - patient slept 7 hours overnight, he remains irritable but cooperative with  treatment. Patient still endorsing some passive SI but denies active thoughts of self harm. Patient for CSU placement tomorrow AM. SW coordinating disposition. He denies HI/AVH/PI.        SIDE EFFECTS: (reviewed/updated 11/20/2019)  None reported or admitted to.     ALLERGIES:(reviewed/updated 11/20/2019)  Allergies   Allergen Reactions   ??? Ace Inhibitors Cough      MEDICATIONS PRIOR  TO ADMISSION:(reviewed/updated 11/20/2019)  Medications Prior to Admission   Medication Sig   ??? buPROPion XL (WELLBUTRIN XL) 150 mg tablet Take 150 mg by mouth daily. Indications: major depressive disorder, Patient reports last dose x 4 days ago.   ??? hydrOXYzine HCL (ATARAX) 50 mg tablet Take 50 mg by mouth two (2) times a day. Indications: anxious, Patient reports last dose x 4 days ago   ??? naproxen (NAPROSYN) 500 mg tablet Take 500 mg by mouth two (2) times daily as needed for Pain. Indications: pain   ??? hydrALAZINE (APRESOLINE) 50 mg tablet Take 50 mg by mouth two (2) times a day. Indications: high blood pressure, Patient reports last dose x 2 weeks ago.   ??? losartan-hydroCHLOROthiazide (HYZAAR) 100-25 mg per tablet Take 1 Tab by mouth daily. Indications: high blood pressure, Patient reports last dose x 2 weeks ago   ??? pregabalin (LYRICA) 50 mg capsule Take 50 mg by mouth two (2) times a day. Indications: neuropathic pain   ??? polyethylene glycol (Miralax) 17 gram/dose powder Take 17 g by mouth daily. Indications: constipation   ??? atorvastatin (LIPITOR) 40 mg tablet Take 40 mg by mouth nightly. Indications: excessive fat in the blood   ??? hydrocortisone (CORTAID) 1 % topical cream Apply  to affected area two (2) times daily as needed for Skin Irritation. use thin layer   Indications: inflammation of the skin due to an allergy   ??? lidocaine (XYLOCAINE) 4 % topical cream Apply  to affected area two (2) times daily as needed for Pain. Indications: skin irritation   ??? green tea leaf extract (GREEN TEA PO) Take 500 mg by mouth. Indications: herbal supplement   ??? B-complex with vitamin C (VITAMIN B COMPLEX-C PO) Take 1 Tab by mouth. Indications: vitamin B deficiency   ??? Panax ginseng root (GINSENG KOREAN PO) Take 100 mg by mouth.   ??? eucalypt/men/camph/turp/wh.pet (MEDICATED CHEST RUB EX) by Apply Externally route as needed for Cough.   ??? diphenhydrAMINE-zinc acetate 2%-0.1% (BENADRYL) 2-0.1 % topical cream Apply  to  affected area two (2) times daily as needed for Itching.   ??? guaiFENesin (ORGANIDIN) 400 mg tablet Take  by mouth every four (4) hours as needed for Congestion.   ??? ferrous sulfate 325 mg (65 mg iron) tablet Take  by mouth Daily (before breakfast). Indications: anemia from inadequate iron   ??? miconazole nitrate 2 % aerp by Apply Externally route as needed (athlete's foot). Indications: athlete's foot   ??? ginkgo biloba 120 mg tab Take 1 Tab by mouth. Indications: herbal supplement   ??? trolamine salicylate (Arthricream) 10 % topical cream Apply  to affected area as needed for Stiffness.   ??? methyl salicylate/menthol (BEN GAY EX) by Apply Externally route as needed.   ??? acetaminophen (TYLENOL) 500 mg tablet Take 1 Tab by mouth every six (6) hours as needed for Pain. (Patient taking differently: Take 500 mg by mouth every six (6) hours as needed for Pain. Indications: pain)      PAST MEDICAL HISTORY: Past medical history from the initial psychiatric evaluation has been reviewed (  reviewed/updated 11/20/2019) with no additional updates (I asked patient and no additional past medical history provided).   Past Medical History:   Diagnosis Date   ??? Anxiety    ??? Chronic constipation    ??? Diabetes (HCC)    ??? Hypertension    ??? Major depression    ??? OCD (obsessive compulsive disorder)    History reviewed. No pertinent surgical history.   SOCIAL HISTORY: Social history from the initial psychiatric evaluation has been reviewed (reviewed/updated 11/20/2019) with no additional updates (I asked patient and no additional social history provided).   Social History     Socioeconomic History   ??? Marital status: SINGLE     Spouse name: Not on file   ??? Number of children: Not on file   ??? Years of education: Not on file   ??? Highest education level: Not on file   Occupational History   ??? Not on file   Social Needs   ??? Financial resource strain: Not on file   ??? Food insecurity     Worry: Not on file     Inability: Not on file   ??? Transportation  needs     Medical: Not on file     Non-medical: Not on file   Tobacco Use   ??? Smoking status: Never Smoker   ??? Smokeless tobacco: Never Used   Substance and Sexual Activity   ??? Alcohol use: No     Frequency: Never   ??? Drug use: No   ??? Sexual activity: Not Currently   Lifestyle   ??? Physical activity     Days per week: Not on file     Minutes per session: Not on file   ??? Stress: Not on file   Relationships   ??? Social Wellsite geologist on phone: Not on file     Gets together: Not on file     Attends religious service: Not on file     Active member of club or organization: Not on file     Attends meetings of clubs or organizations: Not on file     Relationship status: Not on file   ??? Intimate partner violence     Fear of current or ex partner: Not on file     Emotionally abused: Not on file     Physically abused: Not on file     Forced sexual activity: Not on file   Other Topics Concern   ??? Not on file   Social History Narrative   ??? Not on file      FAMILY HISTORY: Family history from the initial psychiatric evaluation has been reviewed (reviewed/updated 11/20/2019) with no additional updates (I asked patient and no additional family history provided). History reviewed. No pertinent family history.    REVIEW OF SYSTEMS: (reviewed/updated 11/20/2019)  Appetite:improved   Sleep: no change   All other Review of Systems: Negative except per HPI         MENTAL STATUS EXAM & VITALS     MENTAL STATUS EXAM (MSE):    MSE FINDINGS ARE WITHIN NORMAL LIMITS (WNL) UNLESS OTHERWISE STATED BELOW. ( ALL OF THE BELOW CATEGORIES OF THE MSE HAVE BEEN REVIEWED (reviewed 11/20/2019) AND UPDATED AS DEEMED APPROPRIATE )  General Presentation age appropriate, cooperative and guarded   Orientation oriented to time, place and person   Vital Signs  See below (reviewed 11/20/2019); Vital Signs (BP, Pulse, & Temp) are within normal limits if not listed below.  Gait and Station Stable/steady, no ataxia   Musculoskeletal System No extrapyramidal  symptoms (EPS); no abnormal muscular movements or Tardive Dyskinesia (TD); muscle strength and tone are within normal limits   Language No aphasia or dysarthria   Speech:  normal volume and non-pressured   Thought Processes logical; normal rate of thoughts; fair abstract reasoning/computation   Thought Associations goal directed   Thought Content preoccupations   Suicidal Ideations contracts for safety   Homicidal Ideations none   Mood:  depressed   Affect:  euthymic and mood-incongruent   Memory recent  intact   Memory remote:  intact   Concentration/Attention:  intact   Fund of Knowledge average   Insight:  limited   Reliability poor   Judgment:  fair          VITALS:     Patient Vitals for the past 24 hrs:   Temp Pulse Resp BP SpO2   11/20/19 0745 ??? 96 18 (!) 143/92 96 %   11/19/19 1942 98.5 ??F (36.9 ??C) 98 18 (!) 146/90 97 %   11/19/19 1800 ??? (!) 109 ??? (!) 153/141 100 %     Wt Readings from Last 3 Encounters:   11/15/19 90.7 kg (200 lb)     Temp Readings from Last 3 Encounters:   11/19/19 98.5 ??F (36.9 ??C)   07/01/17 97.5 ??F (36.4 ??C)     BP Readings from Last 3 Encounters:   11/20/19 (!) 143/92   07/01/17 (!) 168/109     Pulse Readings from Last 3 Encounters:   11/20/19 96   07/01/17 78            DATA     LABORATORY DATA:(reviewed/updated 11/20/2019)  No results found for this or any previous visit (from the past 24 hour(s)).  No results found for: VALF2, VALAC, VALP, VALPR, DS6, CRBAM, CRBAMP, CARB2, XCRBAM  No results found for: LITHM   RADIOLOGY REPORTS:(reviewed/updated 11/20/2019)  No results found.       MEDICATIONS     ALL MEDICATIONS:   Current Facility-Administered Medications   Medication Dose Route Frequency   ??? losartan (COZAAR) tablet 100 mg  100 mg Oral DAILY    And   ??? hydroCHLOROthiazide (HYDRODIURIL) tablet 25 mg  25 mg Oral DAILY   ??? OLANZapine (ZyPREXA) tablet 5 mg  5 mg Oral Q6H PRN   ??? haloperidol lactate (HALDOL) injection 5 mg  5 mg IntraMUSCular Q6H PRN   ??? benztropine (COGENTIN) tablet 1  mg  1 mg Oral BID PRN   ??? diphenhydrAMINE (BENADRYL) injection 50 mg  50 mg IntraMUSCular BID PRN   ??? hydrOXYzine HCL (ATARAX) tablet 50 mg  50 mg Oral TID PRN   ??? LORazepam (ATIVAN) injection 1 mg  1 mg IntraMUSCular Q4H PRN   ??? traZODone (DESYREL) tablet 50 mg  50 mg Oral QHS PRN   ??? magnesium hydroxide (MILK OF MAGNESIA) 400 mg/5 mL oral suspension 30 mL  30 mL Oral DAILY PRN   ??? hydrALAZINE (APRESOLINE) tablet 50 mg  50 mg Oral BID   ??? polyethylene glycol (MIRALAX) packet 17 g  17 g Oral DAILY   ??? naproxen (NAPROSYN) tablet 500 mg  500 mg Oral Q8H PRN   ??? buPROPion XL (WELLBUTRIN XL) tablet 150 mg  150 mg Oral QHS   ??? acetaminophen (TYLENOL) tablet 650 mg  650 mg Oral Q4H PRN   ??? atorvastatin (LIPITOR) tablet 40 mg  40 mg Oral QHS   ??? pregabalin (  LYRICA) capsule 100 mg  100 mg Oral DAILY PRN      SCHEDULED MEDICATIONS:   Current Facility-Administered Medications   Medication Dose Route Frequency   ??? losartan (COZAAR) tablet 100 mg  100 mg Oral DAILY    And   ??? hydroCHLOROthiazide (HYDRODIURIL) tablet 25 mg  25 mg Oral DAILY   ??? hydrALAZINE (APRESOLINE) tablet 50 mg  50 mg Oral BID   ??? polyethylene glycol (MIRALAX) packet 17 g  17 g Oral DAILY   ??? buPROPion XL (WELLBUTRIN XL) tablet 150 mg  150 mg Oral QHS   ??? atorvastatin (LIPITOR) tablet 40 mg  40 mg Oral QHS          ASSESSMENT & PLAN     DIAGNOSES REQUIRING ACTIVE TREATMENT AND MONITORING: (reviewed/updated 11/20/2019)  Patient Active Hospital Problem List:   MDD (major depressive disorder), recurrent episode, moderate (HCC) (11/15/2019)    Assessment: patient with ongoing family stressors leading to hospitalization. No substances on board and patient denies prior episodes. He states that he went off antidepressant and is now feeling suicidal. Unclear housing situation and secondary gain may be a factor. Will work with patient to secure safe disposition plan.    Plan:  - CONTINUE Wellbutrin XL 150 mg QDAY for MDD  - IGM therapy as tolerated  - Expand database /  obtain collateral  - Dispo planning    In summary, Julian [name], is a 59 y.o.  male who presents with a severe exacerbation of the principal diagnosis of MDD (major depressive disorder), recurrent episode, moderate (HCC)    Patient's condition is improving.    Patient requires continued inpatient hospitalization for further stabilization, safety monitoring and medication management.  I will continue to coordinate the provision of individual, milieu, occupational, group, and substance abuse therapies to address target symptoms/diagnoses as deemed appropriate for the individual patient.  A coordinated, multidisplinary treatment team round was conducted with the patient (this team consists of the nurse, psychiatric unit pharmacist, Administratorsocial worker and writer).     Complete current electronic health record for patient has been reviewed today including consultant notes, ancillary staff notes, nurses and psychiatric tech notes.    Suicide risk assessment completed and patient deemed to be of low risk for suicide at this time.     The following regarding medications was addressed during rounds with patient:   the risks and benefits of the proposed medication. The patient was given the opportunity to ask questions. Informed consent given to the use of the above medications. Will continue to adjust psychiatric and non-psychiatric medications (see above "medication" section and orders section for details) as deemed appropriate & based upon diagnoses and response to treatment.     I will continue to order blood tests/labs and diagnostic tests as deemed appropriate and review results as they become available (see orders for details and above listed lab/test results).    I will order psychiatric records from previous psych hospitals to further elucidate the nature of patient's psychopathology and review once available.    I will gather additional collateral information from friends, family and o/p treatment team to further  elucidate the nature of patient's psychopathology and baselline level of psychiatric functioning.         I certify that this patient's inpatient psychiatric hospital services furnished since the previous certification were, and continue to be, required for treatment that could reasonably be expected to improve the patient's condition, or for diagnostic study, and that the patient  continues to need, on a daily basis, active treatment furnished directly by or requiring the supervision of inpatient psychiatric facility personnel. In addition, the hospital records show that services furnished were intensive treatment services, admission or related services, or equivalent services.    EXPECTED DISCHARGE DATE/DAY: 11/19/19     DISPOSITION: Home vs CSU       Signed By:   Hiram Gash, MD  11/20/2019

## 2019-11-20 NOTE — Progress Notes (Signed)
Problem: Suicide  Goal: *STG: Remains safe in hospital  Outcome: Progressing Towards Goal  Goal: *STG: Attends activities and groups  Outcome: Progressing Towards Goal     Problem: Depressed Mood (Adult/Pediatric)  Goal: *STG: Verbalizes anger, guilt, and other feelings in a constructive manor  Outcome: Progressing Towards Goal       0700: Shift change report given to Abigail K. (Cabin crew) by Lilli Light (offgoing nurse). Report included the following information SBAR, Kardex, MAR and Recent Results.     0800-1000: Assumed care of patient. Patient did not allow Clinical research associate to ask assessment questions, began yelling at Clinical research associate. Writer attempted to ask a question, patient ignored it.  Writer took VS, patient did not allow for temperature to be taken. Patient appears agitated with Clinical research associate. Patient to dayroom for meals.     1000-1200: Patient did not attend groups. Patient in bed sleeping. No signs of distress noted.    1200-1400: Patient to dayroom for lunch. Patient in dayroom sitting quietly coloring.    1400-1600: Patient in dayroom coloring. No voiced concerns.    1600-1800: Meal compliant. Patient refused BP to be taken, Hydralazine held.     1800-1900: Patient resting quietly in bed.

## 2019-11-20 NOTE — Behavioral Health Treatment Team (Signed)
 Behavioral Health Treatment Team Note     Patient goal(s) for today:Take medications as described.  Treatment team focus/goals:Medication adjustments.  Progress note:Pt was seen in treatment team this morning. Pt is alert and oriented. Pt denies SI/HI. Pt's mood isdepressed, affect isWNL. Pt's thought process iscoherent. Pt's insight and judgment islimited, reliability ispoor. Pt agreeable to stepdown to CSU upon discharge. Social work department will continue to coordinate discharge plans.     LOS:5Expected LOS: 4/9 to CSU at 10:00AM     Financial concerns/prescription coverage:[employer] Medicaid Part A&B with QMB Extended Coverage and [employer] [facility] [place] [facility] Medicaid -Voluntary  Date of last family contact:None  Family requesting physician contact today:No  Discharge plan:TBD  Guns in the home:None involved.  Outpatient provider(s):unknownat this time    Participating treatment team members:Hakiem D Dallie Hildegard Nyhan, MSW and Dr. Debbie, MD

## 2019-11-21 MED ORDER — HYDRALAZINE 50 MG TAB
50 mg | ORAL_TABLET | Freq: Two times a day (BID) | ORAL | 1 refills | Status: AC
Start: 2019-11-21 — End: ?

## 2019-11-21 MED ORDER — LOSARTAN-HYDROCHLOROTHIAZIDE 100 MG-25 MG TAB
100-25 mg | ORAL_TABLET | Freq: Every day | ORAL | 1 refills | Status: AC
Start: 2019-11-21 — End: ?

## 2019-11-21 MED ORDER — ATORVASTATIN 40 MG TAB
40 mg | ORAL_TABLET | Freq: Every evening | ORAL | 1 refills | Status: AC
Start: 2019-11-21 — End: ?

## 2019-11-21 MED ORDER — HYDROXYZINE 50 MG TAB
50 mg | ORAL_TABLET | Freq: Two times a day (BID) | ORAL | 1 refills | Status: AC | PRN
Start: 2019-11-21 — End: 2020-01-20

## 2019-11-21 MED ORDER — PREGABALIN 100 MG CAP
100 mg | ORAL_CAPSULE | Freq: Every day | ORAL | 1 refills | Status: AC | PRN
Start: 2019-11-21 — End: ?

## 2019-11-21 MED ORDER — BUPROPION XL 150 MG 24 HR TAB
150 mg | ORAL_TABLET | Freq: Every evening | ORAL | 1 refills | Status: AC
Start: 2019-11-21 — End: ?

## 2019-11-21 MED FILL — HEALTHYLAX 17 GRAM ORAL POWDER PACKET: 17 gram | ORAL | Qty: 1

## 2019-11-21 MED FILL — LOSARTAN 25 MG TAB: 25 mg | ORAL | Qty: 4

## 2019-11-21 MED FILL — BUPROPION XL 150 MG 24 HR TAB: 150 mg | ORAL | Qty: 1

## 2019-11-21 MED FILL — ATORVASTATIN 40 MG TAB: 40 mg | ORAL | Qty: 1

## 2019-11-21 MED FILL — HYDRALAZINE 50 MG TAB: 50 mg | ORAL | Qty: 1

## 2019-11-21 MED FILL — HYDROCHLOROTHIAZIDE 25 MG TAB: 25 mg | ORAL | Qty: 1

## 2019-11-21 MED FILL — HYDROXYZINE 25 MG TAB: 25 mg | ORAL | Qty: 2

## 2019-11-21 NOTE — Suicide Safety Plan (Signed)
SAFETY PLAN    A suicide Safety Plan is a document that supports someone when they are having thoughts of suicide.    Warning Signs that indicate a suicidal crisis may be developing: What (situations, thoughts, feelings, body sensations, behaviors, etc.) do you experience that lets you know you are beginning to think about suicide?  1.  Depression   2.  tearfulness   3. Thoughts of hurting self    Internal Coping Strategies:  What things can I do (relaxation techniques, hobbies, physical activities, etc.) to take my mind off my problems without contacting another person?  1. humor  2. outdoors  3. Talking to others      Professionals or Behavioral Health agencies I can contact during a crisis: Who can I call for help - for example, my doctor, my psychiatrist, my psychologist, a mental health provider, a suicide hotline?    [facility]   Address: [address], [place] [address]  Phone: [phone]  Fax: [phone]  Crisis number:  [phone]   3. Suicide Prevention Lifeline: 1-800-273-TALK (8255)    4. Local Behavioral Health Emergency Services -  for example, [facility], local county suicide hotline, [employer] Hotline: [facility]   Address: [address], [place] [address]  Phone: [phone]  Fax: [phone]  Crisis number:  [phone]   Making the environment safe: How can I make my environment (house/apartment/living space) safer? For example, can I remove guns, medications, and other items?  1. Take medications as prescribed.

## 2019-11-21 NOTE — Behavioral Health Treatment Team (Signed)
0700- Pt's nurse (Terra) took report from night shift nurse (Brittany).   0730- Pt is alert/oriented x4. Calm and Cooperative. Appropriate in the milieu. Mood is good. No PRNs given this shift. No behavioral issues. Will continue to monitor pt with q15 min rounds for safety.   0830- Pt was in bathroom having a bowel movement.  0900- Pt received medications an vital signs. Vitals WDL.

## 2019-11-21 NOTE — Progress Notes (Signed)
Suggest CC diet for pt given his history is notable for DM, HTN and his BMI is elevated.  Otherwise diet as tolerated. Enc fluid intake.  Pt meal compliant.  Hx noted above.  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

## 2019-11-21 NOTE — Progress Notes (Signed)
Problem: Depressed Mood (Adult/Pediatric)  Goal: *STG: Participates in treatment plan  Outcome: Progressing Towards Goal

## 2019-11-21 NOTE — Progress Notes (Signed)
Discharge Note:  Pt discharged from 3rd floor 10:10a    Pt denies SI/HI/A/VH.  Pt medication/diet compliant.      Pt is anxious/nervous about discharge.    RN reviewed discharge paperwork/prescriptions w/pt.  Pt understood and signed all discharge paperwork, rec'd all valuables and signed off.

## 2019-11-21 NOTE — Discharge Summary (Signed)
PSYCHIATRIC DISCHARGE SUMMARY         IDENTIFICATION:    Patient Name  Julian [name]   Date of Birth 04-Apr-1961   CSN [id number]   Medical Record Number  [id number]      Age  59 y.o.   PCP Other, Phys, MD   Admit date:  11/15/2019    Discharge date: 11/21/2019   Room Number  325/01  @ [facility]   Date of Service  11/21/2019            TYPE OF DISCHARGE: REGULAR               CONDITION AT DISCHARGE: fair and stable       PROVISIONAL & DISCHARGE DIAGNOSES:    Problem List  Never Reviewed          Codes Class    * (Principal) MDD (major depressive disorder), recurrent episode, moderate ([place]) ICD-10-CM: F33.1  ICD-9-CM: 296.32               Active Hospital Problems    *MDD (major depressive disorder), recurrent episode, moderate ([facility])        DISCHARGE DIAGNOSIS:   Axis I:  SEE ABOVE  Axis II: SEE ABOVE  Axis III: SEE ABOVE  Axis IV:  lack of structure  Axis V:  20 on admission, 55 on discharge     CC & HISTORY OF PRESENT ILLNESS:  The patient,??Julian [name], is a??59 y.o.????BLACK/AFRICAN AMERICAN??male??with a past psychiatric history significant for MDD, who presents at this time with complaints of (and/or evidence of) the following emotional symptoms:??depression and suicidal thoughts/threats. ??Additional symptomatology include family stressors. ??The above symptoms have been present for 2+ days. These symptoms are of moderate to high severity. These symptoms are??intermittent/ fleeting in nature. ??The patient's condition has been precipitated by psychosocial stressors. ??Patient's condition made worse by treatment noncompliance. UDS: negative; BAL=0.  ??  Per admission documentation, patient reported a desire to run into traffic leading to the current presentation he states that a family member urged him to come in. He reports conditional suicidality and denies prior hospitalizations.  ??  The patient is an unreliable??historian. The patient corroborates the above narrative. The patient contracts for safety on the  unit and gives consent for the team to contact collateral. The patient is amenable to initiating treatment while on the unit.??The patient states that he stopped taking his Wellbutrin due to frustration with his family, he is evasive about the circumstances preceding this situation. The patient reports he lives alone but per SW assessment has been staying on couches.  ??  4/04 - the patient slept 7.5 hours, remains irritable but calm and cooperative. Patient had a disagreement with his roommate, and was moved to a different room where he is doing better. Patient denies active thoughts of self harm, but endorses passive SI and anxiety; depression persists. Patient medication compliant, able to make needs known.??  ??  4/05 - no acute overnight events. Patient slept 6.5 hours, has been irritable with staff, endorsing ongoing conditional suicidality - he states that if discharged he will attempt to hurt himself. Patient reports anxiety and depression but denies active thoughts of self harm. Patient medication compliant, no PRNs given for agitation x24 hours. Patient smiling, cooperative with peers and in no apparent distress. He denies making conditionally suicidal statements.  ??  4/06 - patient slept 7 hours overnight, he continues to endorse passive SI but is calm and pleasant, interacting appropriately with  staff, attending groups. Patient irritable with nursing staff, reports ongoing anxiety but contracts for safety. SW working to ascertain his disposition options, as there is disagreement in the charts are to what his current housing situation is. Patient denies SI/HI/AVH/PI.  ??  4/07 - no acute overnight events. Patient visible, pleasant, cooperative with treatment. He continues to endorse SI, passive and intermittent, but acknowledges on interview that he has been transient and is hoping to have more help with housing. Patient given 48 hours notice, plan to discharge to shelter Friday. Patient given housing  resources, voices understanding.  ??  4/08 - patient slept 7 hours overnight, he remains irritable but cooperative with treatment. Patient still endorsing some passive SI but denies active thoughts of self harm. Patient for CSU placement tomorrow AM. SW coordinating disposition. He denies HI/AVH/PI.     SOCIAL HISTORY:    Social History     Socioeconomic History   ??? Marital status: SINGLE     Spouse name: Not on file   ??? Number of children: Not on file   ??? Years of education: Not on file   ??? Highest education level: Not on file   Occupational History   ??? Not on file   Social Needs   ??? Financial resource strain: Not on file   ??? Food insecurity     Worry: Not on file     Inability: Not on file   ??? Transportation needs     Medical: Not on file     Non-medical: Not on file   Tobacco Use   ??? Smoking status: Never Smoker   ??? Smokeless tobacco: Never Used   Substance and Sexual Activity   ??? Alcohol use: No     Frequency: Never   ??? Drug use: No   ??? Sexual activity: Not Currently   Lifestyle   ??? Physical activity     Days per week: Not on file     Minutes per session: Not on file   ??? Stress: Not on file   Relationships   ??? Social Product manager on phone: Not on file     Gets together: Not on file     Attends religious service: Not on file     Active member of club or organization: Not on file     Attends meetings of clubs or organizations: Not on file     Relationship status: Not on file   ??? Intimate partner violence     Fear of current or ex partner: Not on file     Emotionally abused: Not on file     Physically abused: Not on file     Forced sexual activity: Not on file   Other Topics Concern   ??? Not on file   Social History Narrative   ??? Not on file      FAMILY HISTORY:   History reviewed. No pertinent family history.          HOSPITALIZATION COURSE:    Julian [name] was admitted to the inpatient psychiatric unit [facility] for acute psychiatric stabilization in regards to symptomatology as  described in the HPI above. The differential diagnosis at time of admission included: MDD vs adjustment disorder.  While on the unit Julian [name] was involved in individual, group, occupational and milieu therapy.  Psychiatric medications were adjusted during this hospitalization including Wellbutrin.   [place] demonstrated a slow, but progressive improvement in overall condition.  Much of patient's initial presentation appeared to be related to situational stressors, effects of medication non-compliance and psychological factors.  Please see individual progress notes for more specific details regarding patient's hospitalization course.     Patient with request for discharge today. There are no grounds to seek a TDO.    At time of discharge, Julian [name] is without significant problems of depression, psychosis, or mania. Patient free of suicidal and homicidal ideations (appears to be at very low risk of suicide or homicide) and reports many positive predictive factors in terms of not attempting suicide or homicide. Overall presentation at time of discharge is most consistent with the diagnosis of MDD, recurrent moderate.    Patient has maximized benefit to be derived from acute inpatient psychiatric treatment.  All members of the treatment team concur with each other in regards to plans for discharge today. Patient aware and in agreement with discharge and discharge plan.         LABS AND IMAGAING:    Labs Reviewed   CBC WITH AUTOMATED DIFF - Abnormal; Notable for the following components:       Result Value    IMMATURE GRANULOCYTES 1 (*)     All other components within normal limits   METABOLIC PANEL, COMPREHENSIVE - Abnormal; Notable for the following components:    Glucose 124 (*)     Creatinine 1.32 (*)     GFR est non-AA 56 (*)     AST (SGOT) 50 (*)     A-G Ratio 0.9 (*)     All other components within normal limits   DRUG SCREEN, URINE   URINALYSIS W/ REFLEX CULTURE   ETHYL ALCOHOL   SARS-COV-2    COVID-19 WITH INFLUENZA A/B     No results found for: DS35, PHEN, PHENO, PHENT, DILF, DS39, PHENY, PTN, VALF2, VALAC, VALP, VALPR, DS6, CRBAM, CRBAMP, CARB2, XCRBAM  Admission on 11/15/2019, Discharged on 11/21/2019   Component Date Value Ref Range Status   ??? AMPHETAMINES 11/15/2019 Negative  NEG   Final   ??? BARBITURATES 11/15/2019 Negative  NEG   Final   ??? BENZODIAZEPINES 11/15/2019 Negative  NEG   Final   ??? COCAINE 11/15/2019 Negative  NEG   Final   ??? METHADONE 11/15/2019 Negative  NEG   Final   ??? OPIATES 11/15/2019 Negative  NEG   Final   ??? PCP(PHENCYCLIDINE) 11/15/2019 Negative  NEG   Final   ??? THC (TH-CANNABINOL) 11/15/2019 Negative  NEG   Final   ??? Drug screen comment 11/15/2019 (NOTE)   Final   ??? Color 11/15/2019 YELLOW/STRAW    Final   ??? Appearance 11/15/2019 CLEAR  CLEAR   Final   ??? Specific gravity 11/15/2019 1.010  1.003 - 1.030   Final   ??? pH (UA) 11/15/2019 7.0  5.0 - 8.0   Final   ??? Protein 11/15/2019 Negative  NEG mg/dL Final   ??? Glucose 11/15/2019 Negative  NEG mg/dL Final   ??? Ketone 11/15/2019 Negative  NEG mg/dL Final   ??? Bilirubin 11/15/2019 Negative  NEG   Final   ??? Blood 11/15/2019 Negative  NEG   Final   ??? Urobilinogen 11/15/2019 1.0  0.2 - 1.0 EU/dL Final   ??? Nitrites 11/15/2019 Negative  NEG   Final   ??? Leukocyte Esterase 11/15/2019 Negative  NEG   Final   ??? WBC 11/15/2019 0-4  0 - 4 /hpf Final   ??? RBC 11/15/2019 0-5  0 - 5 /hpf Final   ???  Epithelial cells 11/15/2019 FEW  FEW /lpf Final   ??? Bacteria 11/15/2019 Negative  NEG /hpf Final   ??? UA:UC IF INDICATED 11/15/2019 CULTURE NOT INDICATED BY UA RESULT  CNI   Final   ??? ALCOHOL(ETHYL),SERUM 11/15/2019 <10  <10 MG/DL Final   ??? WBC 11/15/2019 7.2  4.1 - 11.1 K/uL Final   ??? RBC 11/15/2019 4.37  4.10 - 5.70 M/uL Final   ??? HGB 11/15/2019 12.5  12.1 - 17.0 g/dL Final   ??? HCT 11/15/2019 37.0  36.6 - 50.3 % Final   ??? MCV 11/15/2019 84.7  80.0 - 99.0 FL Final   ??? MCH 11/15/2019 28.6  26.0 - 34.0 PG Final   ??? MCHC 11/15/2019 33.8  30.0 - 36.5 g/dL Final    ??? RDW 11/15/2019 13.3  11.5 - 14.5 % Final   ??? PLATELET 11/15/2019 248  150 - 400 K/uL Final   ??? MPV 11/15/2019 10.3  8.9 - 12.9 FL Final   ??? NRBC 11/15/2019 0.0  0 PER 100 WBC Final   ??? ABSOLUTE NRBC 11/15/2019 0.00  0.00 - 0.01 K/uL Final   ??? NEUTROPHILS 11/15/2019 55  32 - 75 % Final   ??? LYMPHOCYTES 11/15/2019 29  12 - 49 % Final   ??? MONOCYTES 11/15/2019 9  5 - 13 % Final   ??? EOSINOPHILS 11/15/2019 5  0 - 7 % Final   ??? BASOPHILS 11/15/2019 1  0 - 1 % Final   ??? IMMATURE GRANULOCYTES 11/15/2019 1* 0.0 - 0.5 % Final   ??? ABS. NEUTROPHILS 11/15/2019 4.0  1.8 - 8.0 K/UL Final   ??? ABS. LYMPHOCYTES 11/15/2019 2.1  0.8 - 3.5 K/UL Final   ??? ABS. MONOCYTES 11/15/2019 0.7  0.0 - 1.0 K/UL Final   ??? ABS. EOSINOPHILS 11/15/2019 0.3  0.0 - 0.4 K/UL Final   ??? ABS. BASOPHILS 11/15/2019 0.1  0.0 - 0.1 K/UL Final   ??? ABS. IMM. GRANS. 11/15/2019 0.0  0.00 - 0.04 K/UL Final   ??? DF 11/15/2019 AUTOMATED    Final   ??? Sodium 11/15/2019 141  136 - 145 mmol/L Final   ??? Potassium 11/15/2019 4.3  3.5 - 5.1 mmol/L Final   ??? Chloride 11/15/2019 103  97 - 108 mmol/L Final   ??? CO2 11/15/2019 29  21 - 32 mmol/L Final   ??? Anion gap 11/15/2019 9  5 - 15 mmol/L Final   ??? Glucose 11/15/2019 124* 65 - 100 mg/dL Final   ??? BUN 11/15/2019 16  6 - 20 MG/DL Final   ??? Creatinine 11/15/2019 1.32* 0.70 - 1.30 MG/DL Final   ??? BUN/Creatinine ratio 11/15/2019 12  12 - 20   Final   ??? GFR est AA 11/15/2019 >60  >60 ml/min/1.106m Final   ??? GFR est non-AA 11/15/2019 56* >60 ml/min/1.776mFinal   ??? Calcium 11/15/2019 9.0  8.5 - 10.1 MG/DL Final   ??? Bilirubin, total 11/15/2019 0.3  0.2 - 1.0 MG/DL Final   ??? ALT (SGPT) 11/15/2019 73  12 - 78 U/L Final   ??? AST (SGOT) 11/15/2019 50* 15 - 37 U/L Final   ??? Alk. phosphatase 11/15/2019 71  45 - 117 U/L Final   ??? Protein, total 11/15/2019 7.2  6.4 - 8.2 g/dL Final   ??? Albumin 11/15/2019 3.5  3.5 - 5.0 g/dL Final   ??? Globulin 11/15/2019 3.7  2.0 - 4.0 g/dL Final   ??? A-G Ratio 11/15/2019 0.9* 1.1 - 2.2   Final   ???  SARS-CoV-2  11/15/2019 Please find results under separate order    Final   ??? SARS-CoV-2 11/15/2019 Not detected  NOTD   Final   ??? Influenza A by PCR 11/15/2019 Not detected  NOTD   Final   ??? Influenza B by PCR 11/15/2019 Not detected  NOTD   Final     No results found.                DISPOSITION:    Home. Patient to f/u with psychiatric and psychotherapy appointments. Patient is to f/u with internist as directed.                FOLLOW-UP CARE:    Activity as tolerated  Regular diet  Wound Care: none needed.  Follow-up Information     Follow up With Specialties Details Why Contact Info    [place] today This is the crisis stabilization program you will be stepping down to upon discharge.  [address] [place] [address] [place] [address]  [place]   Call today Please call [facility] phone line [phone] between 8:00AM -2:00PM to complete intake assessment for ongoing mental health case management, therapy and medication management. Please inquire about medical services through the [facility] clinic. Address: [address] [place] [address]  Phone: [phone]  Fax: [phone]  Crisis number:  [phone]       Other, Phys, MD    Patient can only remember the practice name and not the physician                   PROGNOSIS:   Fair ---- based on nature of patient's pathology/ies and treatment compliance issues.  Prognosis is greatly dependent upon patient's ability to remain sober and to follow up with scheduled appointments as well as to comply with psychiatric medications as prescribed.            DISCHARGE MEDICATIONS:     Informed consent given for the use of following psychotropic medications:  Discharge Medication List as of 11/21/2019  9:56 AM      CONTINUE these medications which have CHANGED    Details   losartan-hydroCHLOROthiazide (HYZAAR) 100-25 mg per tablet Take 1 Tab by mouth daily. Indications: high blood pressure, Patient reports last dose x 2 weeks  ago, Print, Disp-30 Tab, R-1      atorvastatin (LIPITOR) 40 mg tablet Take 1 Tab by mouth nightly. Indications: excessive fat in the blood, Print, Disp-30 Tab, R-1      buPROPion XL (WELLBUTRIN XL) 150 mg tablet Take 1 Tab by mouth nightly. Indications: anxiousness associated with depression, Print, Disp-30 Tab, R-1      hydrALAZINE (APRESOLINE) 50 mg tablet Take 1 Tab by mouth two (2) times a day. Indications: high blood pressure, Print, Disp-30 Tab, R-1      hydrOXYzine HCL (ATARAX) 50 mg tablet Take 1 Tab by mouth two (2) times daily as needed for Anxiety for up to 60 days. Indications: anxious, Print, Disp-60 Tab, R-1      pregabalin (LYRICA) 100 mg capsule Take 1 Cap by mouth daily as needed (Neuropathic pain). Indications: Neuropathy, Print, Disp-30 Cap, R-1         CONTINUE these medications which have NOT CHANGED    Details   naproxen (NAPROSYN) 500 mg tablet Take 500 mg by mouth two (2) times daily as needed for Pain. Indications: pain, Historical Med  polyethylene glycol (Miralax) 17 gram/dose powder Take 17 g by mouth daily. Indications: constipation, Historical Med      hydrocortisone (CORTAID) 1 % topical cream Apply  to affected area two (2) times daily as needed for Skin Irritation. use thin layer   Indications: inflammation of the skin due to an allergy, Historical Med      lidocaine (XYLOCAINE) 4 % topical cream Apply  to affected area two (2) times daily as needed for Pain. Indications: skin irritation, Historical Med      green tea leaf extract (GREEN TEA PO) Take 500 mg by mouth. Indications: herbal supplement, Historical Med      B-complex with vitamin C (VITAMIN B COMPLEX-C PO) Take 1 Tab by mouth. Indications: vitamin B deficiency, Historical Med      eucalypt/men/camph/turp/wh.pet (MEDICATED CHEST RUB EX) by Apply Externally route as needed for Cough., Historical Med      diphenhydrAMINE-zinc acetate 2%-0.1% (BENADRYL) 2-0.1 % topical cream Apply  to affected area two (2) times daily as  needed for Itching., Historical Med      guaiFENesin (ORGANIDIN) 400 mg tablet Take  by mouth every four (4) hours as needed for Congestion., Historical Med      ferrous sulfate 325 mg (65 mg iron) tablet Take  by mouth Daily (before breakfast). Indications: anemia from inadequate iron, Historical Med      ginkgo biloba 120 mg tab Take 1 Tab by mouth. Indications: herbal supplement, Historical Med      methyl salicylate/menthol (BEN GAY EX) by Apply Externally route as needed., Historical Med      acetaminophen (TYLENOL) 500 mg tablet Take 1 Tab by mouth every six (6) hours as needed for Pain., Print, Disp-60 Tab, R-0         STOP taking these medications       Panax ginseng root (GINSENG KOREAN PO) Comments:   Reason for Stopping:         miconazole nitrate 2 % aerp Comments:   Reason for Stopping:         trolamine salicylate (Arthricream) 10 % topical cream Comments:   Reason for Stopping:         tolnaftate (TINACTIN) 1 % topical cream Comments:   Reason for Stopping:                      A coordinated, multidisplinary treatment team round was conducted with Audry Pili D Carroll---this is done daily here at [facility]. This team consists of the nurse, psychiatric unit pharmacist, Catering manager.     I have spent greater than 35 minutes on discharge work.    Signed:  Raeford Razor, MD  11/21/2019

## 2019-11-21 NOTE — Behavioral Health Treatment Team (Signed)
Behavioral Health Transition Record to Provider    Patient Name: Julian [name]  Date of Birth: 04/16/1961  Medical Record Number: [id number]  Date of Admission: 11/15/2019  Date of Discharge: 11/21/2019    Attending Provider: Mbadugha, Ositadinma L, *  Discharging Provider: Ositadinma Mbadugha, MD  To contact this individual call [phone] and ask the operator to page.  If unavailable, ask to be transferred to Behavioral Health Provider on call.  A Behavioral Health Provider will be available on call 24/7 and during holidays.    Primary Care Provider: Other, Phys, MD    Allergies   Allergen Reactions   ??? Ace Inhibitors Cough       Reason for Admission: MDD    Admission Diagnosis: Major depressive disorder [F32.9]    * No surgery found *    Results for orders placed or performed during the hospital encounter of 11/15/19   DRUG SCREEN, URINE   Result Value Ref Range    AMPHETAMINES Negative NEG      BARBITURATES Negative NEG      BENZODIAZEPINES Negative NEG      COCAINE Negative NEG      METHADONE Negative NEG      OPIATES Negative NEG      PCP(PHENCYCLIDINE) Negative NEG      THC (TH-CANNABINOL) Negative NEG      Drug screen comment (NOTE)    URINALYSIS W/ REFLEX CULTURE    Specimen: Urine   Result Value Ref Range    Color YELLOW/STRAW      Appearance CLEAR CLEAR      Specific gravity 1.010 1.003 - 1.030      pH (UA) 7.0 5.0 - 8.0      Protein Negative NEG mg/dL    Glucose Negative NEG mg/dL    Ketone Negative NEG mg/dL    Bilirubin Negative NEG      Blood Negative NEG      Urobilinogen 1.0 0.2 - 1.0 EU/dL    Nitrites Negative NEG      Leukocyte Esterase Negative NEG      WBC 0-4 0 - 4 /hpf    RBC 0-5 0 - 5 /hpf    Epithelial cells FEW FEW /lpf    Bacteria Negative NEG /hpf    UA:UC IF INDICATED CULTURE NOT INDICATED BY UA RESULT CNI     ETHYL ALCOHOL   Result Value Ref Range    ALCOHOL(ETHYL),SERUM <10 <10 MG/DL   CBC WITH AUTOMATED DIFF   Result Value Ref Range    WBC 7.2 4.1 - 11.1 K/uL    RBC 4.37 4.10 - 5.70 M/uL     HGB 12.5 12.1 - 17.0 g/dL    HCT 37.0 36.6 - 50.3 %    MCV 84.7 80.0 - 99.0 FL    MCH 28.6 26.0 - 34.0 PG    MCHC 33.8 30.0 - 36.5 g/dL    RDW 13.3 11.5 - 14.5 %    PLATELET 248 150 - 400 K/uL    MPV 10.3 8.9 - 12.9 FL    NRBC 0.0 0 PER 100 WBC    ABSOLUTE NRBC 0.00 0.00 - 0.01 K/uL    NEUTROPHILS 55 32 - 75 %    LYMPHOCYTES 29 12 - 49 %    MONOCYTES 9 5 - 13 %    EOSINOPHILS 5 0 - 7 %    BASOPHILS 1 0 - 1 %    IMMATURE GRANULOCYTES 1 (H) 0.0 - 0.5 %      ABS. NEUTROPHILS 4.0 1.8 - 8.0 K/UL    ABS. LYMPHOCYTES 2.1 0.8 - 3.5 K/UL    ABS. MONOCYTES 0.7 0.0 - 1.0 K/UL    ABS. EOSINOPHILS 0.3 0.0 - 0.4 K/UL    ABS. BASOPHILS 0.1 0.0 - 0.1 K/UL    ABS. IMM. GRANS. 0.0 0.00 - 0.04 K/UL    DF AUTOMATED     METABOLIC PANEL, COMPREHENSIVE   Result Value Ref Range    Sodium 141 136 - 145 mmol/L    Potassium 4.3 3.5 - 5.1 mmol/L    Chloride 103 97 - 108 mmol/L    CO2 29 21 - 32 mmol/L    Anion gap 9 5 - 15 mmol/L    Glucose 124 (H) 65 - 100 mg/dL    BUN 16 6 - 20 MG/DL    Creatinine 1.32 (H) 0.70 - 1.30 MG/DL    BUN/Creatinine ratio 12 12 - 20      GFR est AA >60 >60 ml/min/1.73m2    GFR est non-AA 56 (L) >60 ml/min/1.73m2    Calcium 9.0 8.5 - 10.1 MG/DL    Bilirubin, total 0.3 0.2 - 1.0 MG/DL    ALT (SGPT) 73 12 - 78 U/L    AST (SGOT) 50 (H) 15 - 37 U/L    Alk. phosphatase 71 45 - 117 U/L    Protein, total 7.2 6.4 - 8.2 g/dL    Albumin 3.5 3.5 - 5.0 g/dL    Globulin 3.7 2.0 - 4.0 g/dL    A-G Ratio 0.9 (L) 1.1 - 2.2     SARS-COV-2   Result Value Ref Range    SARS-CoV-2 Please find results under separate order     COVID-19 WITH INFLUENZA A/B   Result Value Ref Range    SARS-CoV-2 Not detected NOTD      Influenza A by PCR Not detected NOTD      Influenza B by PCR Not detected NOTD         Immunizations administered during this encounter:   There is no immunization history on file for this patient.    Screening for Metabolic Disorders for Patients on Antipsychotic Medications  (Data obtained from the EMR)    Estimated Body Mass  Index  Estimated body mass index is 31.32 kg/m?? as calculated from the following:    Height as of this encounter: 5' 7" (1.702 m).    Weight as of this encounter: 90.7 kg (200 lb).     Vital Signs/Blood Pressure  Visit Vitals  BP (!) 144/82 (BP 1 Location: Left arm, BP Patient Position: Sitting)   Pulse 99   Temp 98.3 ??F (36.8 ??C)   Resp 16   Ht 5' 7" (1.702 m)   Wt 90.7 kg (200 lb)   SpO2 96%   BMI 31.32 kg/m??       Blood Glucose/Hemoglobin A1c  Lab Results   Component Value Date/Time    Glucose 124 (H) 11/15/2019 02:14 AM       No results found for: HBA1C, HGBE8, HBA1CEXT     Lipid Panel  No results found for: CHOL, CHOLX, CHLST, CHOLV, 884269, HDL, HDLP, LDL, LDLC, DLDLP, TGLX, TRIGL, TRIGP, CHHD, CHHDX     Discharge Diagnosis: Please refer to physician's discharge summary.     Discharge Plan:   The patient Jake D Trowbridge exhibits the ability to control behavior in a less restrictive environment.  Patient's level of functioning is improving.  No assaultive/destructive behavior has been observed for   the past 24 hours.  No suicidal/homicidal threat or behavior has been observed for the past 24 hours.  There is no evidence of serious medication side effects.  Patient has not been in physical or protective restraints for at least the past 24 hours.      Discharge Medication List and Instructions:   Current Discharge Medication List      CONTINUE these medications which have CHANGED    Details   losartan-hydroCHLOROthiazide (HYZAAR) 100-25 mg per tablet Take 1 Tab by mouth daily. Indications: high blood pressure, Patient reports last dose x 2 weeks ago  Qty: 30 Tab, Refills: 1      atorvastatin (LIPITOR) 40 mg tablet Take 1 Tab by mouth nightly. Indications: excessive fat in the blood  Qty: 30 Tab, Refills: 1      buPROPion XL (WELLBUTRIN XL) 150 mg tablet Take 1 Tab by mouth nightly. Indications: anxiousness associated with depression  Qty: 30 Tab, Refills: 1      hydrALAZINE (APRESOLINE) 50 mg tablet Take 1 Tab by  mouth two (2) times a day. Indications: high blood pressure  Qty: 30 Tab, Refills: 1      hydrOXYzine HCL (ATARAX) 50 mg tablet Take 1 Tab by mouth two (2) times daily as needed for Anxiety for up to 60 days. Indications: anxious  Qty: 60 Tab, Refills: 1      pregabalin (LYRICA) 100 mg capsule Take 1 Cap by mouth daily as needed (Neuropathic pain). Indications: Neuropathy  Qty: 30 Cap, Refills: 1    Associated Diagnoses: Neuropathy         CONTINUE these medications which have NOT CHANGED    Details   naproxen (NAPROSYN) 500 mg tablet Take 500 mg by mouth two (2) times daily as needed for Pain. Indications: pain      polyethylene glycol (Miralax) 17 gram/dose powder Take 17 g by mouth daily. Indications: constipation      hydrocortisone (CORTAID) 1 % topical cream Apply  to affected area two (2) times daily as needed for Skin Irritation. use thin layer   Indications: inflammation of the skin due to an allergy      lidocaine (XYLOCAINE) 4 % topical cream Apply  to affected area two (2) times daily as needed for Pain. Indications: skin irritation      green tea leaf extract (GREEN TEA PO) Take 500 mg by mouth. Indications: herbal supplement      B-complex with vitamin C (VITAMIN B COMPLEX-C PO) Take 1 Tab by mouth. Indications: vitamin B deficiency      eucalypt/men/camph/turp/wh.pet (MEDICATED CHEST RUB EX) by Apply Externally route as needed for Cough.      diphenhydrAMINE-zinc acetate 2%-0.1% (BENADRYL) 2-0.1 % topical cream Apply  to affected area two (2) times daily as needed for Itching.      guaiFENesin (ORGANIDIN) 400 mg tablet Take  by mouth every four (4) hours as needed for Congestion.      ferrous sulfate 325 mg (65 mg iron) tablet Take  by mouth Daily (before breakfast). Indications: anemia from inadequate iron      ginkgo biloba 120 mg tab Take 1 Tab by mouth. Indications: herbal supplement      methyl salicylate/menthol (BEN GAY EX) by Apply Externally route as needed.      acetaminophen (TYLENOL) 500  mg tablet Take 1 Tab by mouth every six (6) hours as needed for Pain.  Qty: 60 Tab, Refills: 0         STOP taking   these medications       Panax ginseng root (GINSENG KOREAN PO) Comments:   Reason for Stopping:         miconazole nitrate 2 % aerp Comments:   Reason for Stopping:         trolamine salicylate (Arthricream) 10 % topical cream Comments:   Reason for Stopping:         tolnaftate (TINACTIN) 1 % topical cream Comments:   Reason for Stopping:               Unresulted Labs (24h ago, onward)    None        To obtain results of studies pending at discharge, please contact N/A.    Follow-up Information     Follow up With Specialties Details Why Contact Info    [facility]   Go today This is the crisis stabilization program you will be stepping down to upon discharge.  [address]  [place] [address]  [phone]    [facility]   Call today Please call Rapid Access phone line [phone] between 8:00AM -2:00PM to complete intake assessment for ongoing mental health case management, therapy and medication management. Please inquire about medical services through the [facility] clinic. Address: [address], [place] [address]  Phone: [phone]  Fax: [phone]  Crisis number:  [phone]       Other, Phys, MD    Patient can only remember the practice name and not the physician            Advanced Directive:   Does the patient have an appointed surrogate decision maker? Unknown   Does the patient have a Medical Advance Directive? Unknown   Does the patient have a Psychiatric Advance Directive? Unknown   If the patient does not have a surrogate or Medical Advance Directive AND Psychiatric Advance Directive, the patient was offered information on these advance directives.  Unknown     Patient Instructions: Please continue all medications until otherwise directed by physician.      Tobacco Cessation Discharge Plan:   Is the patient a smoker and needs referral  for smoking cessation? No  Patient referred to the following for smoking cessation with an appointment? No   Patient was offered medication to assist with smoking cessation at discharge? No  Was education for smoking cessation added to the discharge instructions? No     Alcohol/Substance Abuse Discharge Plan:   Does the patient have a history of substance/alcohol abuse and requires a referral for treatment? Yes  Patient referred to the following for substance/alcohol abuse treatment with an appointment? Yes  Patient was offered medication to assist with alcohol cessation at discharge? No  Was education for substance/alcohol abuse added to discharge instructions? Yes     Patient discharged to Home; provided to the patient/caregiver either in hard copy or electronically.  Continuing care paperwork was faxed to community mental health providers.

## 2019-11-21 NOTE — Behavioral Health Treatment Team (Signed)
1930 Patient at this start of the shift is observed laying down in bed. Refused to allow staff to take his vital signs. Patient has been labile.  Around 2020  Patient is observed watching TV in the day room. Appears less irritable at this time. Denies SI/HI/AVH. Denies anxiety and depression. Reports that he is discharging tomorrow and is looking forward to that. Patient is med compliant. Requested PRN Vistaril for some mild anxiety.

## 2019-11-21 NOTE — Progress Notes (Signed)
Problem: Suicide  Goal: *STG: Remains safe in hospital  Outcome: Progressing Towards Goal

## 2019-11-21 NOTE — Behavioral Health Treatment Team (Signed)
 Behavioral Health Transition Record to Provider    Patient Name: Julian [name]  Date of Birth: 03-04-1961  Medical Record Number: [id number]  Date of Admission: 11/15/2019  Date of Discharge: 11/21/2019    Attending Provider: Debbie Prince CROME, *  Discharging Provider: Prince Debbie, MD  To contact this individual call [phone] and ask the operator to page.  If unavailable, ask to be transferred to [facility] Provider on call.  A Behavioral Health Provider will be available on call 24/7 and during holidays.    Primary Care Provider: Other, Phys, MD    Allergies   Allergen Reactions   . Ace Inhibitors Cough       Reason for Admission: MDD    Admission Diagnosis: Major depressive disorder [F32.9]    * No surgery found *    Results for orders placed or performed during the hospital encounter of 11/15/19   DRUG SCREEN, URINE   Result Value Ref Range    AMPHETAMINES Negative NEG      BARBITURATES Negative NEG      BENZODIAZEPINES Negative NEG      COCAINE Negative NEG      METHADONE Negative NEG      OPIATES Negative NEG      PCP(PHENCYCLIDINE) Negative NEG      THC (TH-CANNABINOL) Negative NEG      Drug screen comment (NOTE)    URINALYSIS W/ REFLEX CULTURE    Specimen: Urine   Result Value Ref Range    Color YELLOW/STRAW      Appearance CLEAR CLEAR      Specific gravity 1.010 1.003 - 1.030      pH (UA) 7.0 5.0 - 8.0      Protein Negative NEG mg/dL    Glucose Negative NEG mg/dL    Ketone Negative NEG mg/dL    Bilirubin Negative NEG      Blood Negative NEG      Urobilinogen 1.0 0.2 - 1.0 EU/dL    Nitrites Negative NEG      Leukocyte Esterase Negative NEG      WBC 0-4 0 - 4 /hpf    RBC 0-5 0 - 5 /hpf    Epithelial cells FEW FEW /lpf    Bacteria Negative NEG /hpf    UA:UC IF INDICATED CULTURE NOT INDICATED BY UA RESULT CNI     ETHYL ALCOHOL   Result Value Ref Range    ALCOHOL(ETHYL),SERUM <10 <10 MG/DL   CBC WITH AUTOMATED DIFF   Result Value Ref Range    WBC 7.2 4.1 - 11.1 K/uL    RBC 4.37 4.10 - 5.70 M/uL     HGB 12.5 12.1 - 17.0 g/dL    HCT 62.9 63.3 - 49.6 %    MCV 84.7 80.0 - 99.0 FL    MCH 28.6 26.0 - 34.0 PG    MCHC 33.8 30.0 - 36.5 g/dL    RDW 86.6 88.4 - 85.4 %    PLATELET 248 150 - 400 K/uL    MPV 10.3 8.9 - 12.9 FL    NRBC 0.0 0 PER 100 WBC    ABSOLUTE NRBC 0.00 0.00 - 0.01 K/uL    NEUTROPHILS 55 32 - 75 %    LYMPHOCYTES 29 12 - 49 %    MONOCYTES 9 5 - 13 %    EOSINOPHILS 5 0 - 7 %    BASOPHILS 1 0 - 1 %    IMMATURE GRANULOCYTES 1 (H) 0.0 - 0.5 %  ABS. NEUTROPHILS 4.0 1.8 - 8.0 K/UL    ABS. LYMPHOCYTES 2.1 0.8 - 3.5 K/UL    ABS. MONOCYTES 0.7 0.0 - 1.0 K/UL    ABS. EOSINOPHILS 0.3 0.0 - 0.4 K/UL    ABS. BASOPHILS 0.1 0.0 - 0.1 K/UL    ABS. IMM. GRANS. 0.0 0.00 - 0.04 K/UL    DF AUTOMATED     METABOLIC PANEL, COMPREHENSIVE   Result Value Ref Range    Sodium 141 136 - 145 mmol/L    Potassium 4.3 3.5 - 5.1 mmol/L    Chloride 103 97 - 108 mmol/L    CO2 29 21 - 32 mmol/L    Anion gap 9 5 - 15 mmol/L    Glucose 124 (H) 65 - 100 mg/dL    BUN 16 6 - 20 MG/DL    Creatinine 8.67 (H) 0.70 - 1.30 MG/DL    BUN/Creatinine ratio 12 12 - 20      GFR est AA >60 >60 ml/min/1.15m2    GFR est non-AA 56 (L) >60 ml/min/1.29m2    Calcium 9.0 8.5 - 10.1 MG/DL    Bilirubin, total 0.3 0.2 - 1.0 MG/DL    ALT (SGPT) 73 12 - 78 U/L    AST (SGOT) 50 (H) 15 - 37 U/L    Alk. phosphatase 71 45 - 117 U/L    Protein, total 7.2 6.4 - 8.2 g/dL    Albumin 3.5 3.5 - 5.0 g/dL    Globulin 3.7 2.0 - 4.0 g/dL    A-G Ratio 0.9 (L) 1.1 - 2.2     SARS-COV-2   Result Value Ref Range    SARS-CoV-2 Please find results under separate order     COVID-19 WITH INFLUENZA A/B   Result Value Ref Range    SARS-CoV-2 Not detected NOTD      Influenza A by PCR Not detected NOTD      Influenza B by PCR Not detected NOTD         Immunizations administered during this encounter:   There is no immunization history on file for this patient.    Screening for Metabolic Disorders for Patients on Antipsychotic Medications  (Data obtained from the EMR)    Estimated Body Mass  Index  Estimated body mass index is 31.32 kg/m as calculated from the following:    Height as of this encounter: 5' 7 (1.702 m).    Weight as of this encounter: 90.7 kg (200 lb).     Vital Signs/Blood Pressure  Visit Vitals  BP (!) 144/82 (BP 1 Location: Left arm, BP Patient Position: Sitting)   Pulse 99   Temp 98.3 F (36.8 C)   Resp 16   Ht 5' 7 (1.702 m)   Wt 90.7 kg (200 lb)   SpO2 96%   BMI 31.32 kg/m       Blood Glucose/Hemoglobin A1c  Lab Results   Component Value Date/Time    Glucose 124 (H) 11/15/2019 02:14 AM       No results found for: HBA1C, HGBE8, HBA1CEXT     Lipid Panel  No results found for: CHOL, CHOLX, CHLST, CHOLV, 884269, HDL, HDLP, LDL, LDLC, DLDLP, TGLX, TRIGL, TRIGP, CHHD, CHHDX     Discharge Diagnosis: Please refer to physician's discharge summary.     Discharge Plan:   The patient Julian [name] exhibits the ability to control behavior in a less restrictive environment.  Patient's level of functioning is improving.  No assaultive/destructive behavior has been observed for  the past 24 hours.  No suicidal/homicidal threat or behavior has been observed for the past 24 hours.  There is no evidence of serious medication side effects.  Patient has not been in physical or protective restraints for at least the past 24 hours.      Discharge Medication List and Instructions:   Current Discharge Medication List      CONTINUE these medications which have CHANGED    Details   losartan-hydroCHLOROthiazide (HYZAAR) 100-25 mg per tablet Take 1 Tab by mouth daily. Indications: high blood pressure, Patient reports last dose x 2 weeks ago  Qty: 30 Tab, Refills: 1      atorvastatin (LIPITOR) 40 mg tablet Take 1 Tab by mouth nightly. Indications: excessive fat in the blood  Qty: 30 Tab, Refills: 1      buPROPion XL (WELLBUTRIN XL) 150 mg tablet Take 1 Tab by mouth nightly. Indications: anxiousness associated with depression  Qty: 30 Tab, Refills: 1      hydrALAZINE (APRESOLINE) 50 mg tablet Take 1 Tab by  mouth two (2) times a day. Indications: high blood pressure  Qty: 30 Tab, Refills: 1      hydrOXYzine HCL (ATARAX) 50 mg tablet Take 1 Tab by mouth two (2) times daily as needed for Anxiety for up to 60 days. Indications: anxious  Qty: 60 Tab, Refills: 1      pregabalin (LYRICA) 100 mg capsule Take 1 Cap by mouth daily as needed (Neuropathic pain). Indications: Neuropathy  Qty: 30 Cap, Refills: 1    Associated Diagnoses: Neuropathy         CONTINUE these medications which have NOT CHANGED    Details   naproxen (NAPROSYN) 500 mg tablet Take 500 mg by mouth two (2) times daily as needed for Pain. Indications: pain      polyethylene glycol (Miralax) 17 gram/dose powder Take 17 g by mouth daily. Indications: constipation      hydrocortisone (CORTAID) 1 % topical cream Apply  to affected area two (2) times daily as needed for Skin Irritation. use thin layer   Indications: inflammation of the skin due to an allergy      lidocaine (XYLOCAINE) 4 % topical cream Apply  to affected area two (2) times daily as needed for Pain. Indications: skin irritation      green tea leaf extract (GREEN TEA PO) Take 500 mg by mouth. Indications: herbal supplement      B-complex with vitamin C (VITAMIN B COMPLEX-C PO) Take 1 Tab by mouth. Indications: vitamin B deficiency      eucalypt/men/camph/turp/wh.pet (MEDICATED CHEST RUB EX) by Apply Externally route as needed for Cough.      diphenhydrAMINE-zinc acetate 2%-0.1% (BENADRYL) 2-0.1 % topical cream Apply  to affected area two (2) times daily as needed for Itching.      guaiFENesin (ORGANIDIN) 400 mg tablet Take  by mouth every four (4) hours as needed for Congestion.      ferrous sulfate 325 mg (65 mg iron) tablet Take  by mouth Daily (before breakfast). Indications: anemia from inadequate iron      ginkgo biloba 120 mg tab Take 1 Tab by mouth. Indications: herbal supplement      methyl salicylate/menthol (BEN GAY EX) by Apply Externally route as needed.      acetaminophen (TYLENOL) 500  mg tablet Take 1 Tab by mouth every six (6) hours as needed for Pain.  Qty: 60 Tab, Refills: 0         STOP taking  these medications       Panax ginseng root (GINSENG KOREAN PO) Comments:   Reason for Stopping:         miconazole nitrate 2 % aerp Comments:   Reason for Stopping:         trolamine salicylate (Arthricream) 10 % topical cream Comments:   Reason for Stopping:         tolnaftate (TINACTIN) 1 % topical cream Comments:   Reason for Stopping:               Unresulted Labs (24h ago, onward)    None        To obtain results of studies pending at discharge, please contact N/A.    Follow-up Information     Follow up With Specialties Details Why Contact Info    [facility]   Go today This is the crisis stabilization program you will be stepping down to upon discharge.  [address]  [place], [place] [address]  [phone]    [facility]   Call today Please call Rapid Access phone line [phone] between 8:00AM -2:00PM to complete intake assessment for ongoing mental health case management, therapy and medication management. Please inquire about medical services through the [facility] clinic. Address: [address] [place], [place] [address]  Phone: [phone]  Fax: [phone]  Crisis number:  [phone]       Other, Phys, MD    Patient can only remember the practice name and not the physician            Advanced Directive:   Does the patient have an appointed surrogate decision maker? Unknown   Does the patient have a Medical Advance Directive? Unknown   Does the patient have a Psychiatric Advance Directive? Unknown   If the patient does not have a surrogate or Medical Advance Directive AND Psychiatric Advance Directive, the patient was offered information on these advance directives.  Unknown     Patient Instructions: Please continue all medications until otherwise directed by physician.      Tobacco Cessation Discharge Plan:   Is the patient a smoker and needs referral  for smoking cessation? No  Patient referred to the following for smoking cessation with an appointment? No   Patient was offered medication to assist with smoking cessation at discharge? No  Was education for smoking cessation added to the discharge instructions? No     Alcohol/Substance Abuse Discharge Plan:   Does the patient have a history of substance/alcohol abuse and requires a referral for treatment? Yes  Patient referred to the following for substance/alcohol abuse treatment with an appointment? Yes  Patient was offered medication to assist with alcohol cessation at discharge? No  Was education for substance/alcohol abuse added to discharge instructions? Yes     Patient discharged to Home; provided to the patient/caregiver either in hard copy or electronically.  Continuing care paperwork was faxed to community mental health providers.

## 2019-11-21 NOTE — Progress Notes (Signed)
Discharge Note:  Pt discharged from 3rd floor 10:10a    Pt denies SI/HI/A/VH.  Pt medication/diet compliant.      Pt is anxious/nervous about discharge.    RN reviewed discharge paperwork/prescriptions w/pt.  Pt understood and signed all discharge paperwork, rec'd all valuables and signed off.

## 2019-11-21 NOTE — Behavioral Health Treatment Team (Signed)
1930 Patient at this start of the shift is observed laying down in bed. Refused to allow staff to take his vital signs. Patient has been labile.  Around 2020  Patient is observed watching TV in the day room. Appears less irritable at this time. Denies SI/HI/AVH. Denies anxiety and depression. Reports that he is discharging tomorrow and is looking forward to that. Patient is med compliant. Requested PRN Vistaril for some mild anxiety.

## 2019-11-21 NOTE — Progress Notes (Signed)
 Problem: Suicide  Goal: *STG: Remains safe in hospital  Outcome: Progressing Towards Goal

## 2019-11-21 NOTE — Behavioral Health Treatment Team (Signed)
SAFETY PLAN    A suicide Safety Plan is a document that supports someone when they are having thoughts of suicide.    Warning Signs that indicate a suicidal crisis may be developing: What (situations, thoughts, feelings, body sensations, behaviors, etc.) do you experience that lets you know you are beginning to think about suicide?  1.  Depression   2.  tearfulness   3. Thoughts of hurting self    Internal Coping Strategies:  What things can I do (relaxation techniques, hobbies, physical activities, etc.) to take my mind off my problems without contacting another person?  1. humor  2. outdoors  3. Talking to others      Professionals or Behavioral Health agencies I can contact during a crisis: Who can I call for help - for example, my doctor, my psychiatrist, my psychologist, a mental health provider, a suicide hotline?    [facility]   Address: [address] [place], [place] [address]  Phone: [phone]  Fax: [phone]  Crisis number:  [phone]   3. Suicide Prevention Lifeline: 1-800-273-TALK (8255)    4. Local Behavioral Health Emergency Services -  for example, Community [facility], local county suicide hotline, [address] Hotline: [facility]   Address: [address] [place], [place] [address]  Phone: [phone]  Fax: [phone]  Crisis number:  [phone]   Making the environment safe: How can I make my environment (house/apartment/living space) safer? For example, can I remove guns, medications, and other items?  1. Take medications as prescribed.

## 2019-11-21 NOTE — Discharge Summary (Signed)
PSYCHIATRIC DISCHARGE SUMMARY         IDENTIFICATION:    Patient Name  Julian [name]   Date of Birth 04-Apr-1961   CSN [id number]   Medical Record Number  [id number]      Age  59 y.o.   PCP Other, Phys, MD   Admit date:  11/15/2019    Discharge date: 11/21/2019   Room Number  325/01  @ [facility]   Date of Service  11/21/2019            TYPE OF DISCHARGE: REGULAR               CONDITION AT DISCHARGE: fair and stable       PROVISIONAL & DISCHARGE DIAGNOSES:    Problem List  Never Reviewed          Codes Class    * (Principal) MDD (major depressive disorder), recurrent episode, moderate ([place]) ICD-10-CM: F33.1  ICD-9-CM: 296.32               Active Hospital Problems    *MDD (major depressive disorder), recurrent episode, moderate ([facility])        DISCHARGE DIAGNOSIS:   Axis I:  SEE ABOVE  Axis II: SEE ABOVE  Axis III: SEE ABOVE  Axis IV:  lack of structure  Axis V:  20 on admission, 55 on discharge     CC & HISTORY OF PRESENT ILLNESS:  The patient,??Julian [name], is a??59 y.o.????BLACK/AFRICAN AMERICAN??male??with a past psychiatric history significant for MDD, who presents at this time with complaints of (and/or evidence of) the following emotional symptoms:??depression and suicidal thoughts/threats. ??Additional symptomatology include family stressors. ??The above symptoms have been present for 2+ days. These symptoms are of moderate to high severity. These symptoms are??intermittent/ fleeting in nature. ??The patient's condition has been precipitated by psychosocial stressors. ??Patient's condition made worse by treatment noncompliance. UDS: negative; BAL=0.  ??  Per admission documentation, patient reported a desire to run into traffic leading to the current presentation he states that a family member urged him to come in. He reports conditional suicidality and denies prior hospitalizations.  ??  The patient is an unreliable??historian. The patient corroborates the above narrative. The patient contracts for safety on the  unit and gives consent for the team to contact collateral. The patient is amenable to initiating treatment while on the unit.??The patient states that he stopped taking his Wellbutrin due to frustration with his family, he is evasive about the circumstances preceding this situation. The patient reports he lives alone but per SW assessment has been staying on couches.  ??  4/04 - the patient slept 7.5 hours, remains irritable but calm and cooperative. Patient had a disagreement with his roommate, and was moved to a different room where he is doing better. Patient denies active thoughts of self harm, but endorses passive SI and anxiety; depression persists. Patient medication compliant, able to make needs known.??  ??  4/05 - no acute overnight events. Patient slept 6.5 hours, has been irritable with staff, endorsing ongoing conditional suicidality - he states that if discharged he will attempt to hurt himself. Patient reports anxiety and depression but denies active thoughts of self harm. Patient medication compliant, no PRNs given for agitation x24 hours. Patient smiling, cooperative with peers and in no apparent distress. He denies making conditionally suicidal statements.  ??  4/06 - patient slept 7 hours overnight, he continues to endorse passive SI but is calm and pleasant, interacting appropriately with  staff, attending groups. Patient irritable with nursing staff, reports ongoing anxiety but contracts for safety. SW working to ascertain his disposition options, as there is disagreement in the charts are to what his current housing situation is. Patient denies SI/HI/AVH/PI.  ??  4/07 - no acute overnight events. Patient visible, pleasant, cooperative with treatment. He continues to endorse SI, passive and intermittent, but acknowledges on interview that he has been transient and is hoping to have more help with housing. Patient given 48 hours notice, plan to discharge to shelter Friday. Patient given housing  resources, voices understanding.  ??  4/08 - patient slept 7 hours overnight, he remains irritable but cooperative with treatment. Patient still endorsing some passive SI but denies active thoughts of self harm. Patient for CSU placement tomorrow AM. SW coordinating disposition. He denies HI/AVH/PI.     SOCIAL HISTORY:    Social History     Socioeconomic History   ??? Marital status: SINGLE     Spouse name: Not on file   ??? Number of children: Not on file   ??? Years of education: Not on file   ??? Highest education level: Not on file   Occupational History   ??? Not on file   Social Needs   ??? Financial resource strain: Not on file   ??? Food insecurity     Worry: Not on file     Inability: Not on file   ??? Transportation needs     Medical: Not on file     Non-medical: Not on file   Tobacco Use   ??? Smoking status: Never Smoker   ??? Smokeless tobacco: Never Used   Substance and Sexual Activity   ??? Alcohol use: No     Frequency: Never   ??? Drug use: No   ??? Sexual activity: Not Currently   Lifestyle   ??? Physical activity     Days per week: Not on file     Minutes per session: Not on file   ??? Stress: Not on file   Relationships   ??? Social Product manager on phone: Not on file     Gets together: Not on file     Attends religious service: Not on file     Active member of club or organization: Not on file     Attends meetings of clubs or organizations: Not on file     Relationship status: Not on file   ??? Intimate partner violence     Fear of current or ex partner: Not on file     Emotionally abused: Not on file     Physically abused: Not on file     Forced sexual activity: Not on file   Other Topics Concern   ??? Not on file   Social History Narrative   ??? Not on file      FAMILY HISTORY:   History reviewed. No pertinent family history.          HOSPITALIZATION COURSE:    GUSTAVO DISPENZA was admitted to the inpatient psychiatric unit [facility] for acute psychiatric stabilization in regards to symptomatology as  described in the HPI above. The differential diagnosis at time of admission included: MDD vs adjustment disorder.  While on the unit ESAI STECKLEIN was involved in individual, group, occupational and milieu therapy.  Psychiatric medications were adjusted during this hospitalization including Wellbutrin.   [place] demonstrated a slow, but progressive improvement in overall condition.  Much of patient's initial presentation appeared to be related to situational stressors, effects of medication non-compliance and psychological factors.  Please see individual progress notes for more specific details regarding patient's hospitalization course.     Patient with request for discharge today. There are no grounds to seek a TDO.    At time of discharge, SATORU MILICH is without significant problems of depression, psychosis, or mania. Patient free of suicidal and homicidal ideations (appears to be at very low risk of suicide or homicide) and reports many positive predictive factors in terms of not attempting suicide or homicide. Overall presentation at time of discharge is most consistent with the diagnosis of MDD, recurrent moderate.    Patient has maximized benefit to be derived from acute inpatient psychiatric treatment.  All members of the treatment team concur with each other in regards to plans for discharge today. Patient aware and in agreement with discharge and discharge plan.         LABS AND IMAGAING:    Labs Reviewed   CBC WITH AUTOMATED DIFF - Abnormal; Notable for the following components:       Result Value    IMMATURE GRANULOCYTES 1 (*)     All other components within normal limits   METABOLIC PANEL, COMPREHENSIVE - Abnormal; Notable for the following components:    Glucose 124 (*)     Creatinine 1.32 (*)     GFR est non-AA 56 (*)     AST (SGOT) 50 (*)     A-G Ratio 0.9 (*)     All other components within normal limits   DRUG SCREEN, URINE   URINALYSIS W/ REFLEX CULTURE   ETHYL ALCOHOL   SARS-COV-2    COVID-19 WITH INFLUENZA A/B     No results found for: DS35, PHEN, PHENO, PHENT, DILF, DS39, PHENY, PTN, VALF2, VALAC, VALP, VALPR, DS6, CRBAM, CRBAMP, CARB2, XCRBAM  Admission on 11/15/2019, Discharged on 11/21/2019   Component Date Value Ref Range Status   ??? AMPHETAMINES 11/15/2019 Negative  NEG   Final   ??? BARBITURATES 11/15/2019 Negative  NEG   Final   ??? BENZODIAZEPINES 11/15/2019 Negative  NEG   Final   ??? COCAINE 11/15/2019 Negative  NEG   Final   ??? METHADONE 11/15/2019 Negative  NEG   Final   ??? OPIATES 11/15/2019 Negative  NEG   Final   ??? PCP(PHENCYCLIDINE) 11/15/2019 Negative  NEG   Final   ??? THC (TH-CANNABINOL) 11/15/2019 Negative  NEG   Final   ??? Drug screen comment 11/15/2019 (NOTE)   Final   ??? Color 11/15/2019 YELLOW/STRAW    Final   ??? Appearance 11/15/2019 CLEAR  CLEAR   Final   ??? Specific gravity 11/15/2019 1.010  1.003 - 1.030   Final   ??? pH (UA) 11/15/2019 7.0  5.0 - 8.0   Final   ??? Protein 11/15/2019 Negative  NEG mg/dL Final   ??? Glucose 11/15/2019 Negative  NEG mg/dL Final   ??? Ketone 11/15/2019 Negative  NEG mg/dL Final   ??? Bilirubin 11/15/2019 Negative  NEG   Final   ??? Blood 11/15/2019 Negative  NEG   Final   ??? Urobilinogen 11/15/2019 1.0  0.2 - 1.0 EU/dL Final   ??? Nitrites 11/15/2019 Negative  NEG   Final   ??? Leukocyte Esterase 11/15/2019 Negative  NEG   Final   ??? WBC 11/15/2019 0-4  0 - 4 /hpf Final   ??? RBC 11/15/2019 0-5  0 - 5 /hpf Final   ???  Epithelial cells 11/15/2019 FEW  FEW /lpf Final   ??? Bacteria 11/15/2019 Negative  NEG /hpf Final   ??? UA:UC IF INDICATED 11/15/2019 CULTURE NOT INDICATED BY UA RESULT  CNI   Final   ??? ALCOHOL(ETHYL),SERUM 11/15/2019 <10  <10 MG/DL Final   ??? WBC 11/15/2019 7.2  4.1 - 11.1 K/uL Final   ??? RBC 11/15/2019 4.37  4.10 - 5.70 M/uL Final   ??? HGB 11/15/2019 12.5  12.1 - 17.0 g/dL Final   ??? HCT 11/15/2019 37.0  36.6 - 50.3 % Final   ??? MCV 11/15/2019 84.7  80.0 - 99.0 FL Final   ??? MCH 11/15/2019 28.6  26.0 - 34.0 PG Final   ??? MCHC 11/15/2019 33.8  30.0 - 36.5 g/dL  Final   ??? RDW 11/15/2019 13.3  11.5 - 14.5 % Final   ??? PLATELET 11/15/2019 248  150 - 400 K/uL Final   ??? MPV 11/15/2019 10.3  8.9 - 12.9 FL Final   ??? NRBC 11/15/2019 0.0  0 PER 100 WBC Final   ??? ABSOLUTE NRBC 11/15/2019 0.00  0.00 - 0.01 K/uL Final   ??? NEUTROPHILS 11/15/2019 55  32 - 75 % Final   ??? LYMPHOCYTES 11/15/2019 29  12 - 49 % Final   ??? MONOCYTES 11/15/2019 9  5 - 13 % Final   ??? EOSINOPHILS 11/15/2019 5  0 - 7 % Final   ??? BASOPHILS 11/15/2019 1  0 - 1 % Final   ??? IMMATURE GRANULOCYTES 11/15/2019 1* 0.0 - 0.5 % Final   ??? ABS. NEUTROPHILS 11/15/2019 4.0  1.8 - 8.0 K/UL Final   ??? ABS. LYMPHOCYTES 11/15/2019 2.1  0.8 - 3.5 K/UL Final   ??? ABS. MONOCYTES 11/15/2019 0.7  0.0 - 1.0 K/UL Final   ??? ABS. EOSINOPHILS 11/15/2019 0.3  0.0 - 0.4 K/UL Final   ??? ABS. BASOPHILS 11/15/2019 0.1  0.0 - 0.1 K/UL Final   ??? ABS. IMM. GRANS. 11/15/2019 0.0  0.00 - 0.04 K/UL Final   ??? DF 11/15/2019 AUTOMATED    Final   ??? Sodium 11/15/2019 141  136 - 145 mmol/L Final   ??? Potassium 11/15/2019 4.3  3.5 - 5.1 mmol/L Final   ??? Chloride 11/15/2019 103  97 - 108 mmol/L Final   ??? CO2 11/15/2019 29  21 - 32 mmol/L Final   ??? Anion gap 11/15/2019 9  5 - 15 mmol/L Final   ??? Glucose 11/15/2019 124* 65 - 100 mg/dL Final   ??? BUN 11/15/2019 16  6 - 20 MG/DL Final   ??? Creatinine 11/15/2019 1.32* 0.70 - 1.30 MG/DL Final   ??? BUN/Creatinine ratio 11/15/2019 12  12 - 20   Final   ??? GFR est AA 11/15/2019 >60  >60 ml/min/1.53m Final   ??? GFR est non-AA 11/15/2019 56* >60 ml/min/1.76mFinal   ??? Calcium 11/15/2019 9.0  8.5 - 10.1 MG/DL Final   ??? Bilirubin, total 11/15/2019 0.3  0.2 - 1.0 MG/DL Final   ??? ALT (SGPT) 11/15/2019 73  12 - 78 U/L Final   ??? AST (SGOT) 11/15/2019 50* 15 - 37 U/L Final   ??? Alk. phosphatase 11/15/2019 71  45 - 117 U/L Final   ??? Protein, total 11/15/2019 7.2  6.4 - 8.2 g/dL Final   ??? Albumin 11/15/2019 3.5  3.5 - 5.0 g/dL Final   ??? Globulin 11/15/2019 3.7  2.0 - 4.0 g/dL Final   ??? A-G Ratio 11/15/2019 0.9* 1.1 - 2.2   Final   ???  SARS-CoV-2 11/15/2019 Please find results under separate order    Final   ??? SARS-CoV-2 11/15/2019 Not detected  NOTD   Final   ??? Influenza A by PCR 11/15/2019 Not detected  NOTD   Final   ??? Influenza B by PCR 11/15/2019 Not detected  NOTD   Final     No results found.                DISPOSITION:    Home. Patient to f/u with psychiatric and psychotherapy appointments. Patient is to f/u with internist as directed.                FOLLOW-UP CARE:    Activity as tolerated  Regular diet  Wound Care: none needed.  Follow-up Information     Follow up With Specialties Details Why Contact Info    [place] today This is the crisis stabilization program you will be stepping down to upon discharge.  [address] [place] [address]  [place]   Call today Please call [facility] phone line [phone] between 8:00AM -2:00PM to complete intake assessment for ongoing mental health case management, therapy and medication management. Please inquire about medical services through the [facility] clinic. Address: [address] [place] [address]  Phone: [phone]  Fax: [phone]  Crisis number:  [phone]       Other, Phys, MD    Patient can only remember the practice name and not the physician                   PROGNOSIS:   Fair ---- based on nature of patient's pathology/ies and treatment compliance issues.  Prognosis is greatly dependent upon patient's ability to remain sober and to follow up with scheduled appointments as well as to comply with psychiatric medications as prescribed.            DISCHARGE MEDICATIONS:     Informed consent given for the use of following psychotropic medications:  Discharge Medication List as of 11/21/2019  9:56 AM      CONTINUE these medications which have CHANGED    Details   losartan-hydroCHLOROthiazide (HYZAAR) 100-25 mg per tablet Take 1 Tab by mouth daily. Indications: high blood pressure, Patient reports last dose  x 2 weeks ago, Print, Disp-30 Tab, R-1      atorvastatin (LIPITOR) 40 mg tablet Take 1 Tab by mouth nightly. Indications: excessive fat in the blood, Print, Disp-30 Tab, R-1      buPROPion XL (WELLBUTRIN XL) 150 mg tablet Take 1 Tab by mouth nightly. Indications: anxiousness associated with depression, Print, Disp-30 Tab, R-1      hydrALAZINE (APRESOLINE) 50 mg tablet Take 1 Tab by mouth two (2) times a day. Indications: high blood pressure, Print, Disp-30 Tab, R-1      hydrOXYzine HCL (ATARAX) 50 mg tablet Take 1 Tab by mouth two (2) times daily as needed for Anxiety for up to 60 days. Indications: anxious, Print, Disp-60 Tab, R-1      pregabalin (LYRICA) 100 mg capsule Take 1 Cap by mouth daily as needed (Neuropathic pain). Indications: Neuropathy, Print, Disp-30 Cap, R-1         CONTINUE these medications which have NOT CHANGED    Details   naproxen (NAPROSYN) 500 mg tablet Take 500 mg by mouth two (2) times daily as needed for Pain. Indications: pain, Historical Med  polyethylene glycol (Miralax) 17 gram/dose powder Take 17 g by mouth daily. Indications: constipation, Historical Med      hydrocortisone (CORTAID) 1 % topical cream Apply  to affected area two (2) times daily as needed for Skin Irritation. use thin layer   Indications: inflammation of the skin due to an allergy, Historical Med      lidocaine (XYLOCAINE) 4 % topical cream Apply  to affected area two (2) times daily as needed for Pain. Indications: skin irritation, Historical Med      green tea leaf extract (GREEN TEA PO) Take 500 mg by mouth. Indications: herbal supplement, Historical Med      B-complex with vitamin C (VITAMIN B COMPLEX-C PO) Take 1 Tab by mouth. Indications: vitamin B deficiency, Historical Med      eucalypt/men/camph/turp/wh.pet (MEDICATED CHEST RUB EX) by Apply Externally route as needed for Cough., Historical Med      diphenhydrAMINE-zinc acetate 2%-0.1% (BENADRYL) 2-0.1 % topical cream Apply  to affected area two (2) times  daily as needed for Itching., Historical Med      guaiFENesin (ORGANIDIN) 400 mg tablet Take  by mouth every four (4) hours as needed for Congestion., Historical Med      ferrous sulfate 325 mg (65 mg iron) tablet Take  by mouth Daily (before breakfast). Indications: anemia from inadequate iron, Historical Med      ginkgo biloba 120 mg tab Take 1 Tab by mouth. Indications: herbal supplement, Historical Med      methyl salicylate/menthol (BEN GAY EX) by Apply Externally route as needed., Historical Med      acetaminophen (TYLENOL) 500 mg tablet Take 1 Tab by mouth every six (6) hours as needed for Pain., Print, Disp-60 Tab, R-0         STOP taking these medications       Panax ginseng root (GINSENG KOREAN PO) Comments:   Reason for Stopping:         miconazole nitrate 2 % aerp Comments:   Reason for Stopping:         trolamine salicylate (Arthricream) 10 % topical cream Comments:   Reason for Stopping:         tolnaftate (TINACTIN) 1 % topical cream Comments:   Reason for Stopping:                      A coordinated, multidisplinary treatment team round was conducted with Audry Pili D Gersten---this is done daily here at [facility]. This team consists of the nurse, psychiatric unit pharmacist, Catering manager.     I have spent greater than 35 minutes on discharge work.    Signed:  Raeford Razor, MD  11/21/2019

## 2019-11-21 NOTE — Behavioral Health Treatment Team (Signed)
0700- Pt's nurse Babs Sciara) took report from night shift nurse Lowanda Foster).   0730- Pt is alert/oriented x4. Calm and Cooperative. Appropriate in the milieu. Mood is good. No PRNs given this shift. No behavioral issues. Will continue to monitor pt with q15 min rounds for safety.   0830- Pt was in bathroom having a bowel movement.  0900- Pt received medications an vital signs. Vitals WDL.

## 2019-11-21 NOTE — Progress Notes (Signed)
 Suggest CC diet for pt given his history is notable for DM, HTN and his BMI is elevated.  Otherwise diet as tolerated. Enc fluid intake.  Pt meal compliant.  Hx noted above.  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

## 2020-01-14 DIAGNOSIS — Z765 Malingerer [conscious simulation]: Secondary | ICD-10-CM | POA: Insufficient documentation

## 2020-09-06 DIAGNOSIS — F411 Generalized anxiety disorder: Secondary | ICD-10-CM

## 2020-09-06 NOTE — ED Notes (Signed)
Pt requesting prescription refill for wellbutrin and hydralazine. Has no other needs or complaints at this time.

## 2020-09-06 NOTE — ED Provider Notes (Signed)
EMERGENCY DEPARTMENT HISTORY AND PHYSICAL EXAM    9:44 PM    Date: 09/06/2020  Patient Name: Julian [name]    History of Presenting Illness     Chief Complaint   Patient presents with   ??? Medication Refill       History Provided By: Patient  Location/Duration/Severity/Modifying factors   HPI   Julian [name] is a 60 y.o. male with past medical history of anxiety and depression presentin with a primary complaint of medication refill.  Patient states that he has been out of his Atarax and Wellbutrin for the last 3 to 4 days and he feels like he is going to have a panic attack happened.  He was at [facility] when he called 911 for transport to the hospital to have his medications refilled.  He complains of baseline anxiety and depression that have worsened over the last few days and notes increased stressors that have caused this.  He denies any suicidal ideation, homicidal ideation, hallucinations.  He is not interested in a hospitalization, but would potentially like his medications changed.  Denies any medical illness or needs today.    PCP: Other, Phys, MD    Current Outpatient Medications   Medication Sig Dispense Refill   ??? losartan-hydroCHLOROthiazide (HYZAAR) 100-25 mg per tablet Take 1 Tab by mouth daily. Indications: high blood pressure, Patient reports last dose x 2 weeks ago 30 Tab 1   ??? atorvastatin (LIPITOR) 40 mg tablet Take 1 Tab by mouth nightly. Indications: excessive fat in the blood 30 Tab 1   ??? buPROPion XL (WELLBUTRIN XL) 150 mg tablet Take 1 Tab by mouth nightly. Indications: anxiousness associated with depression 30 Tab 1   ??? hydrALAZINE (APRESOLINE) 50 mg tablet Take 1 Tab by mouth two (2) times a day. Indications: high blood pressure 30 Tab 1   ??? pregabalin (LYRICA) 100 mg capsule Take 1 Cap by mouth daily as needed (Neuropathic pain). Indications: Neuropathy 30 Cap 1   ??? naproxen (NAPROSYN) 500 mg tablet Take 500 mg by mouth two (2) times daily as needed for Pain. Indications: pain     ???  polyethylene glycol (Miralax) 17 gram/dose powder Take 17 g by mouth daily. Indications: constipation     ??? hydrocortisone (CORTAID) 1 % topical cream Apply  to affected area two (2) times daily as needed for Skin Irritation. use thin layer   Indications: inflammation of the skin due to an allergy     ??? lidocaine (XYLOCAINE) 4 % topical cream Apply  to affected area two (2) times daily as needed for Pain. Indications: skin irritation     ??? green tea leaf extract (GREEN TEA PO) Take 500 mg by mouth. Indications: herbal supplement     ??? B-complex with vitamin C (VITAMIN B COMPLEX-C PO) Take 1 Tab by mouth. Indications: vitamin B deficiency     ??? eucalypt/men/camph/turp/wh.pet (MEDICATED CHEST RUB EX) by Apply Externally route as needed for Cough.     ??? diphenhydrAMINE-zinc acetate 2%-0.1% (BENADRYL) 2-0.1 % topical cream Apply  to affected area two (2) times daily as needed for Itching.     ??? guaiFENesin (ORGANIDIN) 400 mg tablet Take  by mouth every four (4) hours as needed for Congestion.     ??? ferrous sulfate 325 mg (65 mg iron) tablet Take  by mouth Daily (before breakfast). Indications: anemia from inadequate iron     ??? ginkgo biloba 120 mg tab Take 1 Tab by mouth. Indications: herbal supplement     ???  methyl salicylate/menthol (BEN GAY EX) by Apply Externally route as needed.     ??? acetaminophen (TYLENOL) 500 mg tablet Take 1 Tab by mouth every six (6) hours as needed for Pain. 60 Tab 0       Past History     Past Medical History:  Past Medical History:   Diagnosis Date   ??? Anxiety    ??? Chronic constipation    ??? Diabetes (HCC)    ??? Hypertension    ??? Major depression    ??? OCD (obsessive compulsive disorder)        Past Surgical History:  History reviewed. No pertinent surgical history.    Family History:  History reviewed. No pertinent family history.    Social History:  Social History     Tobacco Use   ??? Smoking status: Never Smoker   ??? Smokeless tobacco: Never Used   Substance Use Topics   ??? Alcohol use: No   ???  Drug use: No       Allergies:  Allergies   Allergen Reactions   ??? Ace Inhibitors Cough       I reviewed and confirmed the above information with patient and updated as necessary.    Review of Systems     Review of Systems   Constitutional: Negative for diaphoresis and fever.   HENT: Negative for ear pain and sore throat.    Eyes:        No acute change in vision   Respiratory: Negative for cough and shortness of breath.    Cardiovascular: Negative for chest pain and leg swelling.   Gastrointestinal: Negative for abdominal pain and vomiting.   Genitourinary: Negative for dysuria.   Musculoskeletal: Negative for neck pain.   Skin: Negative for wound.   Neurological: Negative for weakness and headaches.   Psychiatric/Behavioral: Negative for hallucinations, self-injury and suicidal ideas. The patient is nervous/anxious.      Physical Exam     Visit Vitals  BP (!) 144/89 (BP 1 Location: Right upper arm, BP Patient Position: At rest;Sitting)   Pulse (!) 102   Temp 98 ??F (36.7 ??C)   Resp 18   Ht 5\' 7"  (1.702 m)   Wt 89.4 kg (197 lb)   SpO2 100%   BMI 30.85 kg/m??       Physical Exam  Vitals and nursing note reviewed.   Constitutional:       Appearance: Normal appearance. He is not ill-appearing.      Comments: Adult male, resting in the hospital chair, nondistressed.   HENT:      Mouth/Throat:      Mouth: Mucous membranes are moist.   Eyes:      Pupils: Pupils are equal, round, and reactive to light.   Cardiovascular:      Rate and Rhythm: Normal rate and regular rhythm.      Pulses: Normal pulses.      Heart sounds: Normal heart sounds.   Pulmonary:      Effort: Pulmonary effort is normal.      Breath sounds: Normal breath sounds.   Abdominal:      General: Abdomen is flat.      Tenderness: There is no abdominal tenderness.   Musculoskeletal:         General: No swelling. Normal range of motion.      Cervical back: Normal range of motion.   Skin:     General: Skin is warm.   Neurological:  General: No focal deficit  present.      Mental Status: He is alert and oriented to person, place, and time.   Psychiatric:         Attention and Perception: Attention and perception normal.         Mood and Affect: Mood is anxious.         Speech: Speech normal.         Behavior: Behavior normal.         Thought Content: Thought content is not paranoid or delusional. Thought content does not include homicidal or suicidal ideation. Thought content does not include homicidal or suicidal plan.         Diagnostic Study Results     Labs -  No results found for this or any previous visit (from the past 24 hour(s)).      Radiologic Studies -   No orders to display           Medical Decision Making   I am the first provider for this patient.    I reviewed the vital signs, available nursing notes, past medical history, past surgical history, family history and social history.    Vital Signs-Reviewed the patient's vital signs.    Provider Notes (Medical Decision Making):   MDM  60 year old male here with anxiety depression requesting medication refill.  Not actively suicidal, homicidal.  No medical complaints    ED Course: Progress Notes, Reevaluation, and Consults:  Patient afebrile, hemodynamically normal  Resting comfortably    25-minute discussion with the patient going over the things that we are are not able to offer here in emergency department.  I have offered to draw labs and have him see social work in the morning for hospitalization but he declined.  Offered offered him a first dose of this is Atarax and Wellbutrin here and to refill them outpatient.  He would like his medication adjusted and does not feel like he should have to wait for this.  Explained to him that I am not an alcohol provider will not make adjustments to his mental health medications.  At this time he became frustrated and said that he wanted to go to a different hospital.  I explained to him that all emergency departments in the area function the same way and that he  needs to be referred to mental health provider for outpatient management.  He left the emergency department anyway.  Is seeing as he is not suicidal or homicidal, not a threat to himself and has a capacity to make this decision Police were not called           Procedures    Diagnosis     Clinical Impression:   1. Anxiety state    2. Eloped from emergency department        Disposition: Eloped    Follow-up Information    None          Discharge Medication List as of 09/06/2020  9:55 PM      CONTINUE these medications which have NOT CHANGED    Details   losartan-hydroCHLOROthiazide (HYZAAR) 100-25 mg per tablet Take 1 Tab by mouth daily. Indications: high blood pressure, Patient reports last dose x 2 weeks ago, Print, Disp-30 Tab, R-1      atorvastatin (LIPITOR) 40 mg tablet Take 1 Tab by mouth nightly. Indications: excessive fat in the blood, Print, Disp-30 Tab, R-1      buPROPion XL (WELLBUTRIN XL) 150 mg tablet  Take 1 Tab by mouth nightly. Indications: anxiousness associated with depression, Print, Disp-30 Tab, R-1      hydrALAZINE (APRESOLINE) 50 mg tablet Take 1 Tab by mouth two (2) times a day. Indications: high blood pressure, Print, Disp-30 Tab, R-1      pregabalin (LYRICA) 100 mg capsule Take 1 Cap by mouth daily as needed (Neuropathic pain). Indications: Neuropathy, Print, Disp-30 Cap, R-1      naproxen (NAPROSYN) 500 mg tablet Take 500 mg by mouth two (2) times daily as needed for Pain. Indications: pain, Historical Med      polyethylene glycol (Miralax) 17 gram/dose powder Take 17 g by mouth daily. Indications: constipation, Historical Med      hydrocortisone (CORTAID) 1 % topical cream Apply  to affected area two (2) times daily as needed for Skin Irritation. use thin layer   Indications: inflammation of the skin due to an allergy, Historical Med      lidocaine (XYLOCAINE) 4 % topical cream Apply  to affected area two (2) times daily as needed for Pain. Indications: skin irritation, Historical Med       green tea leaf extract (GREEN TEA PO) Take 500 mg by mouth. Indications: herbal supplement, Historical Med      B-complex with vitamin C (VITAMIN B COMPLEX-C PO) Take 1 Tab by mouth. Indications: vitamin B deficiency, Historical Med      eucalypt/men/camph/turp/wh.pet (MEDICATED CHEST RUB EX) by Apply Externally route as needed for Cough., Historical Med      diphenhydrAMINE-zinc acetate 2%-0.1% (BENADRYL) 2-0.1 % topical cream Apply  to affected area two (2) times daily as needed for Itching., Historical Med      guaiFENesin (ORGANIDIN) 400 mg tablet Take  by mouth every four (4) hours as needed for Congestion., Historical Med      ferrous sulfate 325 mg (65 mg iron) tablet Take  by mouth Daily (before breakfast). Indications: anemia from inadequate iron, Historical Med      ginkgo biloba 120 mg tab Take 1 Tab by mouth. Indications: herbal supplement, Historical Med      methyl salicylate/menthol (BEN GAY EX) by Apply Externally route as needed., Historical Med      acetaminophen (TYLENOL) 500 mg tablet Take 1 Tab by mouth every six (6) hours as needed for Pain., Print, Disp-60 Tab, R-0             Charline BillsBrian Allani Reber, MD   Emergency Medicine   September 06, 2020, 9:44 PM     This note is dictated utilizing Dragon voice recognition software. Unfortunately this leads to occasional typographical errors using the voice recognition. I apologize in advance if the situation occurs. If questions occur please do not hesitate to contact me directly.    Lynford HumphreyBrian C Daine Gunther, MD

## 2020-09-07 ENCOUNTER — Inpatient Hospital Stay: Admit: 2020-09-07 | Discharge: 2020-09-07 | Payer: MEDICARE | Attending: Emergency Medicine

## 2020-11-20 ENCOUNTER — Other Ambulatory Visit: Payer: Self-pay

## 2020-11-20 ENCOUNTER — Emergency Department (HOSPITAL_COMMUNITY)
Admission: EM | Admit: 2020-11-20 | Discharge: 2020-11-21 | Disposition: A | Discharge status: Home / Self Care | Payer: Medicare (Managed Care) | Attending: Emergency Medicine | Admitting: Emergency Medicine

## 2020-11-20 ENCOUNTER — Encounter (HOSPITAL_COMMUNITY): Payer: Self-pay

## 2020-11-20 DIAGNOSIS — F419 Anxiety disorder, unspecified: Secondary | ICD-10-CM | POA: Insufficient documentation

## 2020-11-20 DIAGNOSIS — E119 Type 2 diabetes mellitus without complications: Secondary | ICD-10-CM | POA: Insufficient documentation

## 2020-11-20 DIAGNOSIS — F332 Major depressive disorder, recurrent severe without psychotic features: Secondary | ICD-10-CM | POA: Insufficient documentation

## 2020-11-20 DIAGNOSIS — Z20822 Contact with and (suspected) exposure to covid-19: Secondary | ICD-10-CM | POA: Diagnosis not present

## 2020-11-20 DIAGNOSIS — I10 Essential (primary) hypertension: Secondary | ICD-10-CM | POA: Diagnosis not present

## 2020-11-20 DIAGNOSIS — F32A Depression, unspecified: Secondary | ICD-10-CM

## 2020-11-20 DIAGNOSIS — Z79899 Other long term (current) drug therapy: Secondary | ICD-10-CM | POA: Insufficient documentation

## 2020-11-20 LAB — ACETAMINOPHEN LEVEL: Acetaminophen (Tylenol), Serum: <10 ug/mL — ABNORMAL LOW (ref 10–30)

## 2020-11-20 LAB — CBC
HCT: 41.8 % (ref 39.0–52.0)
Hemoglobin: 13.9 g/dL (ref 13.0–17.0)
MCH: 28.8 pg (ref 26.0–34.0)
MCHC: 33.3 g/dL (ref 30.0–36.0)
MCV: 86.5 fL (ref 80.0–100.0)
Platelets: 282 10*3/uL (ref 150–400)
RBC: 4.83 MIL/uL (ref 4.22–5.81)
RDW: 13.4 % (ref 11.5–15.5)
WBC: 8 10*3/uL (ref 4.0–10.5)
nRBC: 0 % (ref 0.0–0.2)

## 2020-11-20 LAB — RAPID URINE DRUG SCREEN, HOSP PERFORMED
Amphetamines: NOT DETECTED
Barbiturates: NOT DETECTED
Benzodiazepines: NOT DETECTED
Cocaine: NOT DETECTED
Opiates: NOT DETECTED
Tetrahydrocannabinol: NOT DETECTED

## 2020-11-20 LAB — COMPREHENSIVE METABOLIC PANEL
ALT: 24 U/L (ref 0–44)
AST: 26 U/L (ref 15–41)
Albumin: 4.3 g/dL (ref 3.5–5.0)
Alkaline Phosphatase: 70 U/L (ref 38–126)
Anion gap: 9 (ref 5–15)
BUN: 10 mg/dL (ref 6–20)
CO2: 27 mmol/L (ref 22–32)
Calcium: 9.8 mg/dL (ref 8.9–10.3)
Chloride: 106 mmol/L (ref 98–111)
Creatinine, Ser: 1.07 mg/dL (ref 0.61–1.24)
GFR, Estimated: >60 mL/min (ref >60)
Glucose, Bld: 106 mg/dL — ABNORMAL HIGH (ref 70–99)
Potassium: 3.8 mmol/L (ref 3.5–5.1)
Sodium: 142 mmol/L (ref 135–145)
Total Bilirubin: 0.9 mg/dL (ref 0.3–1.2)
Total Protein: 7.6 g/dL (ref 6.5–8.1)

## 2020-11-20 LAB — ETHANOL: Alcohol, Ethyl (B): <10 mg/dL (ref <10)

## 2020-11-20 LAB — SALICYLATE LEVEL: Salicylate Lvl: <7 mg/dL — ABNORMAL LOW (ref 7.0–30.0)

## 2020-11-20 NOTE — ED Triage Notes (Signed)
Patient reports hx of depression and anxiety, states he has not taken his medication in a couple of days as he has been feeling hopeless.

## 2020-11-21 DIAGNOSIS — F419 Anxiety disorder, unspecified: Secondary | ICD-10-CM | POA: Diagnosis not present

## 2020-11-21 LAB — RESP PANEL BY RT-PCR (FLU A&B, COVID) ARPGX2
Influenza A by PCR: NEGATIVE
Influenza B by PCR: NEGATIVE
SARS Coronavirus 2 by RT PCR: NEGATIVE

## 2020-11-21 MED ORDER — GLIPIZIDE ER 10 MG PO TB24: 10.0000 mg | ORAL_TABLET | Freq: Every day | ORAL | Status: DC

## 2020-11-21 MED ORDER — HYDRALAZINE HCL 25 MG PO TABS: 75.0000 mg | ORAL_TABLET | Freq: Two times a day (BID) | ORAL | Status: DC

## 2020-11-21 MED ORDER — BUPROPION HCL ER (XL) 150 MG PO TB24: 150.0000 mg | ORAL_TABLET | Freq: Every day | ORAL | Status: DC

## 2020-11-21 MED ORDER — METOPROLOL TARTRATE 25 MG PO TABS: 150.0000 mg | ORAL_TABLET | Freq: Two times a day (BID) | ORAL | Status: DC

## 2020-11-21 MED ORDER — CITALOPRAM HYDROBROMIDE 10 MG PO TABS: 20.0000 mg | ORAL_TABLET | Freq: Every day | ORAL | Status: DC

## 2020-11-21 MED ORDER — ATORVASTATIN CALCIUM 40 MG PO TABS: 40.0000 mg | ORAL_TABLET | Freq: Every day | ORAL | Status: DC

## 2020-11-21 MED ORDER — AMLODIPINE BESYLATE 5 MG PO TABS
10.0000 mg | ORAL_TABLET | Freq: Every day | ORAL | Status: DC
  Administered 2020-11-21: 10 mg via ORAL
  Filled 2020-11-21: qty 2

## 2020-11-21 MED ORDER — PREGABALIN 25 MG PO CAPS: 75.0000 mg | ORAL_CAPSULE | Freq: Three times a day (TID) | ORAL | Status: DC

## 2020-11-21 MED ORDER — SENNOSIDES-DOCUSATE SODIUM 8.6-50 MG PO TABS: 1.0000 | ORAL_TABLET | Freq: Every evening | ORAL | Status: DC | PRN

## 2020-11-21 MED ORDER — METFORMIN HCL 500 MG PO TABS: 500.0000 mg | ORAL_TABLET | Freq: Two times a day (BID) | ORAL | Status: DC

## 2020-11-21 MED ORDER — HYDROXYZINE HCL 50 MG PO TABS: 50.0000 mg | ORAL_TABLET | Freq: Every day | ORAL | Status: DC

## 2020-11-21 NOTE — Discharge Instructions (Addendum)
1. Medications: Continue home medications including Wellbutrin and blood pressure medications, usual home medications 2. Treatment: rest, drink plenty of fluids, 3. Follow Up: Please followup with your primary doctor and one of the outpatient counselors in the next week.  Please return to the ER for new or worsening symptoms, suicidal ideation or other concerns.

## 2020-11-21 NOTE — ED Provider Notes (Signed)
[facility] EMERGENCY DEPARTMENT Provider Note   CSN: [id number] Arrival date & time: 11/20/20  2215     History Chief Complaint  Patient presents with  . Anxiety    [name] is a 60 y.o. male presents to the Emergency Department complaining of gradual, persistent, progressively worsening anxiety and depression onset 3 days ago. Associated symptoms include feelings of helplessness and hopelessness.  Patient reports he has a long history of anxiety and depression.  Additionally he has a history of hypertension and diabetes.  He reports he is previously been admitted for suicidal ideation.  Patient reports he became angry several days ago and stopped taking all of his medications.  This is when his symptoms worsen.  He denies headache, neck pain, chest pain, fever, chills, abdominal pain, nausea, vomiting, diarrhea, weakness, dizziness, syncope.  Patient denies any specific episodes that worsened his depression and anxiety.  He denies alcohol and drug usage.  Denies suicidal ideation or homicidal ideation.  Denies any harmful activities in the last 3 days.    The history is provided by the patient and medical records. No language interpreter was used.       Past Medical History:  Diagnosis Date  . Constipation   . Depression   . Diabetes mellitus   . Hypertension   . Neuropathy     Patient Active Problem List   Diagnosis Date Noted  . Hypertension 09/09/2011  . Diabetes mellitus 09/09/2011  . Suicidal ideation 09/09/2011  . Needs sleep apnea assessment 09/09/2011    History reviewed. No pertinent surgical history.     History reviewed. No pertinent family history.  Social History   Tobacco Use  . Smoking status: Never Smoker  . Smokeless tobacco: Never Used  Substance Use Topics  . Alcohol use: No  . Drug use: No    Home Medications Prior to Admission medications   Medication Sig Start Date End Date Taking? Authorizing Provider  amLODipine  (NORVASC) 10 MG tablet Take 10 mg by mouth daily.   Yes [provider]  atorvastatin (LIPITOR) 40 MG tablet Take 40 mg by mouth daily.   Yes [provider]  buPROPion (WELLBUTRIN XL) 150 MG 24 hr tablet Take 150 mg by mouth daily.   Yes [provider]  hydrOXYzine (VISTARIL) 50 MG capsule Take 50 mg by mouth at bedtime.   Yes [provider]  Multiple Vitamin (MULTIVITAMIN ADULT) TABS Take 1 tablet by mouth daily.   Yes [provider]  polyethylene glycol (MIRALAX / GLYCOLAX) 17 g packet Take 17 g by mouth daily.   Yes [provider]    Allergies    Ace inhibitors  Review of Systems   Review of Systems  Constitutional: Negative for appetite change, diaphoresis, fatigue, fever and unexpected weight change.  HENT: Negative for mouth sores.   Eyes: Negative for visual disturbance.  Respiratory: Negative for cough, chest tightness, shortness of breath and wheezing.   Cardiovascular: Negative for chest pain.  Gastrointestinal: Negative for abdominal pain, constipation, diarrhea, nausea and vomiting.  Endocrine: Negative for polydipsia, polyphagia and polyuria.  Genitourinary: Negative for dysuria, frequency, hematuria and urgency.  Musculoskeletal: Negative for back pain and neck stiffness.  Skin: Negative for rash.  Allergic/Immunologic: Negative for immunocompromised state.  Neurological: Negative for syncope, light-headedness and headaches.  Hematological: Does not bruise/bleed easily.  Psychiatric/Behavioral: Positive for dysphoric mood. Negative for sleep disturbance. The patient is nervous/anxious.     Physical Exam Updated Vital Signs BP Marland Kitchen)  158/94 (BP Location: Right Arm)   Pulse 79   Temp 98.1 F (36.7 C) (Oral)   Resp 19   Ht 5\' 7"  (1.702 m)   Wt 89.4 kg   SpO2 99%   BMI 30.85 kg/m   Physical Exam Vitals and nursing note reviewed.  Constitutional:      General: He is not in acute distress.    Appearance: He  is not diaphoretic.  HENT:     Head: Normocephalic.  Eyes:     General: No scleral icterus.    Conjunctiva/sclera: Conjunctivae normal.  Cardiovascular:     Rate and Rhythm: Normal rate and regular rhythm.     Pulses: Normal pulses.          Radial pulses are 2+ on the right side and 2+ on the left side.  Pulmonary:     Effort: No tachypnea, accessory muscle usage, prolonged expiration, respiratory distress or retractions.     Breath sounds: Normal breath sounds. No stridor.     Comments: Equal chest rise. No increased work of breathing. Abdominal:     General: There is no distension.     Palpations: Abdomen is soft.     Tenderness: There is no abdominal tenderness. There is no guarding or rebound.  Musculoskeletal:     Cervical back: Normal range of motion.     Comments: Moves all extremities equally and without difficulty.  Skin:    General: Skin is warm and dry.     Capillary Refill: Capillary refill takes less than 2 seconds.  Neurological:     Mental Status: He is alert.     GCS: GCS eye subscore is 4. GCS verbal subscore is 5. GCS motor subscore is 6.     Comments: Speech is clear and goal oriented.  Psychiatric:        Mood and Affect: Mood is anxious.        Speech: Speech normal.        Behavior: Behavior normal. Behavior is cooperative.        Thought Content: Thought content does not include homicidal or suicidal ideation. Thought content does not include homicidal or suicidal plan.     ED Results / Procedures / Treatments   Labs (all labs ordered are listed, but only abnormal results are displayed) Labs Reviewed  COMPREHENSIVE METABOLIC PANEL - Abnormal; Notable for the following components:      Result Value   Glucose, Bld 106 (*)    All other components within normal limits  SALICYLATE LEVEL - Abnormal; Notable for the following components:   Salicylate Lvl <7.0 (*)    All other components within normal limits  ACETAMINOPHEN LEVEL - Abnormal; Notable for  the following components:   Acetaminophen (Tylenol), Serum <10 (*)    All other components within normal limits  RESP PANEL BY RT-PCR (FLU A&B, COVID) ARPGX2  ETHANOL  CBC  RAPID URINE DRUG SCREEN, HOSP PERFORMED    Procedures Procedures   Medications Ordered in ED Medications  amLODipine (NORVASC) tablet 10 mg (10 mg Oral Given 11/21/20 0237)  atorvastatin (LIPITOR) tablet 40 mg (has no administration in time range)  buPROPion (WELLBUTRIN XL) 24 hr tablet 150 mg (has no administration in time range)  hydrOXYzine (ATARAX/VISTARIL) tablet 50 mg (0 mg Oral Hold 11/21/20 0249)    ED Course  I have reviewed the triage vital signs and the nursing notes.  Pertinent labs & imaging results that were available during my care of the patient were  reviewed by me and considered in my medical decision making (see chart for details).  Clinical Course as of 11/21/20 0709  Sun Nov 21, 2020  0045 BP(!): 171/104 HTN noted - pt has not been taking his medications.  Home meds ordered.  [HM]    Clinical Course User Index [HM] Hillari Zumwalt, Boyd Kerbs   MDM Rules/Calculators/A&P                           Patient presents to the emergency department with anxiety and depression.  He reports he feels hopeless and helpless.  Not currently suicidal or homicidal.  He is asking for help.  Stop taking his medication several days ago.  Hypertensive however has not taken his home medications.  Home medications ordered.  At this time there is no medical emergency which requires intervention.  Will TTS.  Patient here voluntarily.  If he decides to leave I do not believe he requires IVC.  3:30AM Patient evaluated by TTS.  He does not meet inpatient criteria.  He remains without suicidal ideation.  I do believe he is safe for discharge home.  Vital signs significantly improved after home medications given.  Encourage patient to take all of his home medications as prescribed and follow-up with his primary care.   Resources given for counseling in the outpatient setting.  BP 127/81 (BP Location: Left Arm)   Pulse 79   Temp 98.4 F (36.9 C) (Oral)   Resp 18   Ht 5\' 7"  (1.702 m)   Wt 89.4 kg   SpO2 98%   BMI 30.85 kg/m    Final Clinical Impression(s) / ED Diagnoses Final diagnoses:  Anxiety  Depression, unspecified depression type  Hypertension, unspecified type    Rx / DC Orders ED Discharge Orders    None       11/21/20 01/21/21    [id number], MD 11/21/20 [phone]
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
## 2020-11-21 NOTE — ED Notes (Signed)
Psych packet completed, belongings inventoried and placed in locker number 4.

## 2020-11-21 NOTE — ED Notes (Signed)
Pt refusing hydralazine and metoprolol

## 2020-11-21 NOTE — ED Notes (Signed)
TTS in process 

## 2020-11-21 NOTE — ED Notes (Signed)
ED Provider at bedside. 

## 2020-11-29 DIAGNOSIS — R4589 Other symptoms and signs involving emotional state: Secondary | ICD-10-CM | POA: Insufficient documentation

## 2020-11-29 DIAGNOSIS — F4323 Adjustment disorder with mixed anxiety and depressed mood: Secondary | ICD-10-CM | POA: Insufficient documentation

## 2020-11-29 DIAGNOSIS — F332 Major depressive disorder, recurrent severe without psychotic features: Secondary | ICD-10-CM | POA: Insufficient documentation

## 2020-11-29 DIAGNOSIS — E785 Hyperlipidemia, unspecified: Secondary | ICD-10-CM | POA: Insufficient documentation

## 2020-12-01 DIAGNOSIS — L853 Xerosis cutis: Secondary | ICD-10-CM | POA: Insufficient documentation

## 2020-12-28 DIAGNOSIS — F429 Obsessive-compulsive disorder, unspecified: Secondary | ICD-10-CM | POA: Insufficient documentation

## 2020-12-29 DIAGNOSIS — F325 Major depressive disorder, single episode, in full remission: Secondary | ICD-10-CM | POA: Insufficient documentation

## 2023-02-02 DIAGNOSIS — F330 Major depressive disorder, recurrent, mild: Secondary | ICD-10-CM | POA: Insufficient documentation

## 2023-05-19 ENCOUNTER — Other Ambulatory Visit: Payer: Self-pay

## 2023-05-19 ENCOUNTER — Emergency Department: Payer: Medicare HMO

## 2023-05-19 ENCOUNTER — Emergency Department
Admission: EM | Admit: 2023-05-19 | Discharge: 2023-05-19 | Disposition: A | Discharge status: Home / Self Care | Payer: Medicare HMO | Attending: Emergency Medicine | Admitting: Emergency Medicine

## 2023-05-19 DIAGNOSIS — M25552 Pain in left hip: Secondary | ICD-10-CM | POA: Insufficient documentation

## 2023-05-19 DIAGNOSIS — Z760 Encounter for issue of repeat prescription: Secondary | ICD-10-CM | POA: Insufficient documentation

## 2023-05-19 DIAGNOSIS — I10 Essential (primary) hypertension: Secondary | ICD-10-CM | POA: Diagnosis not present

## 2023-05-19 DIAGNOSIS — M25551 Pain in right hip: Secondary | ICD-10-CM

## 2023-05-19 DIAGNOSIS — E785 Hyperlipidemia, unspecified: Secondary | ICD-10-CM | POA: Diagnosis not present

## 2023-05-19 DIAGNOSIS — E119 Type 2 diabetes mellitus without complications: Secondary | ICD-10-CM | POA: Diagnosis not present

## 2023-05-19 DIAGNOSIS — F32A Depression, unspecified: Secondary | ICD-10-CM | POA: Insufficient documentation

## 2023-05-19 MED ORDER — METFORMIN HCL 500 MG PO TABS: 1000.0000 mg | ORAL_TABLET | Freq: Two times a day (BID) | ORAL | 0 refills | Status: DC

## 2023-05-19 MED ORDER — AMLODIPINE BESYLATE 10 MG PO TABS: 10.0000 mg | ORAL_TABLET | Freq: Every day | ORAL | 0 refills | Status: DC

## 2023-05-19 MED ORDER — ATORVASTATIN CALCIUM 40 MG PO TABS: 40.0000 mg | ORAL_TABLET | Freq: Every day | ORAL | 0 refills | Status: DC

## 2023-05-19 MED ORDER — KETOROLAC TROMETHAMINE 15 MG/ML IJ SOLN
15.0000 mg | Freq: Once | INTRAMUSCULAR | Status: AC
  Administered 2023-05-19: 15 mg via INTRAMUSCULAR
  Filled 2023-05-19: qty 1

## 2023-05-19 MED ORDER — LIDOCAINE 5 % EX PTCH
1.0000 | MEDICATED_PATCH | CUTANEOUS | Status: DC
  Administered 2023-05-19: 1 via TRANSDERMAL
  Filled 2023-05-19: qty 1

## 2023-05-19 MED ORDER — BUPROPION HCL ER (XL) 150 MG PO TB24: 150.0000 mg | ORAL_TABLET | Freq: Every day | ORAL | 0 refills | Status: DC

## 2023-05-19 NOTE — Discharge Instructions (Addendum)
You have a bony lesion in your right acetabulum as we discussed, please follow-up with orthopedics regarding this.  Your medicines have been refilled for you per your request.  Please return for any new, worsening, or change in symptoms or other concerns.  It was a pleasure caring for you today.

## 2023-05-19 NOTE — ED Triage Notes (Signed)
Pt ambulatory to ER for pain right tailbone and down thigh going on for 2 weeks on and off. Says pain is dependent on position and worse in the morning. Has tried OTC medication and creams

## 2023-05-19 NOTE — ED Provider Notes (Signed)
[facility] Provider Note    Event Date/Time   First MD Initiated Contact with Patient 05/19/23 1009     (approximate)   History   Back Pain   HPI  [name] is a 62 y.o. male with a past medical history of hypertension, hypercholesterolemia, diabetes who presents today for evaluation of right buttock pain that radiates down to his thigh for the past 2 weeks.  He reports that sometimes he feels it and sometimes he does not.  No fevers or chills.  No injuries.  No difficulty ambulating.  No skin changes.  No urinary or fecal incontinence or retention, no saddle anesthesia.  He reports that he has been walking a lot.  No falls.  Here is also requesting a refill of his atorvastatin, amlodipine, bupropion, metformin.  He reports that he has not run out, but only has a couple of days left.  He has not missed any doses.  Patient Active Problem List   Diagnosis Date Noted   Hypertension 09/09/2011   Diabetes mellitus (HCC) 09/09/2011   Suicidal ideation 09/09/2011   Needs sleep apnea assessment 09/09/2011          Physical Exam   Triage Vital Signs: ED Triage Vitals  Encounter Vitals Group     BP 05/19/23 0957 (!) 146/118     Systolic BP Percentile --      Diastolic BP Percentile --      Pulse Rate 05/19/23 0955 85     Resp 05/19/23 0955 16     Temp 05/19/23 0957 98.2 F (36.8 C)     Temp Source 05/19/23 0957 Oral     SpO2 05/19/23 0955 100 %     Weight --      Height --      Head Circumference --      Peak Flow --      Pain Score 05/19/23 0956 7     Pain Loc --      Pain Education --      Exclude from Growth Chart --     Most recent vital signs: Vitals:   05/19/23 0955 05/19/23 0957  BP:  (!) 146/118  Pulse: 85   Resp: 16   Temp:  98.2 F (36.8 C)  SpO2: 100%     Physical Exam Vitals and nursing note reviewed.  Constitutional:      General: Awake and alert. No acute distress.    Appearance: Normal appearance. The patient is  normal weight.  HENT:     Head: Normocephalic and atraumatic.     Mouth: Mucous membranes are moist.  Eyes:     General: PERRL. Normal EOMs        Right eye: No discharge.        Left eye: No discharge.     Conjunctiva/sclera: Conjunctivae normal.  Cardiovascular:     Rate and Rhythm: Normal rate and regular rhythm.     Pulses: Normal pulses.  Pulmonary:     Effort: Pulmonary effort is normal. No respiratory distress.     Breath sounds: Normal breath sounds.  Abdominal:     Abdomen is soft. There is no abdominal tenderness. No rebound or guarding. No distention. Musculoskeletal:        General: No swelling. Normal range of motion.     Cervical back: Normal range of motion and neck supple.  Back: No midline tenderness.  Mild tenderness over the right buttock and right lateral hip.  Strength and  sensation 5/5 to bilateral lower extremities. Normal great toe extension against resistance. Normal sensation throughout feet. Normal patellar reflexes. Negative SLR and opposite SLR bilaterally. Negative FABER test Skin:    General: Skin is warm and dry.     Capillary Refill: Capillary refill takes less than 2 seconds.     Findings: No rash.  Neurological:     Mental Status: The patient is awake and alert.      ED Results / Procedures / Treatments   Labs (all labs ordered are listed, but only abnormal results are displayed) Labs Reviewed - No data to display   EKG     RADIOLOGY I independently reviewed and interpreted imaging and agree with radiologists findings.     PROCEDURES:  Critical Care performed:   Procedures   MEDICATIONS ORDERED IN ED: Medications  lidocaine (LIDODERM) 5 % 1 patch (1 patch Transdermal Patch Applied 05/19/23 1047)  ketorolac (TORADOL) 15 MG/ML injection 15 mg (15 mg Intramuscular Given 05/19/23 1047)     IMPRESSION / MDM / ASSESSMENT AND PLAN / ED COURSE  I reviewed the triage vital signs and the nursing notes.   Differential diagnosis  includes, but is not limited to, lumbar radiculopathy, greater trochanteric bursitis, osteoarthritis, piriformis syndrome.  Patient is awake and alert, hemodynamically stable and afebrile.  He is able to ambulate with a steady gait, unassisted.  There are no skin changes or overlying abnormalities.  He has normal strength and sensation in his bilateral lower extremities. 5 out of 5 strength with intact sensation to extensor hallucis dorsiflexion and plantarflexion of bilateral lower extremities with normal patellar reflexes bilaterally.  No major trauma, no midline tenderness, no history or physical exam findings to suggest cauda equina syndrome or spinal cord compression. No focal neurological deficits on exam. No constitutional symptoms or history of immunosuppression or IVDA to suggest potential for epidural abscess. Not anticoagulated, no history of bleeding diastasis to suggest risk for epidural hematoma. No chronic steroid use or advanced age or history of malignancy to suggest proclivity towards pathological fracture.  No abdominal pain or flank pain to suggest kidney stone, no history of kidney stone.  No fever or dysuria or CVAT to suggest pyelonephritis .  No chest pain, back pain, shortness of breath, neurological deficits, to suggest vascular catastrophe, and pulses are equal in all 4 extremities.    X-ray obtained reveals a nonaggressive appearing lucent focus along the right iliac bone above the acetabulum.  I recommended that he follow-up with orthopedics for further evaluation of this.  He was given the appropriate information and instructed to call on Monday when the office is open.  He agrees to do so.  Meanwhile his medications were refilled for him per his request.  We discussed return precautions and outpatient follow-up.  Patient understands and agrees with plan.  He was discharged in stable condition.   Patient's presentation is most consistent with acute complicated illness / injury  requiring diagnostic workup.      FINAL CLINICAL IMPRESSION(S) / ED DIAGNOSES   Final diagnoses:  Pain of right hip  Hyperlipidemia, unspecified hyperlipidemia type  Hypertension, unspecified type  Type 2 diabetes mellitus without complication, without long-term current use of insulin (HCC)  Depression, unspecified depression type  Medication refill     Rx / DC Orders   ED Discharge Orders          Ordered    amLODipine (NORVASC) 10 MG tablet  Daily        05/19/23  1107    atorvastatin (LIPITOR) 40 MG tablet  Daily        05/19/23 1107    buPROPion (WELLBUTRIN XL) 150 MG 24 hr tablet  Daily        05/19/23 1107    metFORMIN (GLUCOPHAGE) 500 MG tablet  2 times daily with meals        05/19/23 1107             Note:  This document was prepared using Dragon voice recognition software and may include unintentional dictation errors.   Jackelyn Hoehn, PA-C 05/19/23 1325    Concha Se, MD 05/20/23 [phone]

## 2023-05-24 ENCOUNTER — Emergency Department
Admission: EM | Admit: 2023-05-24 | Discharge: 2023-05-24 | Disposition: A | Discharge status: Home / Self Care | Payer: Medicare HMO | Attending: Emergency Medicine | Admitting: Emergency Medicine

## 2023-05-24 ENCOUNTER — Other Ambulatory Visit: Payer: Self-pay

## 2023-05-24 DIAGNOSIS — M25551 Pain in right hip: Secondary | ICD-10-CM | POA: Insufficient documentation

## 2023-05-24 DIAGNOSIS — E1140 Type 2 diabetes mellitus with diabetic neuropathy, unspecified: Secondary | ICD-10-CM | POA: Insufficient documentation

## 2023-05-24 DIAGNOSIS — I10 Essential (primary) hypertension: Secondary | ICD-10-CM | POA: Insufficient documentation

## 2023-05-24 MED ORDER — OXYCODONE HCL 5 MG PO TABS
5.0000 mg | ORAL_TABLET | Freq: Three times a day (TID) | ORAL | 0 refills | Status: DC | PRN
Start: 2023-05-24 — End: 2023-09-20

## 2023-05-24 MED ORDER — KETOROLAC TROMETHAMINE 30 MG/ML IJ SOLN
30.0000 mg | Freq: Once | INTRAMUSCULAR | Status: AC
  Administered 2023-05-24: 30 mg via INTRAMUSCULAR
  Filled 2023-05-24: qty 1

## 2023-05-24 MED ORDER — OXYCODONE HCL 5 MG PO TABS
5.0000 mg | ORAL_TABLET | Freq: Once | ORAL | Status: AC
  Administered 2023-05-24: 5 mg via ORAL
  Filled 2023-05-24: qty 1

## 2023-05-24 MED ORDER — NAPROXEN 500 MG PO TABS
500.0000 mg | ORAL_TABLET | Freq: Two times a day (BID) | ORAL | 0 refills | Status: DC
Start: 2023-05-24 — End: 2023-07-16

## 2023-05-24 NOTE — ED Notes (Signed)
Patient discharged at this time. Ambulated to lobby with independent and steady gait. Breathing unlabored speaking in full sentences. Verbalized understanding of all discharge, follow up, and medication teaching. Discharged homed with all belongings.   

## 2023-05-24 NOTE — ED Triage Notes (Signed)
R hip pain without fall or injury. Reports ongoing x 1.5 months. Ambulatory on scene with EMS. Seen on Saturday for same. Pt alert and oriented. Breathing unlabored speaking in full sentences. Ambulatory. In wheelchair per request.

## 2023-05-24 NOTE — ED Triage Notes (Signed)
EMS brings pt in from home for c/o rt hip pain; seen 10/5 for same with neg findings

## 2023-05-24 NOTE — ED Provider Notes (Signed)
[facility] Provider Note    Event Date/Time   First MD Initiated Contact with Patient 05/24/23 0422     (approximate)   History   Hip Pain   HPI  Spiro Ausborn is a 62 y.o. male   Past medical history of depression, diabetes, hypertension, neuropathy, who presents to the emergency department with ongoing right-sided hip pain.  Has been relieved with menthol analgesic creams.  Has an appoint with orthopedist next week after being seen in the emergency department last week for right-sided hip pain found to have a nonaggressive appearing lucent focus along the right iliac bone above the acetabulum, with degenerative changes.  He denies swelling, erythema, warmth, fever, or new trauma.   External Medical Documents Reviewed: Hip x-ray from 05/19/2023 with the above findings.      Physical Exam   Triage Vital Signs: ED Triage Vitals  Encounter Vitals Group     BP 05/24/23 0111 (!) 157/99     Systolic BP Percentile --      Diastolic BP Percentile --      Pulse Rate 05/24/23 0111 88     Resp 05/24/23 0111 18     Temp 05/24/23 0111 98.4 F (36.9 C)     Temp Source 05/24/23 0111 Oral     SpO2 05/24/23 0111 98 %     Weight 05/24/23 0110 197 lb (89.4 kg)     Height 05/24/23 0110 5\' 7"  (1.702 m)     Head Circumference --      Peak Flow --      Pain Score 05/24/23 0109 7     Pain Loc --      Pain Education --      Exclude from Growth Chart --     Most recent vital signs: Vitals:   05/24/23 0111  BP: (!) 157/99  Pulse: 88  Resp: 18  Temp: 98.4 F (36.9 C)  SpO2: 98%    General: Awake, no distress.  CV:  Good peripheral perfusion.  Resp:  Normal effort.  Abd:  No distention.  Other:  Able to range at the hip.  Neurovascular intact.  No swelling erythema or warmth.  Compartments soft throughout the entire affected right lower extremity.   ED Results / Procedures / Treatments   Labs (all labs ordered are listed, but only abnormal results  are displayed) Labs Reviewed - No data to display  =  PROCEDURES:  Critical Care performed: No  Procedures   MEDICATIONS ORDERED IN ED: Medications  ketorolac (TORADOL) 30 MG/ML injection 30 mg (has no administration in time range)  oxyCODONE (Oxy IR/ROXICODONE) immediate release tablet 5 mg (has no administration in time range)     IMPRESSION / MDM / ASSESSMENT AND PLAN / ED COURSE  I reviewed the triage vital signs and the nursing notes.                                Patient's presentation is most consistent with acute presentation with potential threat to life or bodily function.  Differential diagnosis includes, but is not limited to, fracture/dislocation, septic joint, osteoarthritis, cancer   The patient is on the cardiac monitor to evaluate for evidence of arrhythmia and/or significant heart rate changes.  MDM:    Patient with ongoing right hip pain requesting medication recommendations for at home treatment in anticipation of his orthopedics follow-up next week.  Advise naproxen, Tylenol, and will  give oxycodone for breakthrough pain and continue with analgesic creams.  Will defer further x-ray or imaging given imaging obtained 1 week ago with no further changes or trauma, very low clinical suspicion for septic arthritis, compartment syndrome, other emergent orthopedic conditions.  Follow-up with orthopedist as scheduled.      FINAL CLINICAL IMPRESSION(S) / ED DIAGNOSES   Final diagnoses:  Right hip pain     Rx / DC Orders   ED Discharge Orders          Ordered    naproxen (NAPROSYN) 500 MG tablet  2 times daily with meals        05/24/23 0435    oxyCODONE (ROXICODONE) 5 MG immediate release tablet  Every 8 hours PRN        05/24/23 0435             Note:  This document was prepared using Dragon voice recognition software and may include unintentional dictation errors.    Pilar Jarvis, MD 05/24/23 [phone]

## 2023-05-24 NOTE — Discharge Instructions (Addendum)
See your orthopedist on October 15 as scheduled.  Take naproxen 500 mg twice daily.  Stop taking Motrin/ibuprofen when taking this medication.  Continue to use your muscle cream.  Use oxycodone as prescribed only for severe/breakthrough pain.  Thank you for choosing [place] for your health care today!  Please see your primary doctor this week for a follow up appointment.   If you have any new, worsening, or unexpected symptoms call your doctor right away or come back to the emergency department for reevaluation.  It was my pleasure to care for you today.   Daneil Dan Modesto Charon, MD

## 2023-07-16 ENCOUNTER — Other Ambulatory Visit: Payer: Self-pay

## 2023-07-16 ENCOUNTER — Emergency Department
Admission: EM | Admit: 2023-07-16 | Discharge: 2023-07-16 | Disposition: A | Discharge status: Home / Self Care | Payer: Medicare HMO | Attending: Emergency Medicine | Admitting: Emergency Medicine

## 2023-07-16 DIAGNOSIS — Z760 Encounter for issue of repeat prescription: Secondary | ICD-10-CM | POA: Insufficient documentation

## 2023-07-16 DIAGNOSIS — E119 Type 2 diabetes mellitus without complications: Secondary | ICD-10-CM | POA: Diagnosis not present

## 2023-07-16 DIAGNOSIS — I10 Essential (primary) hypertension: Secondary | ICD-10-CM | POA: Diagnosis not present

## 2023-07-16 MED ORDER — NAPROXEN 500 MG PO TABS
500.0000 mg | ORAL_TABLET | Freq: Two times a day (BID) | ORAL | 0 refills | Status: DC
Start: 2023-07-16 — End: 2023-10-22

## 2023-07-16 MED ORDER — AMLODIPINE BESYLATE 10 MG PO TABS: 10.0000 mg | ORAL_TABLET | Freq: Every day | ORAL | 0 refills | Status: DC

## 2023-07-16 MED ORDER — BUPROPION HCL ER (XL) 150 MG PO TB24: 150.0000 mg | ORAL_TABLET | Freq: Every day | ORAL | 0 refills | Status: DC

## 2023-07-16 MED ORDER — METFORMIN HCL 500 MG PO TABS: 1000.0000 mg | ORAL_TABLET | Freq: Two times a day (BID) | ORAL | 2 refills | Status: DC

## 2023-07-16 MED ORDER — ATORVASTATIN CALCIUM 40 MG PO TABS: 40.0000 mg | ORAL_TABLET | Freq: Every day | ORAL | 0 refills | Status: DC

## 2023-07-16 NOTE — Discharge Instructions (Signed)

## 2023-07-16 NOTE — ED Provider Notes (Signed)
[facility] Provider Note    Event Date/Time   First MD Initiated Contact with Patient 07/16/23 1632     (approximate)   History   neuropathy and Medication Refill   HPI  [name] is a 62 y.o. male with history of hypertension, diabetes, diabetic neuropathy presents emergency department with concerns of being out of his medications.  Patient states that he knows he needs to get a primary care provider.  But he does not want to run out of medications.  Denies chest pain, shortness of breath, abdominal pain, vomiting or diarrhea.      Physical Exam   Triage Vital Signs: ED Triage Vitals  Encounter Vitals Group     BP 07/16/23 1520 (!) 167/100     Systolic BP Percentile --      Diastolic BP Percentile --      Pulse Rate 07/16/23 1520 (!) 105     Resp 07/16/23 1520 18     Temp 07/16/23 1520 97.9 F (36.6 C)     Temp src --      SpO2 07/16/23 1520 96 %     Weight 07/16/23 1523 207 lb (93.9 kg)     Height 07/16/23 1523 5\' 7"  (1.702 m)     Head Circumference --      Peak Flow --      Pain Score 07/16/23 1523 8     Pain Loc --      Pain Education --      Exclude from Growth Chart --     Most recent vital signs: Vitals:   07/16/23 1520 07/16/23 1652  BP: (!) 167/100 (!) 166/105  Pulse: (!) 105 92  Resp: 18 14  Temp: 97.9 F (36.6 C) 98.7 F (37.1 C)  SpO2: 96% 99%     General: Awake, no distress.   CV:  Good peripheral perfusion. regular rate and  rhythm Resp:  Normal effort. Lungs cta Abd:  No distention.   Other:      ED Results / Procedures / Treatments   Labs (all labs ordered are listed, but only abnormal results are displayed) Labs Reviewed - No data to display   EKG     RADIOLOGY     PROCEDURES:   Procedures   MEDICATIONS ORDERED IN ED: Medications - No data to display   IMPRESSION / MDM / ASSESSMENT AND PLAN / ED COURSE  I reviewed the triage vital signs and the nursing notes.                               Differential diagnosis includes, but is not limited to, hypertension, diabetes, diabetic neuropathy, med refill  Patient's presentation is most consistent with acute, uncomplicated illness.   Patient is basically here to have his medications refilled.  He did once again encouraged him to obtain a PCP.  Gave him a list of PCPs in the area that are taking new patients.  Gave him 90-day supply on his blood pressure medication, cholesterol medication, blood pressure medication, Wellbutrin, and naproxen.  He is in agreement treatment plan.  Discharged in stable condition.      FINAL CLINICAL IMPRESSION(S) / ED DIAGNOSES   Final diagnoses:  Encounter for medication refill  Hypertension, unspecified type     Rx / DC Orders   ED Discharge Orders          Ordered    Ambulatory Referral to  Primary Care (Establish Care)       Comments: Needs pcp   07/16/23 1639    amLODipine (NORVASC) 10 MG tablet  Daily        07/16/23 1639    atorvastatin (LIPITOR) 40 MG tablet  Daily        07/16/23 1639    buPROPion (WELLBUTRIN XL) 150 MG 24 hr tablet  Daily        07/16/23 1639    metFORMIN (GLUCOPHAGE) 500 MG tablet  2 times daily with meals        07/16/23 1639    naproxen (NAPROSYN) 500 MG tablet  2 times daily with meals        07/16/23 1639             Note:  This document was prepared using Dragon voice recognition software and may include unintentional dictation errors.    Faythe Ghee, PA-C 07/16/23 1748    Merwyn Katos, MD 07/16/23 2114

## 2023-07-16 NOTE — ED Triage Notes (Signed)
Pt reports here for ran out of blood pressure meds, metformin and meds for neuropathy. States did not sleep last night d/t neuropathy in both feet

## 2023-09-20 ENCOUNTER — Encounter: Payer: Self-pay | Admitting: Physician Assistant

## 2023-09-20 ENCOUNTER — Ambulatory Visit: Payer: Medicare HMO | Admitting: Physician Assistant

## 2023-09-20 VITALS — BP 122/78 | HR 91 | Temp 98.5°F | Ht 67.0 in | Wt 206.0 lb

## 2023-09-20 DIAGNOSIS — Z7984 Long term (current) use of oral hypoglycemic drugs: Secondary | ICD-10-CM

## 2023-09-20 DIAGNOSIS — I10 Essential (primary) hypertension: Secondary | ICD-10-CM | POA: Diagnosis not present

## 2023-09-20 DIAGNOSIS — E785 Hyperlipidemia, unspecified: Secondary | ICD-10-CM | POA: Diagnosis not present

## 2023-09-20 DIAGNOSIS — F3340 Major depressive disorder, recurrent, in remission, unspecified: Secondary | ICD-10-CM | POA: Diagnosis not present

## 2023-09-20 DIAGNOSIS — E1169 Type 2 diabetes mellitus with other specified complication: Secondary | ICD-10-CM

## 2023-09-20 LAB — POCT GLYCOSYLATED HEMOGLOBIN (HGB A1C): Hemoglobin A1C: 7.7 % — AB (ref 4.0–5.6)

## 2023-09-20 MED ORDER — BUPROPION HCL ER (XL) 150 MG PO TB24
150.0000 mg | ORAL_TABLET | Freq: Every day | ORAL | 3 refills | Status: AC
Start: 2023-09-20 — End: ?

## 2023-09-20 MED ORDER — ATORVASTATIN CALCIUM 40 MG PO TABS: 40.0000 mg | ORAL_TABLET | Freq: Every day | ORAL | 3 refills | Status: AC

## 2023-09-20 MED ORDER — METFORMIN HCL 500 MG PO TABS: 1000.0000 mg | ORAL_TABLET | Freq: Two times a day (BID) | ORAL | 3 refills | Status: AC

## 2023-09-20 MED ORDER — AMLODIPINE BESYLATE 10 MG PO TABS
10.0000 mg | ORAL_TABLET | Freq: Every day | ORAL | 3 refills | Status: AC
Start: 2023-09-20 — End: ?

## 2023-09-20 NOTE — Patient Instructions (Signed)
 -  It was a pleasure to see you today! Please review your visit summary for helpful information -I would encourage you to follow your care via MyChart where you can access lab results, notes, messages, and more -If you feel that we did a nice job today, please complete your after-visit survey and leave [place] a Google review! Your CMA today was Mariann Barter and your provider was Alvester Morin, PA-C, DMSc -Please return for follow-up in about 3 months

## 2023-09-20 NOTE — Progress Notes (Signed)
 Date:  09/20/2023   Name:  [name]   DOB:  [birth year]   MRN:  [id number]   Chief Complaint: Establish Care  HPI [name] is a very pleasant 63 year old male with a history of DM2, obesity, HLD, HTN, OSA, depression who presents to the clinic today to establish care.  He reports good compliance and efficacy of his medications, but will be running out in the next 1 to 2 weeks so is requesting refills.  Otherwise feels fairly well and has no acute complaints.  Previously seen by [facility] primary care with last visit 7 months ago.  Last A1c 6.4% on 12/23/2022.   History of financial difficulty and homelessness, but presently has a stable housing situation.  Lives in a house with a roommate, shared kitchen, good relationship with roommate. Feels safe and stable.    Medication list has been reviewed and updated.  Current Meds  Medication Sig   Multiple Vitamin (MULTIVITAMIN ADULT) TABS Take 1 tablet by mouth daily.   naproxen  (NAPROSYN ) 500 MG tablet Take 1 tablet (500 mg total) by mouth 2 (two) times daily with a meal.   polyethylene glycol (MIRALAX / GLYCOLAX) 17 g packet Take 17 g by mouth daily.   Saw Palmetto, Serenoa repens, (SAW PALMETTO PO) Take by mouth.   Turmeric (QC TUMERIC COMPLEX PO) Take by mouth.   VITAMIN D PO Take by mouth.   [DISCONTINUED] amLODipine  (NORVASC ) 10 MG tablet Take 1 tablet (10 mg total) by mouth daily.   [DISCONTINUED] atorvastatin  (LIPITOR) 40 MG tablet Take 1 tablet (40 mg total) by mouth daily.   [DISCONTINUED] buPROPion  (WELLBUTRIN  XL) 150 MG 24 hr tablet Take 1 tablet (150 mg total) by mouth daily.   [DISCONTINUED] hydrOXYzine  (VISTARIL ) 50 MG capsule Take 50 mg by mouth at bedtime.   [DISCONTINUED] metFORMIN  (GLUCOPHAGE ) 500 MG tablet Take 2 tablets (1,000 mg total) by mouth 2 (two) times daily with a meal.     Review of Systems  Patient Active Problem List   Diagnosis Date Noted   Major depressive disorder in remission (HCC) 12/29/2020   OCD  (obsessive compulsive disorder) 12/28/2020   Xerosis of skin 12/01/2020   Hyperlipidemia 11/29/2020   PAD (peripheral artery disease) (HCC) 04/16/2017   Type 2 diabetes mellitus with hyperlipidemia (HCC) 04/29/2011   Essential hypertension 02/26/2010   Diabetic polyneuropathy (HCC) 02/26/2010   Obesity 02/26/2010   OSA (obstructive sleep apnea) 02/26/2010    Allergies  Allergen Reactions   Ace Inhibitors Other (See Comments)    cough   Angiotensin Receptor Blockers Other (See Comments)    Other reaction(s): Cough    Immunization History  Administered Date(s) Administered   Fluzone Influenza virus vaccine,trivalent (IIV3), split virus 08/01/2013   Influenza, Seasonal, Injecte, Preservative Fre 05/09/2011   Influenza,inj,Quad PF,6+ Mos 08/06/2020, 05/26/2022   Influenza-Unspecified 07/15/2023   PFIZER(Purple Top)SARS-COV-2 Vaccination 02/12/2020, 03/11/2020   PNEUMOCOCCAL CONJUGATE-20 02/02/2023   Tdap 02/02/2023    Past Surgical History:  Procedure Laterality Date   CIRCUMCISION      Social History   Tobacco Use   Smoking status: Never   Smokeless tobacco: Never  Vaping Use   Vaping status: Never Used  Substance Use Topics   Alcohol use: No   Drug use: No    Family History  Problem Relation Age of Onset   Hypertension Mother    Heart disease Mother    Diabetes Mother         09/20/2023    2:59 PM  GAD 7 : Generalized Anxiety Score  Nervous, Anxious, on Edge 0  Control/stop worrying 0  Worry too much - different things 0  Trouble relaxing 0  Restless 0  Easily annoyed or irritable 0  Afraid - awful might happen 0  Total GAD 7 Score 0  Anxiety Difficulty Not difficult at all       09/20/2023    3:10 PM 09/20/2023    2:59 PM  Depression screen PHQ 2/9  Decreased Interest 0 0  Down, Depressed, Hopeless 0 0  PHQ - 2 Score 0 0  Altered sleeping 0   Tired, decreased energy 0   Change in appetite 0   Feeling bad or failure about yourself  0   Trouble  concentrating 0   Moving slowly or fidgety/restless 0   Suicidal thoughts 0   PHQ-9 Score 0   Difficult doing work/chores Not difficult at all     BP Readings from Last 3 Encounters:  09/20/23 122/78  07/16/23 (!) 166/105  05/24/23 (!) 141/76    Wt Readings from Last 3 Encounters:  09/20/23 206 lb (93.4 kg)  07/16/23 207 lb (93.9 kg)  05/24/23 197 lb (89.4 kg)    BP 122/78   Pulse 91   Temp 98.5 F (36.9 C)   Ht 5' 7 (1.702 m)   Wt 206 lb (93.4 kg)   SpO2 96%   BMI 32.26 kg/m   Physical Exam Vitals and nursing note reviewed.  Constitutional:      Appearance: Normal appearance.  Cardiovascular:     Rate and Rhythm: Normal rate and regular rhythm.     Heart sounds: No murmur heard.    No friction rub. No gallop.  Pulmonary:     Effort: Pulmonary effort is normal.     Breath sounds: Normal breath sounds.  Abdominal:     General: There is no distension.  Musculoskeletal:        General: Normal range of motion.  Skin:    General: Skin is warm and dry.  Neurological:     Mental Status: He is alert and oriented to person, place, and time.     Gait: Gait is intact.  Psychiatric:        Mood and Affect: Mood and affect normal.    Lab Results  Component Value Date   HGBA1C 7.7 (A) 09/20/2023   HGBA1C 6.6 (H) 09/08/2011    Recent Labs     Component Value Date/Time   NA 142 11/20/2020 2239   K 3.8 11/20/2020 2239   CL 106 11/20/2020 2239   CO2 27 11/20/2020 2239   GLUCOSE 106 (H) 11/20/2020 2239   BUN 10 11/20/2020 2239   CREATININE 1.07 11/20/2020 2239   CALCIUM  9.8 11/20/2020 2239   PROT 7.6 11/20/2020 2239   ALBUMIN 4.3 11/20/2020 2239   AST 26 11/20/2020 2239   ALT 24 11/20/2020 2239   ALKPHOS 70 11/20/2020 2239   BILITOT 0.9 11/20/2020 2239   GFRNONAA >60 11/20/2020 2239   GFRAA >90 09/05/2011 2230    Lab Results  Component Value Date   WBC 8.0 11/20/2020   HGB 13.9 11/20/2020   HCT 41.8 11/20/2020   MCV 86.5 11/20/2020   PLT 282  11/20/2020   Lab Results  Component Value Date   HGBA1C 7.7 (A) 09/20/2023     Assessment and Plan:  1. Type 2 diabetes mellitus with other specified complication, without long-term current use of insulin  (HCC) (Primary) A1c 7.7% today.  Currently  using metformin  monotherapy, well-tolerated.  Patient seems determined to control and hopefully reverse his diabetes, so we will not be adding pharmacotherapy at today's visit.   Recommend diet low in simple carbohydrates such as white starches (bread, pasta, rice) and refined sugar found in desserts and sweetened beverages including juice, sweet tea, and soda.   - POCT HgB A1C - metFORMIN  (GLUCOPHAGE ) 500 MG tablet; Take 2 tablets (1,000 mg total) by mouth 2 (two) times daily with a meal.  Dispense: 360 tablet; Refill: 3  2. Hyperlipidemia, unspecified hyperlipidemia type Refill atorvastatin  as below, plan to check lipids next time - atorvastatin  (LIPITOR) 40 MG tablet; Take 1 tablet (40 mg total) by mouth daily.  Dispense: 90 tablet; Refill: 3  3. Essential hypertension Refill amlodipine  which seems to be controlling his blood pressure well. - amLODipine  (NORVASC ) 10 MG tablet; Take 1 tablet (10 mg total) by mouth daily.  Dispense: 90 tablet; Refill: 3  4. Recurrent major depressive disorder, in remission (HCC) Refill bupropion  as below, mood stable. - buPROPion  (WELLBUTRIN  XL) 150 MG 24 hr tablet; Take 1 tablet (150 mg total) by mouth daily.  Dispense: 90 tablet; Refill: 3   Return in about 3 months (around 12/18/2023) for OV f/u chronic conditions.    Rolan Hoyle, PA-C, DMSc, Nutritionist [facility] [phone]

## 2023-10-22 ENCOUNTER — Other Ambulatory Visit: Payer: Self-pay | Admitting: Physician Assistant

## 2023-10-22 ENCOUNTER — Telehealth: Payer: Self-pay

## 2023-10-22 MED ORDER — NAPROXEN 500 MG PO TABS: 500.0000 mg | ORAL_TABLET | Freq: Two times a day (BID) | ORAL | 1 refills | Status: AC | PRN

## 2023-10-22 NOTE — Telephone Encounter (Signed)
 Called pharmacy [facility] on [address]. [address]. They stated the medication was ready for pick up at the [facility] Location. Called pt. Pt is aware.  KP

## 2023-10-22 NOTE — Progress Notes (Signed)
 Patient came in requesting refill on naproxen. Also wondering about his bupropion which was sent to [facility] in Feb but Katherene Ponto called them and they said it was actually filled at [facility] for some reason. Patient wants to pick it up at [facility] so that's where I sent his naproxen too. Called patient to inform him but immediately went to VM. LVM for him to call me back to clarify everything.

## 2023-10-22 NOTE — Telephone Encounter (Signed)
 Tried to complete a PA on [email or web address] for bupropion 150 mg tablets 90 tablets for 90 days.  (Key: [id number]) Need Help? Call us at [phone]  Outcome: Available without authorization.  I tried to call the pharmacy to verify if patient can fill this, and pharmacy is closed for lunch until 2pm.

## 2023-12-19 ENCOUNTER — Ambulatory Visit: Payer: Medicare HMO | Admitting: Physician Assistant

## 2024-01-10 ENCOUNTER — Telehealth: Payer: Self-pay | Admitting: Physician Assistant

## 2024-01-10 NOTE — Telephone Encounter (Signed)
 Copied from CRM [id number]. Topic: Medicare AWV >> Jan 10, 2024 10:08 AM Juliana Ocean wrote: Reason for CRM: Called 01/10/2024 to sched AWV - NO VOICEMAIL  Rosalee Collins; Care Guide Ambulatory Clinical Support [facility] Direct Dial: [phone]

## 2024-05-13 ENCOUNTER — Other Ambulatory Visit: Payer: Self-pay

## 2024-06-04 ENCOUNTER — Telehealth: Payer: Self-pay

## 2024-06-04 NOTE — Transitions of Care (Post Inpatient/ED Visit) (Signed)
   06/04/2024  Name: Nadir Vasques MRN: [id number] DOB: 10-03-1960  Today's TOC FU Call Status: Today's TOC FU Call Status:: Unsuccessful Call (1st Attempt) Unsuccessful Call (1st Attempt) Date: 06/04/24  Attempted to reach the patient regarding the most recent Inpatient/ED visit.  Follow Up Plan: Additional outreach attempts will be made to reach the patient to complete the Transitions of Care (Post Inpatient/ED visit) call.   [employer], CMA

## 2024-06-05 NOTE — Transitions of Care (Post Inpatient/ED Visit) (Signed)
   06/05/2024  Name: [name] MRN: [id number] DOB: 1961-07-09  Today's TOC FU Call Status: Today's TOC FU Call Status:: Unsuccessful Call (2nd Attempt) Unsuccessful Call (1st Attempt) Date: 06/04/24 Unsuccessful Call (2nd Attempt) Date: 06/05/24  Attempted to reach the patient regarding the most recent Inpatient/ED visit.  Follow Up Plan: Additional outreach attempts will be made to reach the patient to complete the Transitions of Care (Post Inpatient/ED visit) call.   [employer], CMA

## 2024-06-11 ENCOUNTER — Telehealth: Payer: Self-pay

## 2024-06-11 NOTE — Transitions of Care (Post Inpatient/ED Visit) (Unsigned)
   06/11/2024  Name: Lucille Crichlow MRN: [id number] DOB: 09-24-1960  Today's TOC FU Call Status: Today's TOC FU Call Status:: Unsuccessful Call (1st Attempt) Unsuccessful Call (1st Attempt) Date: 06/11/24  Attempted to reach the patient regarding the most recent Inpatient/ED visit.  Follow Up Plan: Additional outreach attempts will be made to reach the patient to complete the Transitions of Care (Post Inpatient/ED visit) call.   [employer], CMA

## 2024-06-12 NOTE — Transitions of Care (Post Inpatient/ED Visit) (Signed)
   06/12/2024  Name: [name] MRN: [id number] DOB: January 27, 1961  Today's TOC FU Call Status: Today's TOC FU Call Status:: Unsuccessful Call (3rd Attempt) Unsuccessful Call (1st Attempt) Date: 06/11/24 Unsuccessful Call (3rd Attempt) Date: 06/12/24  Attempted to reach the patient regarding the most recent Inpatient/ED visit.  Follow Up Plan: No further outreach attempts will be made at this time. We have been unable to contact the patient.  [employer], CMA

## 2024-06-13 ENCOUNTER — Telehealth: Payer: Self-pay

## 2024-06-13 NOTE — Transitions of Care (Post Inpatient/ED Visit) (Signed)
   06/13/2024  Name: [name] MRN: [id number] DOB: 1961/08/06  Today's TOC FU Call Status: Today's TOC FU Call Status:: Unsuccessful Call (1st Attempt) Unsuccessful Call (1st Attempt) Date: 06/13/24  Attempted to reach the patient regarding the most recent Inpatient/ED visit.  Follow Up Plan: Additional outreach attempts will be made to reach the patient to complete the Transitions of Care (Post Inpatient/ED visit) call.   [employer], CMA

## 2024-06-19 NOTE — Transitions of Care (Post Inpatient/ED Visit) (Signed)
   06/19/2024  Name: [name] MRN: [id number] DOB: [birth year]  Today's TOC FU Call Status: Today's TOC FU Call Status:: Unsuccessful Call (2nd Attempt) Unsuccessful Call (1st Attempt) Date: 06/13/24 Unsuccessful Call (2nd Attempt) Date: 06/19/24  Attempted to reach the patient regarding the most recent Inpatient/ED visit.  Follow Up Plan: No further outreach attempts will be made at this time. We have been unable to contact the patient.  [employer], CMA
